# Patient Record
Sex: Male | Born: 1960 | Race: Black or African American | Hispanic: No | Marital: Single | Smoking: Current some day smoker
Health system: Southern US, Community
[De-identification: ages and names within clinical notes are randomized; demographics above are authoritative.]

## PROBLEM LIST (undated history)

## (undated) DIAGNOSIS — R040 Epistaxis: Secondary | ICD-10-CM

## (undated) DIAGNOSIS — F32A Depression, unspecified: Secondary | ICD-10-CM

## (undated) DIAGNOSIS — N182 Chronic kidney disease, stage 2 (mild): Secondary | ICD-10-CM

## (undated) DIAGNOSIS — I10 Essential (primary) hypertension: Secondary | ICD-10-CM

## (undated) DIAGNOSIS — I422 Other hypertrophic cardiomyopathy: Secondary | ICD-10-CM

## (undated) DIAGNOSIS — M1990 Unspecified osteoarthritis, unspecified site: Secondary | ICD-10-CM

## (undated) HISTORY — DX: Unspecified osteoarthritis, unspecified site: M19.90

## (undated) HISTORY — DX: Chronic kidney disease, stage 2 (mild): N18.2

## (undated) HISTORY — DX: Epistaxis: R04.0

## (undated) HISTORY — DX: Depression, unspecified: F32.A

## (undated) HISTORY — DX: Essential (primary) hypertension: I10

## (undated) HISTORY — DX: Other hypertrophic cardiomyopathy: I42.2

## (undated) HISTORY — PX: NO PAST SURGERIES: SHX2092

---

## 2018-02-04 ENCOUNTER — Telehealth: Payer: Self-pay

## 2018-02-04 ENCOUNTER — Ambulatory Visit: Payer: Self-pay | Admitting: Internal Medicine

## 2018-02-04 ENCOUNTER — Encounter: Payer: Self-pay | Admitting: Internal Medicine

## 2018-02-04 VITALS — BP 185/111 | HR 69 | Wt 188.4 lb

## 2018-02-04 DIAGNOSIS — I10 Essential (primary) hypertension: Secondary | ICD-10-CM

## 2018-02-04 MED ORDER — HYDROCHLOROTHIAZIDE 25 MG PO TABS: 25.0000 mg | ORAL_TABLET | Freq: Every day | ORAL | 3 refills | Status: DC

## 2018-02-04 NOTE — Telephone Encounter (Signed)
Received email from [employer] [facility] alerting us that patient has a BP of 210/140.  Asked it we could work patient in.  [facility] Anaracy aware and patient notified to come in for an appointment.

## 2018-02-04 NOTE — Progress Notes (Signed)
   Subjective:    Patient ID: Edwin BaileyDaniel Frisch, male    DOB: 11/03/1961, 57 y.o.   MRN: [id number]  HPI   Pt has a personal and family history of high BP (his mother has high BP and his father died from a brain aneurysm). He is here for a f/u on high BP reading last night.  Pt has had surgery on both wrists due to injuries from his employment at [employer]. He also had a lymph node in this throat removed (cause is unknown).   Allergies as of 02/04/2018   No Known Allergies     Medication List    as of 02/04/2018 10:06 AM   You have not been prescribed any medications.    There are no active problems to display for this patient.    Review of Systems     Objective:   Physical Exam  Constitutional: He is oriented to person, place, and time.  Cardiovascular: Normal rate, regular rhythm and normal heart sounds.  Pulmonary/Chest: Effort normal and breath sounds normal.  Neurological: He is alert and oriented to person, place, and time.    BP (!) 185/111   Pulse 69   Wt 188 lb 6.4 oz (85.5 kg)       Assessment & Plan:   Suggested to pt that he should take medication for high BP. He is willing to take medication and hydrochlorothiazide was prescribed.   Labs ordered today: CMET, CBC, UA, TSH, lipid panel)  F/u in 6 weeks for medication and labs.

## 2018-02-05 LAB — LIPID PANEL
CHOL/HDL RATIO: 2.6 ratio (ref 0.0–5.0)
Cholesterol, Total: 171 mg/dL (ref 100–199)
HDL: 66 mg/dL (ref >39)
LDL CALC: 96 mg/dL (ref 0–99)
Triglycerides: 43 mg/dL (ref 0–149)
VLDL Cholesterol Cal: 9 mg/dL (ref 5–40)

## 2018-02-05 LAB — COMPREHENSIVE METABOLIC PANEL
ALBUMIN: 4.6 g/dL (ref 3.5–5.5)
ALT: 13 IU/L (ref 0–44)
AST: 16 IU/L (ref 0–40)
Albumin/Globulin Ratio: 1.9 (ref 1.2–2.2)
Alkaline Phosphatase: 63 IU/L (ref 39–117)
BUN / CREAT RATIO: 12 (ref 9–20)
BUN: 16 mg/dL (ref 6–24)
Bilirubin Total: 0.4 mg/dL (ref 0.0–1.2)
CO2: 26 mmol/L (ref 20–29)
CREATININE: 1.29 mg/dL — AB (ref 0.76–1.27)
Calcium: 9.2 mg/dL (ref 8.7–10.2)
Chloride: 104 mmol/L (ref 96–106)
GFR calc non Af Amer: 62 mL/min/{1.73_m2} (ref >59)
GFR, EST AFRICAN AMERICAN: 72 mL/min/{1.73_m2} (ref >59)
GLOBULIN, TOTAL: 2.4 g/dL (ref 1.5–4.5)
GLUCOSE: 96 mg/dL (ref 65–99)
Potassium: 4.8 mmol/L (ref 3.5–5.2)
SODIUM: 141 mmol/L (ref 134–144)
TOTAL PROTEIN: 7 g/dL (ref 6.0–8.5)

## 2018-02-05 LAB — CBC
HEMATOCRIT: 40.1 % (ref 37.5–51.0)
Hemoglobin: 13.5 g/dL (ref 13.0–17.7)
MCH: 30.4 pg (ref 26.6–33.0)
MCHC: 33.7 g/dL (ref 31.5–35.7)
MCV: 90 fL (ref 79–97)
PLATELETS: 218 10*3/uL (ref 150–379)
RBC: 4.44 x10E6/uL (ref 4.14–5.80)
RDW: 14.7 % (ref 12.3–15.4)
WBC: 4.1 10*3/uL (ref 3.4–10.8)

## 2018-02-05 LAB — URINALYSIS
Bilirubin, UA: NEGATIVE
GLUCOSE, UA: NEGATIVE
Ketones, UA: NEGATIVE
LEUKOCYTES UA: NEGATIVE
Nitrite, UA: NEGATIVE
PROTEIN UA: NEGATIVE
RBC, UA: NEGATIVE
Specific Gravity, UA: 1.015 (ref 1.005–1.030)
Urobilinogen, Ur: 0.2 mg/dL (ref 0.2–1.0)
pH, UA: 6 (ref 5.0–7.5)

## 2018-02-05 LAB — TSH: TSH: 2.9 u[IU]/mL (ref 0.450–4.500)

## 2018-02-16 ENCOUNTER — Ambulatory Visit: Payer: Self-pay | Admitting: Pharmacy Technician

## 2018-02-16 DIAGNOSIS — Z79899 Other long term (current) drug therapy: Secondary | ICD-10-CM

## 2018-02-16 NOTE — Progress Notes (Signed)
Completed [facility] application and contract.  Patient agreed to all terms of the [facility] contract.    Patient approved to receive medication assistance at [facility] through 2019, as long as eligibility criteria continues to be met.    Provided patient with Civil engineer, contracting based on his particular needs.    Patient inquiring about [facility]'s ability to fill narcotics.  Explained that [facility] cannot fill or dispense narcotics or controlled substances.  Patient acknowledged that he understood.  Patient asked that I contact [place] about prescribing 800 mg Aspirin.  Sending note to [facility].   [facility]

## 2018-03-18 ENCOUNTER — Ambulatory Visit: Payer: Self-pay | Admitting: Internal Medicine

## 2018-03-19 ENCOUNTER — Ambulatory Visit: Payer: Self-pay | Admitting: Adult Health

## 2018-03-19 ENCOUNTER — Encounter: Payer: Self-pay | Admitting: Adult Health

## 2018-03-19 VITALS — BP 200/117 | HR 65 | Temp 98.2°F | Ht 73.0 in | Wt 196.4 lb

## 2018-03-19 DIAGNOSIS — I10 Essential (primary) hypertension: Secondary | ICD-10-CM

## 2018-03-19 DIAGNOSIS — N182 Chronic kidney disease, stage 2 (mild): Secondary | ICD-10-CM

## 2018-03-19 DIAGNOSIS — F17200 Nicotine dependence, unspecified, uncomplicated: Secondary | ICD-10-CM

## 2018-03-19 DIAGNOSIS — F17209 Nicotine dependence, unspecified, with unspecified nicotine-induced disorders: Secondary | ICD-10-CM

## 2018-03-19 DIAGNOSIS — I129 Hypertensive chronic kidney disease with stage 1 through stage 4 chronic kidney disease, or unspecified chronic kidney disease: Secondary | ICD-10-CM

## 2018-03-19 DIAGNOSIS — N181 Chronic kidney disease, stage 1: Secondary | ICD-10-CM

## 2018-03-19 MED ORDER — HYDROCHLOROTHIAZIDE 50 MG PO TABS: 50.0000 mg | ORAL_TABLET | Freq: Every day | ORAL | 3 refills | Status: DC

## 2018-03-19 MED ORDER — LISINOPRIL 20 MG PO TABS: 20.0000 mg | ORAL_TABLET | Freq: Every day | ORAL | 3 refills | Status: DC

## 2018-03-19 NOTE — Progress Notes (Signed)
  Patient: Edwin Green Male    DOB: 01/03/1961   57 y.o.   MRN: [id number] Visit Date: 03/19/2018  Today's Provider: Tally JoeMagddalene S Tukov, NP   Chief Complaint  Patient presents with  . Follow-up    hypertension  Uncontrolled hypertension Subjective:    HPI   This is a 57 y/o AA male who presents for evaluation of hypertension. His blood pressure is running high with readings >180/90. Today his systolic BP is in the 200s. He reports running out of his blood pressure medications 1 week ago but even when he was taking the medications, it was still high. He denies headache, dizziness, visual changes, nausea and vomiting. His last labs were unremarkable except for a creatinine level of 1.29. His GFR was normal. He reports consistent daily exercise and adherence to a heart healthy diet. He continue to smoke occasionally  No Known Allergies Previous Medications   HYDROCHLOROTHIAZIDE (HYDRODIURIL) 25 MG TABLET    Take 1 tablet (25 mg total) by mouth daily.   Social History   Tobacco Use  . Smoking status: Current Some Day Smoker    Types: Cigarettes  . Smokeless tobacco: Never Used  Substance Use Topics  . Alcohol use: Yes     Review of Systems  Constitutional: Negative.   HENT: Negative.   Eyes: Negative.   Respiratory: Negative.   Cardiovascular: Negative.   Skin: Negative.   Neurological: Negative.     Objective:   BP (!) 200/117   Pulse 65   Temp 98.2 F (36.8 C)   Ht 6\' 1"  (1.854 m)   Wt 196 lb 6.4 oz (89.1 kg)   BMI 25.91 kg/m   Physical Exam  Constitutional: He is oriented to person, place, and time. He appears well-developed and well-nourished.  HENT:  Mouth/Throat: Oropharynx is clear and moist.  Eyes: Pupils are equal, round, and reactive to light. Conjunctivae are normal.  Neck: Normal range of motion. Neck supple.  Cardiovascular: Normal rate, regular rhythm and normal heart sounds.  Pulmonary/Chest: Effort normal and breath sounds normal.  Abdominal:  Soft. Bowel sounds are normal.  Musculoskeletal: Normal range of motion.  Neurological: He is alert and oriented to person, place, and time.  Skin: Skin is warm and dry.      Assessment & Plan:     1. Uncontrolled hypertension: Will increase hydrochlorothiazide (HYDRODIURIL) to 50 MG daily to be taken in the morning and add lisinopril 20 mg QPM. Patient advised to stop lisinopril if cough, lips and tongue swelling occurs. Patient advised to avoid foods with >2g sodium and avoid added salt to meals. Continue daily exercise and adhere o prescribed medications and health healthy diet  2. Chronic kidney disease (CKD) stage G2/A1, mildly decreased glomerular filtration rate (GFR) between 60-89 mL/min/1.73 square meter and albuminuria creatinine ratio less than 30 mg/g  Patient is at increased risk for renal failure. Will optimize blood pressure control and treat creatinine and microalbuminuria.   3. Tobacco use disorder Smoking cessation advice given  RTC in 1 week for blood pressure re-check.  Labs in 6 months   Tally JoeMagddalene S Tukov, NP   [facility] [place]

## 2018-03-19 NOTE — Patient Instructions (Addendum)
Chronic Kidney Disease, Adult Chronic kidney disease (CKD) happens when the kidneys are damaged during a time of 3 or more months. The kidneys are two organs that do many important jobs in the body. These jobs include:  Removing wastes and extra fluids from the blood.  Making hormones that maintain the amount of fluid in your tissues and blood vessels.  Making sure that the body has the right amount of fluids and chemicals.  Most of the time, this condition does not go away, but it can usually be controlled. Steps must be taken to slow down the kidney damage or stop it from getting worse. Otherwise, the kidneys may stop working. Follow these instructions at home:  Follow your diet as told by your doctor. You may need to avoid alcohol, salty foods (sodium), and foods that are high in potassium, calcium, and protein.  Take over-the-counter and prescription medicines only as told by your doctor. Do not take any new medicines unless your doctor says you can do that. These include vitamins and minerals. ? Medicines and nutritional supplements can make kidney damage worse. ? Your doctor may need to change how much medicine you take.  Do not use any tobacco products. These include cigarettes, chewing tobacco, and e-cigarettes. If you need help quitting, ask your doctor.  Keep all follow-up visits as told by your doctor. This is important.  Check your blood pressure. Tell your doctor if there are changes to your blood pressure.  Get to a healthy weight. Stay at that weight. If you need help with this, ask your doctor.  Start or continue an exercise plan. Try to exercise at least 30 minutes a day, 5 days a week.  Stay up-to-date with your shots (immunizations) as told by your doctor. Contact a doctor if:  Your symptoms get worse.  You have new symptoms. Get help right away if:  You have symptoms of end-stage kidney disease. These include: ? Headaches. ? Skin that is darker or lighter  than normal. ? Numbness in your hands or feet. ? Easy bruising. ? Having hiccups often. ? Chest pain. ? Shortness of breath. ? Stopping of menstrual periods in women.  You have a fever.  You are making very little pee (urine).  You have pain or bleeding when you pee (urinate). This information is not intended to replace advice given to you by your health care provider. Make sure you discuss any questions you have with your health care provider. Document Released: 02/26/2010 Document Revised: 05/09/2016 Document Reviewed: 07/31/2012 Elsevier Interactive Patient Education  2017 [employer]. Hypertension Hypertension is another name for high blood pressure. High blood pressure forces your heart to work harder to pump blood. This can cause problems over time. There are two numbers in a blood pressure reading. There is a top number (systolic) over a bottom number (diastolic). It is best to have a blood pressure below 120/80. Healthy choices can help lower your blood pressure. You may need medicine to help lower your blood pressure if:  Your blood pressure cannot be lowered with healthy choices.  Your blood pressure is higher than 130/80.  Follow these instructions at home: Eating and drinking  If directed, follow the DASH eating plan. This diet includes: ? Filling half of your plate at each meal with fruits and vegetables. ? Filling one quarter of your plate at each meal with whole grains. Whole grains include whole wheat pasta, brown rice, and whole grain bread. ? Eating or drinking low-fat dairy products,  such as skim milk or low-fat yogurt. ? Filling one quarter of your plate at each meal with low-fat (lean) proteins. Low-fat proteins include fish, skinless chicken, eggs, beans, and tofu. ? Avoiding fatty meat, cured and processed meat, or chicken with skin. ? Avoiding premade or processed food.  Eat less than 1,500 mg of salt (sodium) a day.  Limit alcohol use to no more than 1  drink a day for nonpregnant women and 2 drinks a day for men. One drink equals 12 oz of beer, 5 oz of wine, or 1 oz of hard liquor. Lifestyle  Work with your doctor to stay at a healthy weight or to lose weight. Ask your doctor what the best weight is for you.  Get at least 30 minutes of exercise that causes your heart to beat faster (aerobic exercise) most days of the week. This may include walking, swimming, or biking.  Get at least 30 minutes of exercise that strengthens your muscles (resistance exercise) at least 3 days a week. This may include lifting weights or pilates.  Do not use any products that contain nicotine or tobacco. This includes cigarettes and e-cigarettes. If you need help quitting, ask your doctor.  Check your blood pressure at home as told by your doctor.  Keep all follow-up visits as told by your doctor. This is important. Medicines  Take over-the-counter and prescription medicines only as told by your doctor. Follow directions carefully.  Do not skip doses of blood pressure medicine. The medicine does not work as well if you skip doses. Skipping doses also puts you at risk for problems.  Ask your doctor about side effects or reactions to medicines that you should watch for. Contact a doctor if:  You think you are having a reaction to the medicine you are taking.  You have headaches that keep coming back (recurring).  You feel dizzy.  You have swelling in your ankles.  You have trouble with your vision. Get help right away if:  You get a very bad headache.  You start to feel confused.  You feel weak or numb.  You feel faint.  You get very bad pain in your: ? Chest. ? Belly (abdomen).  You throw up (vomit) more than once.  You have trouble breathing. Summary  Hypertension is another name for high blood pressure.  Making healthy choices can help lower blood pressure. If your blood pressure cannot be controlled with healthy choices, you may  need to take medicine. This information is not intended to replace advice given to you by your health care provider. Make sure you discuss any questions you have with your health care provider. Document Released: 05/20/2008 Document Revised: 10/30/2016 Document Reviewed: 10/30/2016 Elsevier Interactive Patient Education  [employer].

## 2018-03-26 ENCOUNTER — Ambulatory Visit: Payer: Self-pay | Admitting: Adult Health

## 2018-04-07 ENCOUNTER — Other Ambulatory Visit: Payer: Self-pay

## 2018-04-07 ENCOUNTER — Emergency Department
Admission: EM | Admit: 2018-04-07 | Discharge: 2018-04-07 | Disposition: A | Discharge status: Home / Self Care | Payer: Self-pay | Attending: Emergency Medicine | Admitting: Emergency Medicine

## 2018-04-07 ENCOUNTER — Emergency Department: Payer: Self-pay

## 2018-04-07 DIAGNOSIS — I10 Essential (primary) hypertension: Secondary | ICD-10-CM | POA: Insufficient documentation

## 2018-04-07 DIAGNOSIS — N289 Disorder of kidney and ureter, unspecified: Secondary | ICD-10-CM | POA: Insufficient documentation

## 2018-04-07 DIAGNOSIS — R55 Syncope and collapse: Secondary | ICD-10-CM | POA: Insufficient documentation

## 2018-04-07 DIAGNOSIS — F17210 Nicotine dependence, cigarettes, uncomplicated: Secondary | ICD-10-CM | POA: Insufficient documentation

## 2018-04-07 DIAGNOSIS — I959 Hypotension, unspecified: Secondary | ICD-10-CM | POA: Insufficient documentation

## 2018-04-07 LAB — BASIC METABOLIC PANEL
ANION GAP: 3 — AB (ref 5–15)
BUN: 23 mg/dL — ABNORMAL HIGH (ref 6–20)
CHLORIDE: 117 mmol/L — AB (ref 101–111)
CO2: 23 mmol/L (ref 22–32)
Calcium: 6.5 mg/dL — ABNORMAL LOW (ref 8.9–10.3)
Creatinine, Ser: 1.44 mg/dL — ABNORMAL HIGH (ref 0.61–1.24)
GFR, EST NON AFRICAN AMERICAN: 54 mL/min — AB (ref >60)
Glucose, Bld: 109 mg/dL — ABNORMAL HIGH (ref 65–99)
POTASSIUM: 3.5 mmol/L (ref 3.5–5.1)
Sodium: 143 mmol/L (ref 135–145)

## 2018-04-07 LAB — CBC
HEMATOCRIT: 38.5 % — AB (ref 40.0–52.0)
HEMOGLOBIN: 12.9 g/dL — AB (ref 13.0–18.0)
MCH: 30.4 pg (ref 26.0–34.0)
MCHC: 33.4 g/dL (ref 32.0–36.0)
MCV: 90.9 fL (ref 80.0–100.0)
Platelets: 145 10*3/uL — ABNORMAL LOW (ref 150–440)
RBC: 4.24 MIL/uL — ABNORMAL LOW (ref 4.40–5.90)
RDW: 14.4 % (ref 11.5–14.5)
WBC: 6.9 10*3/uL (ref 3.8–10.6)

## 2018-04-07 LAB — GLUCOSE, CAPILLARY: GLUCOSE-CAPILLARY: 106 mg/dL — AB (ref 65–99)

## 2018-04-07 MED ORDER — SODIUM CHLORIDE 0.9 % IV BOLUS
1000.0000 mL | Freq: Once | INTRAVENOUS | Status: AC
  Administered 2018-04-07: 1000 mL via INTRAVENOUS

## 2018-04-07 NOTE — Discharge Instructions (Addendum)
Please drink plenty of fluids to stay well-hydrated.  Please take all precautions to prevent fainting including slow movements from laying down to sitting and standing positions.  Please [facility]  Please have them recheck your kidney function.  Return to the emergency department if you develop severe pain, chest pain, shortness of breath, lightheadedness or fainting, fever, or any other symptoms concerning to you.

## 2018-04-07 NOTE — ED Triage Notes (Signed)
Pt arrived via EMS from home d/t syncopal episode after giving plasma today around 1530. Pt walked home after giving blood and began to feel dizzy and had a syncopal episode once he got home. Pt reports hitting the back of his head but denies ever losing complete consciousness. Pt is A&O x4 at this time.

## 2018-04-07 NOTE — ED Provider Notes (Signed)
[facility] Emergency Department Provider Note  ____________________________________________  Time seen: Approximately 6:52 PM  I have reviewed the triage vital signs and the nursing notes.   HISTORY  Chief Complaint Loss of Consciousness    HPI Edwin Green is a 57 y.o. male a history of hypertension presenting for syncope.  The patient reports that today is his third time donating plasma for the third week in a row.  Upon arrival home, the patient became lightheaded and had a brief loss of consciousness without any urinary or fecal incontinence are reported tonic-clonic movements.  He did fall backwards and struck his neck.  He denies any associated neck or back pain but states "I know something happened to my neck when I fell backwards."  He also reports chronic bilateral wrist pain which is unchanged and denies any injury from his fall.  He did not have any associated chest pain, palpitations, shortness of breath, and has not had any lower extremity swelling or calf pain.   Past Medical History:  Diagnosis Date  . Hypertension     There are no active problems to display for this patient.   History reviewed. No pertinent surgical history.  Current Outpatient Rx  . Order #: [id number] Class: Normal  . Order #: [id number] Class: Normal    Allergies Patient has no known allergies.  Family History  Problem Relation Age of Onset  . Hypertension Mother   . Stroke Father   . Hypertension Father   . Asthma Son     Social History Social History   Tobacco Use  . Smoking status: Current Some Day Smoker    Types: Cigarettes  . Smokeless tobacco: Never Used  Substance Use Topics  . Alcohol use: Yes  . Drug use: No    Review of Systems Constitutional: No fever/chills.  Positive lightheadedness and syncope. Eyes: No visual changes. ENT: No sore throat. No congestion or rhinorrhea. Cardiovascular: Denies chest pain. Denies  palpitations. Respiratory: Denies shortness of breath.  No cough. Gastrointestinal: No abdominal pain.  No nausea, no vomiting.  No diarrhea.  No constipation. Genitourinary: Negative for dysuria. Musculoskeletal: Negative for back pain.  Positive for possible neck injury without pain at this time.  Positive chronic unchanged bilateral wrist pain. Skin: Negative for rash. Neurological: Negative for headaches. No focal numbness, tingling or weakness.     ____________________________________________   PHYSICAL EXAM:  VITAL SIGNS: ED Triage Vitals  Enc Vitals Group     BP 04/07/18 1807 102/72     Pulse Rate 04/07/18 1807 73     Resp 04/07/18 1807 12     Temp 04/07/18 1807 97.7 F (36.5 C)     Temp Source 04/07/18 1807 Oral     SpO2 04/07/18 1807 98 %     Weight 04/07/18 1804 195 lb (88.5 kg)     Height 04/07/18 1804 6\' 1"  (1.854 m)     Head Circumference --      Peak Flow --      Pain Score 04/07/18 1804 8     Pain Loc --      Pain Edu? --      Excl. in GC? --     Constitutional: Alert and oriented. Well appearing and in no acute distress. Answers questions appropriately.  GCS is 15. Eyes: Conjunctivae are normal.  EOMI. No scleral icterus.  No raccoon eyes  head: Atraumatic.  No battle sign. Nose: No congestion/rhinnorhea.  No swelling over the nose.  No septal hematoma.  Mouth/Throat: Mucous membranes are moist.  No dental injury or malocclusion. Neck: No stridor.  Supple.  No midline C-spine tenderness to palpation, step-offs or deformities. Cardiovascular: Normal rate, regular rhythm. No murmurs, rubs or gallops.  Blood pressure in the high 90s over 60s on my examination. Respiratory: Normal respiratory effort.  No accessory muscle use or retractions. Lungs CTAB.  No wheezes, rales or ronchi. Gastrointestinal: Soft, nontender and nondistended.  No guarding or rebound.  No peritoneal signs. Musculoskeletal: No LE edema. No ttp in the calves or palpable cords.  Negative  Homan's sign. Neurologic:  A&Ox3.  Speech is clear.  Face and smile are symmetric.  EOMI.  Moves all extremities well. Skin:  Skin is warm, dry and intact. No rash noted. Psychiatric: Mood is normal and affect is bizarre.  Speech and behavior are normal.  }  ____________________________________________   LABS (all labs ordered are listed, but only abnormal results are displayed)  Labs Reviewed  CBC - Abnormal; Notable for the following components:      Result Value   RBC 4.24 (*)    Hemoglobin 12.9 (*)    HCT 38.5 (*)    Platelets 145 (*)    All other components within normal limits  BASIC METABOLIC PANEL - Abnormal; Notable for the following components:   Chloride 117 (*)    Glucose, Bld 109 (*)    BUN 23 (*)    Creatinine, Ser 1.44 (*)    Calcium 6.5 (*)    GFR calc non Af Amer 54 (*)    Anion gap 3 (*)    All other components within normal limits  GLUCOSE, CAPILLARY - Abnormal; Notable for the following components:   Glucose-Capillary 106 (*)    All other components within normal limits  CBG MONITORING, ED   ____________________________________________  EKG  ED ECG REPORT I, Rockne Menghini, the attending physician, personally viewed and interpreted this ECG.   Date: 04/07/2018  EKG Time: 1810  Rate: 73  Rhythm: normal sinus rhythm  Axis: normal  Intervals:none  ST&T Change: No STEMI  ____________________________________________  RADIOLOGY  Dg Chest 2 View  Result Date: 04/07/2018 CLINICAL DATA:  Syncopal episode. EXAM: CHEST - 2 VIEW COMPARISON:  None. FINDINGS: The heart size and mediastinal contours are within normal limits. Both lungs are clear. The visualized skeletal structures are unremarkable. IMPRESSION: No active cardiopulmonary disease. Electronically Signed   By: Gerome Sam III M.D   On: 04/07/2018 19:13   Dg Cervical Spine Complete  Result Date: 04/07/2018 CLINICAL DATA:  Pain after fall.  Syncope. EXAM: CERVICAL SPINE - COMPLETE 4+  VIEW COMPARISON:  None. FINDINGS: Evaluation is limited on lateral views as the cervical spine is only well seen to the level of the mid C7 level. The pre odontoid space and prevertebral soft tissues are normal. No fractures are seen. Multilevel degenerative changes with small anterior osteophytes. The neural foramina are patent. Limited views of the odontoid process are normal. No other acute abnormalities. IMPRESSION: The cervical spine is only seen to the mid C7 level. Within this limitation, there are mild degenerative changes but no fractures or traumatic malalignment identified. Electronically Signed   By: Gerome Sam III M.D   On: 04/07/2018 19:15    ____________________________________________   PROCEDURES  Procedure(s) performed: None  Procedures  Critical Care performed: No ____________________________________________   INITIAL IMPRESSION / ASSESSMENT AND PLAN / ED COURSE  Pertinent labs & imaging results that were available during my care of the patient were  reviewed by me and considered in my medical decision making (see chart for details).  57 y.o. male with a history of hypertension presenting with syncope after donating plasma today.  Overall, the patient is mildly hypotensive but otherwise hemodynamically stable.  He has no focal evidence of injury on my examination today but will get a x-ray of his neck given his concern about neck injury and chest x-ray for evaluation of his cardiopulmonary status.  An EKG has been performed and does not show any arrhythmia or ischemia.  Basic laboratory studies including blood work looking for anemia has been ordered.   ACS or MI, arrhythmia, PE, aortic pathology, or CVA are very unlikely.  The most likely etiology of the patient's symptoms is either vasovagal or hypovolemia.  We will get orthostatics and treat the patient with intravenous fluids.  Plan reevaluation for final disposition.  ----------------------------------------- 9:10  PM on 04/07/2018 -----------------------------------------  The patient continues to rest comfortably and is hemodynamically stable.  His workup in the emergency department has been reassuring.  He does have a mild renal insufficiency although at baseline he has an abnormal kidney function I have encouraged him to continue to drink plenty of fluid and have this rechecked with his primary care physician.  His trauma evaluation is negative for any cervical spine injury.  His chest x-ray is also reassuring.  His EKG does not show ischemic changes.  At this time, the patient is safe for discharge home.  Discussed return precautions as well as follow-up instructions with the patient  ____________________________________________  FINAL CLINICAL IMPRESSION(S) / ED DIAGNOSES  Final diagnoses:  Acute renal insufficiency  Syncope, unspecified syncope type         NEW MEDICATIONS STARTED DURING THIS VISIT:  New Prescriptions   No medications on file      Rockne Menghini, MD 04/07/18 2113

## 2019-02-24 IMAGING — CR DG CERVICAL SPINE COMPLETE 4+V
6 series · 6 of 6 positions shown · non-contrast
Comparison: None.

CLINICAL DATA: Pain after fall.  Syncope.

EXAM:
CERVICAL SPINE - COMPLETE 4+ VIEW

[c-spine lat]
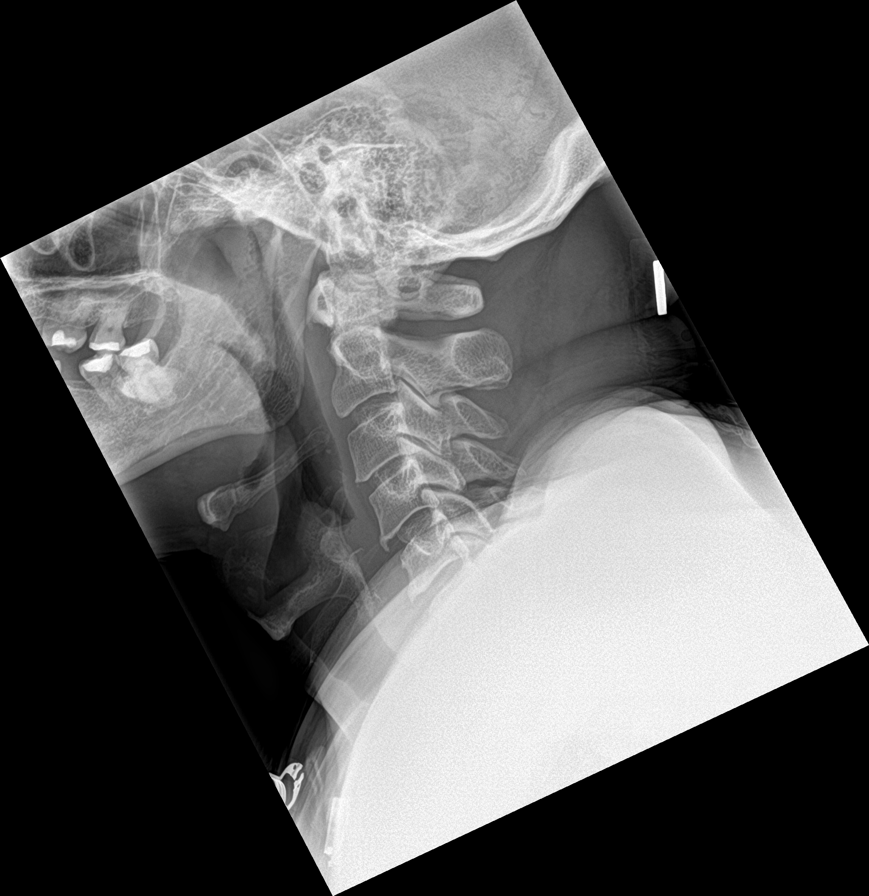

[c-spine obl (1 of 2)]
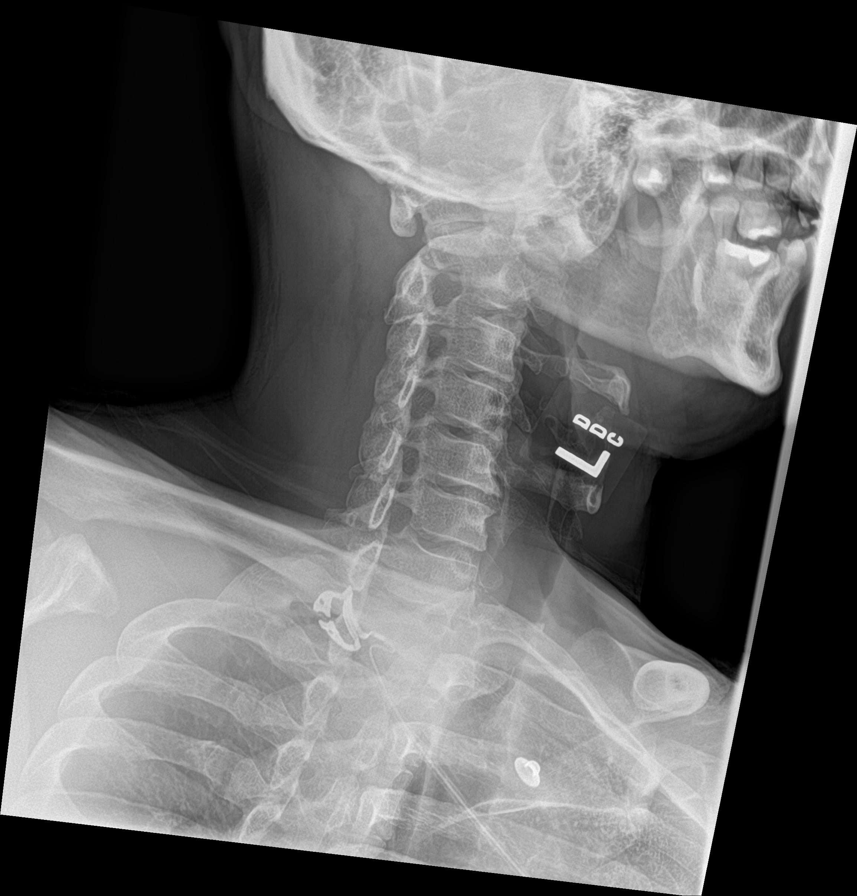

[c-spine obl (2 of 2)]
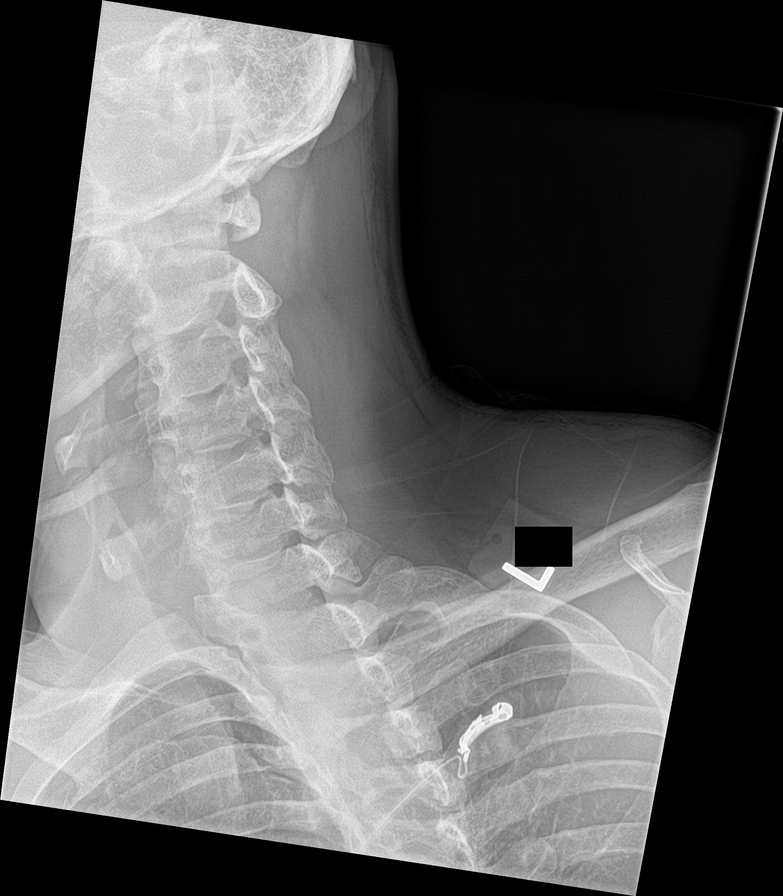

[c-spine ap]
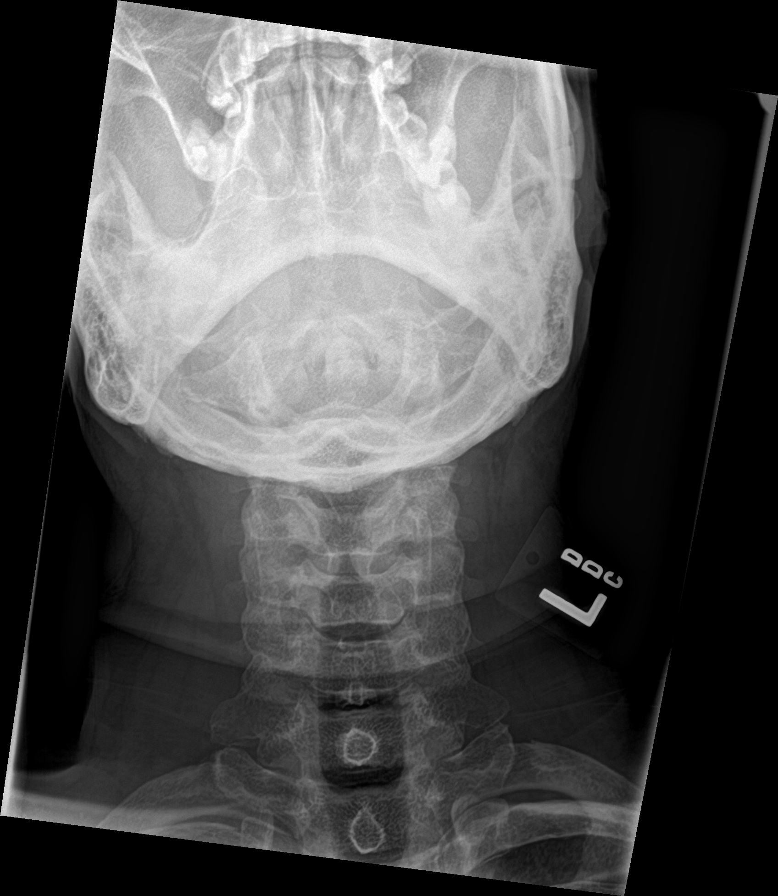

[c-spine open mouth]
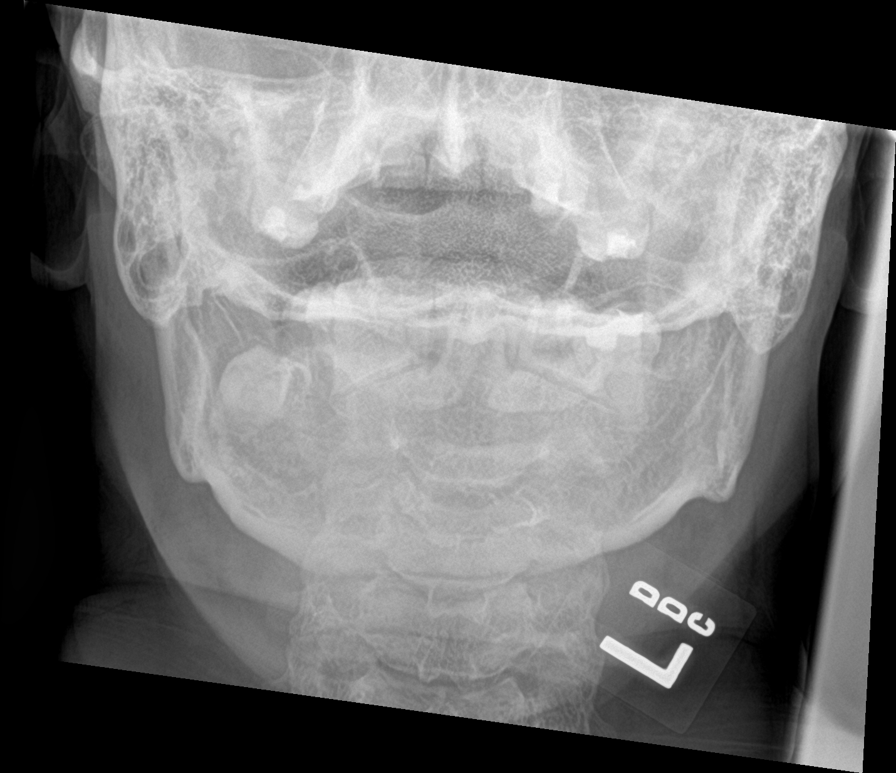

[c-spine swimmers]
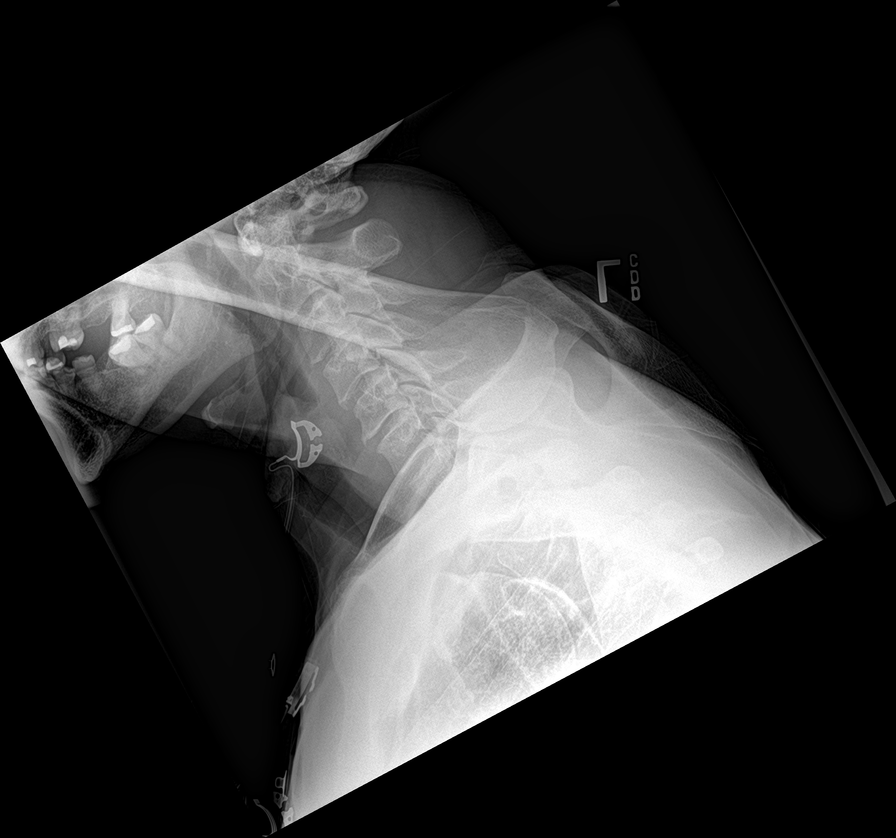

[6 of 6 positions shown; findings below may reference images not displayed]

FINDINGS: Evaluation is limited on lateral views as the cervical spine is only
well seen to the level of the mid C7 level. The pre odontoid space
and prevertebral soft tissues are normal. No fractures are seen.
Multilevel degenerative changes with small anterior osteophytes. The
neural foramina are patent. Limited views of the odontoid process
are normal. No other acute abnormalities.
IMPRESSION: The cervical spine is only seen to the mid C7 level. Within this
limitation, there are mild degenerative changes but no fractures or
traumatic malalignment identified.

## 2019-06-23 ENCOUNTER — Inpatient Hospital Stay
Admission: EM | Admit: 2019-06-23 | Discharge: 2019-06-26 | DRG: 305 | Disposition: A | Discharge status: Home / Self Care | Payer: No Typology Code available for payment source | Attending: Internal Medicine | Admitting: Internal Medicine

## 2019-06-23 ENCOUNTER — Other Ambulatory Visit: Payer: Self-pay

## 2019-06-23 ENCOUNTER — Emergency Department: Payer: No Typology Code available for payment source

## 2019-06-23 ENCOUNTER — Encounter: Payer: Self-pay | Admitting: Emergency Medicine

## 2019-06-23 DIAGNOSIS — Z8673 Personal history of transient ischemic attack (TIA), and cerebral infarction without residual deficits: Secondary | ICD-10-CM

## 2019-06-23 DIAGNOSIS — Z9114 Patient's other noncompliance with medication regimen: Secondary | ICD-10-CM

## 2019-06-23 DIAGNOSIS — I248 Other forms of acute ischemic heart disease: Secondary | ICD-10-CM | POA: Diagnosis present

## 2019-06-23 DIAGNOSIS — Z823 Family history of stroke: Secondary | ICD-10-CM

## 2019-06-23 DIAGNOSIS — E860 Dehydration: Secondary | ICD-10-CM | POA: Diagnosis present

## 2019-06-23 DIAGNOSIS — Z825 Family history of asthma and other chronic lower respiratory diseases: Secondary | ICD-10-CM

## 2019-06-23 DIAGNOSIS — E869 Volume depletion, unspecified: Secondary | ICD-10-CM | POA: Diagnosis present

## 2019-06-23 DIAGNOSIS — I160 Hypertensive urgency: Secondary | ICD-10-CM | POA: Diagnosis not present

## 2019-06-23 DIAGNOSIS — Z8249 Family history of ischemic heart disease and other diseases of the circulatory system: Secondary | ICD-10-CM

## 2019-06-23 DIAGNOSIS — R040 Epistaxis: Secondary | ICD-10-CM | POA: Diagnosis not present

## 2019-06-23 DIAGNOSIS — I421 Obstructive hypertrophic cardiomyopathy: Secondary | ICD-10-CM | POA: Diagnosis present

## 2019-06-23 DIAGNOSIS — F419 Anxiety disorder, unspecified: Secondary | ICD-10-CM | POA: Diagnosis present

## 2019-06-23 DIAGNOSIS — R55 Syncope and collapse: Secondary | ICD-10-CM | POA: Diagnosis present

## 2019-06-23 DIAGNOSIS — I10 Essential (primary) hypertension: Secondary | ICD-10-CM | POA: Diagnosis present

## 2019-06-23 DIAGNOSIS — N179 Acute kidney failure, unspecified: Secondary | ICD-10-CM | POA: Diagnosis present

## 2019-06-23 DIAGNOSIS — F17210 Nicotine dependence, cigarettes, uncomplicated: Secondary | ICD-10-CM | POA: Diagnosis present

## 2019-06-23 DIAGNOSIS — Z20828 Contact with and (suspected) exposure to other viral communicable diseases: Secondary | ICD-10-CM | POA: Diagnosis present

## 2019-06-23 LAB — BASIC METABOLIC PANEL
Anion gap: 8 (ref 5–15)
BUN: 24 mg/dL — ABNORMAL HIGH (ref 6–20)
CO2: 24 mmol/L (ref 22–32)
Calcium: 8.6 mg/dL — ABNORMAL LOW (ref 8.9–10.3)
Chloride: 108 mmol/L (ref 98–111)
Creatinine, Ser: 1.45 mg/dL — ABNORMAL HIGH (ref 0.61–1.24)
GFR calc Af Amer: >60 mL/min (ref >60)
GFR calc non Af Amer: 54 mL/min — ABNORMAL LOW (ref >60)
Glucose, Bld: 138 mg/dL — ABNORMAL HIGH (ref 70–99)
Potassium: 3.7 mmol/L (ref 3.5–5.1)
Sodium: 140 mmol/L (ref 135–145)

## 2019-06-23 LAB — CBC WITH DIFFERENTIAL/PLATELET
Abs Immature Granulocytes: 0.01 10*3/uL (ref 0.00–0.07)
Basophils Absolute: 0.1 10*3/uL (ref 0.0–0.1)
Basophils Relative: 1 %
Eosinophils Absolute: 0.2 10*3/uL (ref 0.0–0.5)
Eosinophils Relative: 4 %
HCT: 37.1 % — ABNORMAL LOW (ref 39.0–52.0)
Hemoglobin: 12.4 g/dL — ABNORMAL LOW (ref 13.0–17.0)
Immature Granulocytes: 0 %
Lymphocytes Relative: 36 %
Lymphs Abs: 1.7 10*3/uL (ref 0.7–4.0)
MCH: 30.4 pg (ref 26.0–34.0)
MCHC: 33.4 g/dL (ref 30.0–36.0)
MCV: 90.9 fL (ref 80.0–100.0)
Monocytes Absolute: 0.5 10*3/uL (ref 0.1–1.0)
Monocytes Relative: 10 %
Neutro Abs: 2.3 10*3/uL (ref 1.7–7.7)
Neutrophils Relative %: 49 %
Platelets: 196 10*3/uL (ref 150–400)
RBC: 4.08 MIL/uL — ABNORMAL LOW (ref 4.22–5.81)
RDW: 13.7 % (ref 11.5–15.5)
WBC: 4.7 10*3/uL (ref 4.0–10.5)
nRBC: 0 % (ref 0.0–0.2)

## 2019-06-23 MED ORDER — HYDRALAZINE HCL 20 MG/ML IJ SOLN
10.0000 mg | Freq: Once | INTRAMUSCULAR | Status: AC
  Administered 2019-06-23: 10 mg via INTRAVENOUS
  Filled 2019-06-23: qty 1

## 2019-06-23 NOTE — ED Provider Notes (Signed)
[facility] Emergency Department Provider Note   ____________________________________________   First MD Initiated Contact with Patient 06/23/19 2328     (approximate)  I have reviewed the triage vital signs and the nursing notes.   HISTORY  Chief Complaint Hypertension and Epistaxis    HPI Edwin Green is a 58 y.o. male brought to the ED from work at [employer] via EMS with a chief complaint of hypertension and epistaxis.  Patient has a history of hypertension but has not taken his medicines in over a year because he felt like they were not working.  He is not prone to nosebleeds.  Reports 3 episodes of left-sided epistaxis today lasting approximately 20 minutes each and controlled by direct pressure.  Denies headache, fever, cough, chest pain, shortness of breath, abdominal pain, nausea or vomiting.  Denies anticoagulant use.  Denies recent travel, trauma or exposure to persons diagnosed with coronavirus.  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       Past Medical History:  Diagnosis Date  . Hypertension     Patient Active Problem List   Diagnosis Date Noted  . Accelerated hypertension 06/24/2019  . Nosebleed 06/24/2019  . Hypertensive urgency 06/24/2019    Past Surgical History:  Procedure Laterality Date  . NO PAST SURGERIES      Prior to Admission medications   Medication Sig Start Date End Date Taking? Authorizing Provider  hydrochlorothiazide (HYDRODIURIL) 50 MG tablet Take 1 tablet (50 mg total) by mouth daily. Patient not taking: Reported on 06/24/2019 03/19/18   Tukov-Yual, Alroy BailiffMagdalene S, NP  lisinopril (PRINIVIL,ZESTRIL) 20 MG tablet Take 1 tablet (20 mg total) by mouth daily. 03/19/18 04/18/18  Andreas Ohmukov-Yual, Magdalene S, NP    Allergies Patient has no known allergies.  Family History  Problem Relation Age of Onset  . Hypertension Mother   . Stroke Father   .  Hypertension Father   . Asthma Son     Social History Social History   Tobacco Use  . Smoking status: Current Some Day Smoker    Types: Cigarettes  . Smokeless tobacco: Never Used  Substance Use Topics  . Alcohol use: Yes  . Drug use: No    Review of Systems  Constitutional: No fever/chills Eyes: No visual changes. ENT: Positive for nosebleed.  No sore throat. Cardiovascular: Denies chest pain. Respiratory: Denies shortness of breath. Gastrointestinal: No abdominal pain.  No nausea, no vomiting.  No diarrhea.  No constipation. Genitourinary: Negative for dysuria. Musculoskeletal: Negative for back pain. Skin: Negative for rash. Neurological: Negative for headaches, focal weakness or numbness.   ____________________________________________   PHYSICAL EXAM:  VITAL SIGNS: ED Triage Vitals  Enc Vitals Group     BP 06/23/19 2326 (!) 203/133     Pulse Rate 06/23/19 2326 80     Resp 06/23/19 2326 18     Temp 06/23/19 2326 98.3 F (36.8 C)     Temp Source 06/23/19 2326 Oral     SpO2 06/23/19 2326 100 %     Weight 06/23/19 2327 210 lb (95.3 kg)     Height 06/23/19 2327 6\' 1"  (1.854 m)     Head Circumference --      Peak Flow --      Pain Score 06/23/19 2327 0     Pain Loc --      Pain Edu? --      Excl. in GC? --  Constitutional: Alert and oriented. Well appearing and in no acute distress. Eyes: Conjunctivae are normal. PERRL. EOMI. Head: Atraumatic. Nose: No active bleeding.  Blood clot in left naris. Mouth/Throat: Mucous membranes are moist.  Oropharynx non-erythematous.  No blood in posterior oropharynx. Neck: No stridor.  No carotid bruits. Cardiovascular: Normal rate, regular rhythm. Grossly normal heart sounds.  Good peripheral circulation. Respiratory: Normal respiratory effort.  No retractions. Lungs CTAB. Gastrointestinal: Soft and nontender. No distention. No abdominal bruits. No CVA tenderness. Musculoskeletal: No lower extremity tenderness nor  edema.  No joint effusions. Neurologic:  Normal speech and language. No gross focal neurologic deficits are appreciated. No gait instability. Skin:  Skin is warm, dry and intact. No rash noted. Psychiatric: Mood and affect are normal. Speech and behavior are normal.  ____________________________________________   LABS (all labs ordered are listed, but only abnormal results are displayed)  Labs Reviewed  CBC WITH DIFFERENTIAL/PLATELET - Abnormal; Notable for the following components:      Result Value   RBC 4.08 (*)    Hemoglobin 12.4 (*)    HCT 37.1 (*)    All other components within normal limits  BASIC METABOLIC PANEL - Abnormal; Notable for the following components:   Glucose, Bld 138 (*)    BUN 24 (*)    Creatinine, Ser 1.45 (*)    Calcium 8.6 (*)    GFR calc non Af Amer 54 (*)    All other components within normal limits  SARS CORONAVIRUS 2 (HOSPITAL ORDER, [facility])  TROPONIN I (HIGH SENSITIVITY)  TROPONIN I (HIGH SENSITIVITY)   ____________________________________________  EKG  ED ECG REPORT I, Veronnica Hennings J, the attending physician, personally viewed and interpreted this ECG.   Date: 06/23/2019  EKG Time: 2335  Rate: 75  Rhythm: normal EKG, normal sinus rhythm  Axis: Normal  Intervals:none  ST&T Change: Nonspecific  ____________________________________________  RADIOLOGY  ED MD interpretation: No acute cardiopulmonary process  Official radiology report(s): Dg Chest Port 1 View  Result Date: 06/23/2019 CLINICAL DATA:  Hypertension. EXAM: PORTABLE CHEST 1 VIEW COMPARISON:  April 07, 2018 FINDINGS: The heart size and mediastinal contours are within normal limits. Both lungs are clear. The visualized skeletal structures are unremarkable. IMPRESSION: No active disease. Electronically Signed   By: Constance Holster M.D.   On: 06/23/2019 23:53    ____________________________________________   PROCEDURES  Procedure(s) performed  (including Critical Care):  .Epistaxis Management  Date/Time: 06/24/2019 12:00 AM Performed by: Paulette Blanch, MD Authorized by: Paulette Blanch, MD   Consent:    Consent obtained:  Verbal   Consent given by:  Patient   Risks discussed:  Pain and infection   Alternatives discussed:  No treatment Anesthesia (see MAR for exact dosages):    Anesthesia method:  Topical application   Topical anesthetic:  Lidocaine gel Procedure details:    Treatment site:  L anterior   Treatment method:  Merocel sponge   Treatment complexity:  Limited   Treatment episode: initial   Post-procedure details:    Assessment:  Bleeding stopped   Patient tolerance of procedure:  Tolerated well, no immediate complications   CRITICAL CARE Performed by: Paulette Blanch   Total critical care time: 45 minutes  Critical care time was exclusive of separately billable procedures and treating other patients.  Critical care was necessary to treat or prevent imminent or life-threatening deterioration.  Critical care was time spent personally by me on the following activities: development of treatment plan with patient  and/or surrogate as well as nursing, discussions with consultants, evaluation of patient's response to treatment, examination of patient, obtaining history from patient or surrogate, ordering and performing treatments and interventions, ordering and review of laboratory studies, ordering and review of radiographic studies, pulse oximetry and re-evaluation of patient's condition.  ____________________________________________   INITIAL IMPRESSION / ASSESSMENT AND PLAN / ED COURSE  As part of my medical decision making, I reviewed the following data within the electronic MEDICAL RECORD NUMBER Nursing notes reviewed and incorporated, Labs reviewed, EKG interpreted, Old chart reviewed, Radiograph reviewed, Discussed with admitting physician and Notes from prior ED visits     Garrus Strausser was evaluated in  [facility] on 06/24/2019 for the symptoms described in the history of present illness. He was evaluated in the context of the global COVID-19 pandemic, which necessitated consideration that the patient might be at risk for infection with the SARS-CoV-2 virus that causes COVID-19. Institutional protocols and algorithms that pertain to the evaluation of patients at risk for COVID-19 are in a state of rapid change based on information released by regulatory bodies including the CDC and federal and state organizations. These policies and algorithms were followed during the patient's care in the ED.   58 year old male with hypertension not on medicines who presents with recurrent left-sided nosebleed.  Differential diagnosis includes but is not limited to hypertensive urgency, endorgan damage, metabolic, infectious etiologies, etc.  Nose is currently not bleeding.  Will obtain basic lab work, EKG, chest x-ray.  Administer IV hydralazine to start.  Anticipate hospitalization for hypertensive urgency so will obtain COVID swab.   Clinical Course as of Jun 24 347  Thu Jun 24, 2019  0000 Slow trickle from left naris.  Patient was packed with Merocel lathered and viscous lidocaine and expanded with small amount of Neo-Synephrine.  Currently not bleeding and laying back with ice pack over his nasal bridge.  No blood in posterior oropharynx.   [JS]  0019 DBP has come down to 120.  Will administer second dose IV hydralazine.  Nose is not bleeding and patient is resting.   [JS]  0107 Discussed case with hospitalist Dr. Anne HahnWillis.  If patient remains extremely hypertensive after second dose of hydralazine, will try Labetalol.   [JS]    Clinical Course User Index [JS] Irean HongSung, Avana Kreiser J, MD     ____________________________________________   FINAL CLINICAL IMPRESSION(S) / ED DIAGNOSES  Final diagnoses:  Epistaxis  Hypertensive urgency     ED Discharge Orders    None       Note:  This document was  prepared using Dragon voice recognition software and may include unintentional dictation errors.   Irean HongSung, Doil Kamara J, MD 06/24/19 [phone]

## 2019-06-23 NOTE — ED Triage Notes (Signed)
Patient coming EMS from work. Patient states this last nose bleed last 20 min with around 3 episodes today. Patient has hx of high blood pressure and has been on medicine in the past but not currently on anything and has been trying to control it naturally. Patient BP was 218/116 with EMS

## 2019-06-24 ENCOUNTER — Inpatient Hospital Stay: Payer: No Typology Code available for payment source

## 2019-06-24 ENCOUNTER — Other Ambulatory Visit: Payer: Self-pay

## 2019-06-24 ENCOUNTER — Encounter: Payer: Self-pay | Admitting: Internal Medicine

## 2019-06-24 DIAGNOSIS — F17210 Nicotine dependence, cigarettes, uncomplicated: Secondary | ICD-10-CM | POA: Diagnosis present

## 2019-06-24 DIAGNOSIS — Z823 Family history of stroke: Secondary | ICD-10-CM | POA: Diagnosis not present

## 2019-06-24 DIAGNOSIS — R079 Chest pain, unspecified: Secondary | ICD-10-CM | POA: Diagnosis not present

## 2019-06-24 DIAGNOSIS — Z8249 Family history of ischemic heart disease and other diseases of the circulatory system: Secondary | ICD-10-CM | POA: Diagnosis not present

## 2019-06-24 DIAGNOSIS — N179 Acute kidney failure, unspecified: Secondary | ICD-10-CM | POA: Diagnosis present

## 2019-06-24 DIAGNOSIS — E860 Dehydration: Secondary | ICD-10-CM | POA: Diagnosis present

## 2019-06-24 DIAGNOSIS — I10 Essential (primary) hypertension: Secondary | ICD-10-CM | POA: Diagnosis present

## 2019-06-24 DIAGNOSIS — R040 Epistaxis: Secondary | ICD-10-CM | POA: Diagnosis present

## 2019-06-24 DIAGNOSIS — Z20828 Contact with and (suspected) exposure to other viral communicable diseases: Secondary | ICD-10-CM | POA: Diagnosis present

## 2019-06-24 DIAGNOSIS — R55 Syncope and collapse: Secondary | ICD-10-CM | POA: Diagnosis present

## 2019-06-24 DIAGNOSIS — Z825 Family history of asthma and other chronic lower respiratory diseases: Secondary | ICD-10-CM | POA: Diagnosis not present

## 2019-06-24 DIAGNOSIS — I160 Hypertensive urgency: Secondary | ICD-10-CM | POA: Diagnosis present

## 2019-06-24 DIAGNOSIS — E869 Volume depletion, unspecified: Secondary | ICD-10-CM | POA: Diagnosis present

## 2019-06-24 DIAGNOSIS — Z9114 Patient's other noncompliance with medication regimen: Secondary | ICD-10-CM | POA: Diagnosis not present

## 2019-06-24 DIAGNOSIS — I421 Obstructive hypertrophic cardiomyopathy: Secondary | ICD-10-CM | POA: Diagnosis present

## 2019-06-24 DIAGNOSIS — Z8673 Personal history of transient ischemic attack (TIA), and cerebral infarction without residual deficits: Secondary | ICD-10-CM | POA: Diagnosis not present

## 2019-06-24 DIAGNOSIS — F419 Anxiety disorder, unspecified: Secondary | ICD-10-CM | POA: Diagnosis present

## 2019-06-24 DIAGNOSIS — I248 Other forms of acute ischemic heart disease: Secondary | ICD-10-CM | POA: Diagnosis present

## 2019-06-24 LAB — BASIC METABOLIC PANEL
Anion gap: 8 (ref 5–15)
BUN: 18 mg/dL (ref 6–20)
CO2: 25 mmol/L (ref 22–32)
Calcium: 8.9 mg/dL (ref 8.9–10.3)
Chloride: 107 mmol/L (ref 98–111)
Creatinine, Ser: 1.13 mg/dL (ref 0.61–1.24)
GFR calc Af Amer: >60 mL/min (ref >60)
GFR calc non Af Amer: >60 mL/min (ref >60)
Glucose, Bld: 119 mg/dL — ABNORMAL HIGH (ref 70–99)
Potassium: 3.4 mmol/L — ABNORMAL LOW (ref 3.5–5.1)
Sodium: 140 mmol/L (ref 135–145)

## 2019-06-24 LAB — CBC
HCT: 41.8 % (ref 39.0–52.0)
Hemoglobin: 13.7 g/dL (ref 13.0–17.0)
MCH: 30 pg (ref 26.0–34.0)
MCHC: 32.8 g/dL (ref 30.0–36.0)
MCV: 91.5 fL (ref 80.0–100.0)
Platelets: 195 10*3/uL (ref 150–400)
RBC: 4.57 MIL/uL (ref 4.22–5.81)
RDW: 13.6 % (ref 11.5–15.5)
WBC: 4.4 10*3/uL (ref 4.0–10.5)
nRBC: 0 % (ref 0.0–0.2)

## 2019-06-24 LAB — SARS CORONAVIRUS 2 BY RT PCR (HOSPITAL ORDER, PERFORMED IN ~~LOC~~ HOSPITAL LAB): SARS Coronavirus 2: NEGATIVE

## 2019-06-24 LAB — URINE DRUG SCREEN, QUALITATIVE (ARMC ONLY)
Amphetamines, Ur Screen: NOT DETECTED
Barbiturates, Ur Screen: NOT DETECTED
Benzodiazepine, Ur Scrn: NOT DETECTED
Cannabinoid 50 Ng, Ur ~~LOC~~: POSITIVE — AB
Cocaine Metabolite,Ur ~~LOC~~: NOT DETECTED
MDMA (Ecstasy)Ur Screen: NOT DETECTED
Methadone Scn, Ur: NOT DETECTED
Opiate, Ur Screen: NOT DETECTED
Phencyclidine (PCP) Ur S: NOT DETECTED
Tricyclic, Ur Screen: NOT DETECTED

## 2019-06-24 LAB — URINALYSIS, COMPLETE (UACMP) WITH MICROSCOPIC
Bacteria, UA: NONE SEEN
Bilirubin Urine: NEGATIVE
Glucose, UA: NEGATIVE mg/dL
Hgb urine dipstick: NEGATIVE
Ketones, ur: NEGATIVE mg/dL
Leukocytes,Ua: NEGATIVE
Nitrite: NEGATIVE
Protein, ur: NEGATIVE mg/dL
Specific Gravity, Urine: 1.005 (ref 1.005–1.030)
pH: 8 (ref 5.0–8.0)

## 2019-06-24 LAB — GLUCOSE, CAPILLARY: Glucose-Capillary: 90 mg/dL (ref 70–99)

## 2019-06-24 LAB — MRSA PCR SCREENING: MRSA by PCR: NEGATIVE

## 2019-06-24 LAB — TROPONIN I (HIGH SENSITIVITY)
Troponin I (High Sensitivity): 11 ng/L (ref <18)
Troponin I (High Sensitivity): 14 ng/L (ref <18)

## 2019-06-24 MED ORDER — HYDRALAZINE HCL 20 MG/ML IJ SOLN
10.0000 mg | Freq: Once | INTRAMUSCULAR | Status: AC
  Administered 2019-06-24: 10 mg via INTRAVENOUS
  Filled 2019-06-24: qty 1

## 2019-06-24 MED ORDER — CHLORHEXIDINE GLUCONATE CLOTH 2 % EX PADS
6.0000 | MEDICATED_PAD | Freq: Every day | CUTANEOUS | Status: DC
  Administered 2019-06-24: 22:00:00 6 via TOPICAL

## 2019-06-24 MED ORDER — HYDRALAZINE HCL 20 MG/ML IJ SOLN
10.0000 mg | INTRAMUSCULAR | Status: DC | PRN
  Administered 2019-06-24 (×2): 10 mg via INTRAVENOUS
  Administered 2019-06-25: 20 mg via INTRAVENOUS
  Filled 2019-06-24 (×2): qty 1

## 2019-06-24 MED ORDER — CEPHALEXIN 500 MG PO CAPS
500.0000 mg | ORAL_CAPSULE | Freq: Three times a day (TID) | ORAL | Status: DC
  Administered 2019-06-24: 500 mg via ORAL
  Filled 2019-06-24 (×2): qty 1

## 2019-06-24 MED ORDER — NICARDIPINE HCL IN NACL 20-0.86 MG/200ML-% IV SOLN
3.0000 mg/h | INTRAVENOUS | Status: DC
  Administered 2019-06-24: 2.5 mg/h via INTRAVENOUS
  Filled 2019-06-24 (×2): qty 200

## 2019-06-24 MED ORDER — ONDANSETRON HCL 4 MG PO TABS: 4.0000 mg | ORAL_TABLET | Freq: Four times a day (QID) | ORAL | Status: DC | PRN

## 2019-06-24 MED ORDER — ACETAMINOPHEN 650 MG RE SUPP: 650.0000 mg | Freq: Four times a day (QID) | RECTAL | Status: DC | PRN

## 2019-06-24 MED ORDER — ACETAMINOPHEN 325 MG PO TABS
650.0000 mg | ORAL_TABLET | Freq: Four times a day (QID) | ORAL | Status: DC | PRN
  Administered 2019-06-24 – 2019-06-25 (×6): 650 mg via ORAL
  Filled 2019-06-24 (×6): qty 2

## 2019-06-24 MED ORDER — HYDRALAZINE HCL 20 MG/ML IJ SOLN: 10.0000 mg | INTRAMUSCULAR | Status: DC | PRN

## 2019-06-24 MED ORDER — LISINOPRIL 20 MG PO TABS
20.0000 mg | ORAL_TABLET | Freq: Every day | ORAL | Status: DC
  Administered 2019-06-24 – 2019-06-26 (×3): 20 mg via ORAL
  Filled 2019-06-24 (×3): qty 1

## 2019-06-24 MED ORDER — ONDANSETRON HCL 4 MG/2ML IJ SOLN: 4.0000 mg | Freq: Four times a day (QID) | INTRAMUSCULAR | Status: DC | PRN

## 2019-06-24 MED ORDER — HYDROCHLOROTHIAZIDE 25 MG PO TABS
25.0000 mg | ORAL_TABLET | Freq: Every day | ORAL | Status: DC
  Administered 2019-06-24 – 2019-06-26 (×3): 25 mg via ORAL
  Filled 2019-06-24 (×3): qty 1

## 2019-06-24 MED ORDER — POTASSIUM CHLORIDE CRYS ER 20 MEQ PO TBCR
40.0000 meq | EXTENDED_RELEASE_TABLET | Freq: Once | ORAL | Status: AC
  Administered 2019-06-24: 40 meq via ORAL
  Filled 2019-06-24: qty 2

## 2019-06-24 MED ORDER — LABETALOL HCL 5 MG/ML IV SOLN
10.0000 mg | INTRAVENOUS | Status: DC | PRN
  Administered 2019-06-24 – 2019-06-25 (×2): 10 mg via INTRAVENOUS
  Filled 2019-06-24 (×2): qty 4

## 2019-06-24 MED ORDER — HYDRALAZINE HCL 20 MG/ML IJ SOLN
10.0000 mg | INTRAMUSCULAR | Status: DC | PRN
  Filled 2019-06-24: qty 1

## 2019-06-24 NOTE — Consult Note (Addendum)
Name: Edwin Green MRN: [id number] DOB: [birth year]    ADMISSION DATE:  06/23/2019 CONSULTATION DATE:  06/24/2019  REFERRING MD :  Dr. Jannifer Franklin  CHIEF COMPLAINT:  Hypertension & Epistaxis  BRIEF PATIENT DESCRIPTION:  58 year old male admitted with hypertensive urgency (likely secondary to med noncompliance) requiring Nicardipine drip.  SIGNIFICANT EVENTS  7/9>> admission to stepdown  STUDIES:  Renal Ultrasound 7/9>>  CULTURES: SARS-CoV-2 PCR 7/9>> negative  ANTIBIOTICS: Keflex 7/9>>  HISTORY OF PRESENT ILLNESS:   Edwin Green is a 58 year old male with a past medical history notable for hypertension, stroke who presents to [facility] ED on 06/23/2019 with complaints of epistaxis.  He reports 3 episodes of left-sided epistaxis earlier today lasting approximately 20 minutes each, which was controlled by direct pressure.  He denied headache, chest pain, shortness of breath, abdominal pain, nausea or vomiting, fever, or sick contacts.  He has a history of hypertension but reports he has not taken his blood pressure medicine in over a year because he felt like they were not working and were dehydrating him.  Upon presentation to ED his systolic blood pressure was in the 200s with DBP in the 130s.  He required packing of his nosebleed in the ED, and he was given multiple antihypertensives without significant effect on his blood pressure.  Initial work-up in the ED reveals creatinine 1.45, glucose 138, hemoglobin 12.4, otherwise all other lab work unremarkable.  His troponin is negative.  Chest x-ray is negative.  His SARS-CoV-2 PCR is negative.  He is being admitted to [facility] stepdown unit for further work-up and treatment of hypertensive urgency requiring nicardipine drip.  PCCM is consulted for further management.  PAST MEDICAL HISTORY :   has a past medical history of Hypertension.  has a past surgical history that includes No past surgeries. Prior to Admission medications   Medication Sig Start  Date End Date Taking? Authorizing Provider  hydrochlorothiazide (HYDRODIURIL) 50 MG tablet Take 1 tablet (50 mg total) by mouth daily. Patient not taking: Reported on 06/24/2019 03/19/18   Tukov-Yual, Arlyss Gandy, NP  lisinopril (PRINIVIL,ZESTRIL) 20 MG tablet Take 1 tablet (20 mg total) by mouth daily. 03/19/18 04/18/18  Erlene Quan, NP   No Known Allergies  FAMILY HISTORY:  family history includes Asthma in his son; Hypertension in his father and mother; Stroke in his father. SOCIAL HISTORY:  reports that he has been smoking cigarettes. He has never used smokeless tobacco. He reports current alcohol use. He reports that he does not use drugs.   COVID-19 DISASTER DECLARATION:  FULL CONTACT PHYSICAL EXAMINATION WAS NOT POSSIBLE DUE TO TREATMENT OF COVID-19 AND  CONSERVATION OF PERSONAL PROTECTIVE EQUIPMENT, LIMITED EXAM FINDINGS INCLUDE-  Patient assessed or the symptoms described in the history of present illness.  In the context of the Global COVID-19 pandemic, which necessitated consideration that the patient might be at risk for infection with the SARS-CoV-2 virus that causes COVID-19, Institutional protocols and algorithms that pertain to the evaluation of patients at risk for COVID-19 are in a state of rapid change based on information released by regulatory bodies including the CDC and federal and state organizations. These policies and algorithms were followed during the patient's care while in hospital.  REVIEW OF SYSTEMS: Positives in bold Constitutional: Negative for fever, chills, weight loss, malaise/fatigue and diaphoresis.  HENT: Negative for hearing loss, ear pain, +nosebleeds, congestion, sore throat, neck pain, tinnitus and ear discharge.   Eyes: Negative for blurred vision, double vision, photophobia, pain, discharge and redness.  Respiratory: Negative for cough, hemoptysis, sputum production, shortness of breath, wheezing and stridor.   Cardiovascular: Negative for  chest pain, palpitations, orthopnea, claudication, leg swelling and PND.  Gastrointestinal: Negative for heartburn, nausea, vomiting, abdominal pain, diarrhea, constipation, blood in stool and melena.  Genitourinary: Negative for dysuria, urgency, frequency, hematuria and flank pain.  Musculoskeletal: Negative for myalgias, back pain, joint pain and falls.  Skin: Negative for itching and rash.  Neurological: Negative for dizziness, tingling, tremors, sensory change, speech change, focal weakness, seizures, loss of consciousness, weakness and headaches.  Endo/Heme/Allergies: Negative for environmental allergies and polydipsia. Does not bruise/bleed easily.  SUBJECTIVE:  -Patient's report nosebleed has resolved with packing -He denies headache, blurred vision, chest pain, shortness of breath, abdominal pain, nausea vomiting, fever, chills -Reports he knows he needs to get his blood pressure under control  VITAL SIGNS: Temp:  [98.3 F (36.8 C)] 98.3 F (36.8 C) (07/08 2326) Pulse Rate:  [77-84] 79 (07/09 0239) Resp:  [14-20] 17 (07/09 0239) BP: (157-203)/(111-138) 193/133 (07/09 0239) SpO2:  [97 %-100 %] 100 % (07/09 0239) Weight:  [95.3 kg] 95.3 kg (07/08 2327)  PHYSICAL EXAMINATION: General: Acutely ill-appearing male, sitting in bed, on room air, no acute distress Neuro: Awake, alert and oriented x4, follows commands, no focal deficits, speech clear, pupils PERRLA HEENT: Atraumatic, normocephalic, neck supple, no JVD Cardiovascular: Regular rate and rhythm, S1-S2, no murmurs rubs or gallops, 2+ pulses throughout Lungs: Clear to auscultation bilaterally, even, nonlabored, normal effort Abdomen: Soft, nondistended, nontender, no guarding or rebound tenderness, bowel sounds positive x4 Musculoskeletal: No deformities, normal bulk and tone, muscle strength 5 out of 5 all extremities Skin: Warm and dry, no obvious rashes lesions or ulcerations  Recent Labs  Lab 06/23/19 2337  NA 140   K 3.7  CL 108  CO2 24  BUN 24*  CREATININE 1.45*  GLUCOSE 138*   Recent Labs  Lab 06/23/19 2337  HGB 12.4*  HCT 37.1*  WBC 4.7  PLT 196   Dg Chest Port 1 View  Result Date: 06/23/2019 CLINICAL DATA:  Hypertension. EXAM: PORTABLE CHEST 1 VIEW COMPARISON:  April 07, 2018 FINDINGS: The heart size and mediastinal contours are within normal limits. Both lungs are clear. The visualized skeletal structures are unremarkable. IMPRESSION: No active disease. Electronically Signed   By: Katherine Mantlehristopher  Green M.D.   On: 06/23/2019 23:53    ASSESSMENT / PLAN:  Hypertensive urgency, likely secondary to med noncompliance -Cardiac monitoring -PRN IV hydralazine and labetalol to maintain SBP <180 & DBP <100 -Nicardipine drip if unable to control with PRN's, titrate drip for SBP 160-180 -Restart previous home PO Lisinopril -Renal Ultrasound -Urine drug screen & Urinalysis pending  Epistaxis -Left nare packed, continue packing tonight    Disposition: Stepdown Goals of care: Full code VTE prophylaxis: SCD's Updates: Updated patient at bedside 06/24/2019  Harlon DittyJeremiah Keene, [facility] Hospital[facility]NP-BC [facility] Pager: [phone] Cell: [phone]  06/24/2019, 3:17 AM   PCCM ATTENDING ATTESTATION: I have evaluated patient with the APP Elvina SidleKeene, reviewed database in its entirety and discussed care plan in detail. In addition, this patient was discussed on multidisciplinary rounds. I have personally reviewed all chest radiographs discussed herein including CXRs and CT chest unless otherwise indicated.  I agree with the above findings, assessment and plan.  We will try to transition off of nicardipine infusion today. HCTZ and ACEI initiated 07/09. Cont PRN labetalol and hydralazine  Billy Fischeravid Simonds, MD PCCM service Mobile [phone] Pager [phone] 06/24/2019 1:08 PM

## 2019-06-24 NOTE — ED Notes (Signed)
ED TO INPATIENT HANDOFF REPORT  ED Nurse Name and Phone #:  Wells Guiles [phone]  S Name/Age/Gender Edwin Green 58 y.o. male Room/Bed: ED09A/ED09A  Code Status   Code Status: Not on file  Home/SNF/Other Home Patient oriented to: self, place, time and situation Is this baseline? Yes   Triage Complete: Triage complete  Chief Complaint [facility] - Epistaxis  Triage Note Patient coming EMS from work. Patient states this last nose bleed last 20 min with around 3 episodes today. Patient has hx of high blood pressure and has been on medicine in the past but not currently on anything and has been trying to control it naturally. Patient BP was 218/116 with EMS    Allergies No Known Allergies  Level of Care/Admitting Diagnosis ED Disposition    ED Disposition Condition Comment   Admit  The patient appears reasonably stabilized for admission considering the current resources, flow, and capabilities available in the ED at this time, and I doubt any other [facility] requiring further screening and/or treatment in the ED prior to admission is  present.       B Medical/Surgery History Past Medical History:  Diagnosis Date  . Hypertension    History reviewed. No pertinent surgical history.   A IV Location/Drains/Wounds Patient Lines/Drains/Airways Status   Active Line/Drains/Airways    Name:   Placement date:   Placement time:   Site:   Days:   Peripheral IV 06/23/19 Left Forearm   06/23/19    2340    Forearm   1          Intake/Output Last 24 hours No intake or output data in the 24 hours ending 06/24/19 0127  Labs/Imaging Results for orders placed or performed during the hospital encounter of 06/23/19 (from the past 48 hour(s))  CBC with Differential     Status: Abnormal   Collection Time: 06/23/19 11:37 PM  Result Value Ref Range   WBC 4.7 4.0 - 10.5 K/uL   RBC 4.08 (L) 4.22 - 5.81 MIL/uL   Hemoglobin 12.4 (L) 13.0 - 17.0 g/dL   HCT 37.1 (L) 39.0 - 52.0 %   MCV 90.9 80.0 -  100.0 fL   MCH 30.4 26.0 - 34.0 pg   MCHC 33.4 30.0 - 36.0 g/dL   RDW 13.7 11.5 - 15.5 %   Platelets 196 150 - 400 K/uL   nRBC 0.0 0.0 - 0.2 %   Neutrophils Relative % 49 %   Neutro Abs 2.3 1.7 - 7.7 K/uL   Lymphocytes Relative 36 %   Lymphs Abs 1.7 0.7 - 4.0 K/uL   Monocytes Relative 10 %   Monocytes Absolute 0.5 0.1 - 1.0 K/uL   Eosinophils Relative 4 %   Eosinophils Absolute 0.2 0.0 - 0.5 K/uL   Basophils Relative 1 %   Basophils Absolute 0.1 0.0 - 0.1 K/uL   Immature Granulocytes 0 %   Abs Immature Granulocytes 0.01 0.00 - 0.07 K/uL    Comment: Performed at [facility], [address] [place], [place] [address]  Basic metabolic panel     Status: Abnormal   Collection Time: 06/23/19 11:37 PM  Result Value Ref Range   Sodium 140 135 - 145 mmol/L   Potassium 3.7 3.5 - 5.1 mmol/L   Chloride 108 98 - 111 mmol/L   CO2 24 22 - 32 mmol/L   Glucose, Bld 138 (H) 70 - 99 mg/dL   BUN 24 (H) 6 - 20 mg/dL   Creatinine, Ser 1.45 (H)  0.61 - 1.24 mg/dL   Calcium 8.6 (L) 8.9 - 10.3 mg/dL   GFR calc non Af Amer 54 (L) >60 mL/min   GFR calc Af Amer >60 >60 mL/min   Anion gap 8 5 - 15    Comment: Performed at [facility], [address] [place], [place] [address]   Dg Chest Port 1 View  Result Date: 06/23/2019 CLINICAL DATA:  Hypertension. EXAM: PORTABLE CHEST 1 VIEW COMPARISON:  April 07, 2018 FINDINGS: The heart size and mediastinal contours are within normal limits. Both lungs are clear. The visualized skeletal structures are unremarkable. IMPRESSION: No active disease. Electronically Signed   By: Katherine Mantlehristopher  Green M.D.   On: 06/23/2019 23:53    Pending Labs Unresulted Labs (From admission, onward)    Start     Ordered   06/23/19 2336  Troponin I (High Sensitivity)  STAT Now then every 2 hours,   STAT     06/23/19 2335   06/23/19 2336  SARS Coronavirus 2 (CEPHEID - Performed [facility] Hosp Order  (Asymptomatic Patients Labs)  Once,   STAT     Question:  Rule Out  Answer:  Yes   06/23/19 2336          Vitals/Pain Today's Vitals   06/23/19 2349 06/24/19 0000 06/24/19 0030 06/24/19 0101  BP: (!) 197/138 (!) 179/120 (!) 157/133 (!) 180/115  Pulse:  82 82 77  Resp:  20 16 14   Temp:      TempSrc:      SpO2:  98% 99% 98%  Weight:      Height:      PainSc:        Isolation Precautions No active isolations  Medications Medications  hydrALAZINE (APRESOLINE) injection 10 mg (10 mg Intravenous Given 06/23/19 2349)  hydrALAZINE (APRESOLINE) injection 10 mg (10 mg Intravenous Given 06/24/19 0115)    Mobility walks Low fall risk   Focused Assessments Cardiac Assessment Handoff:    No results found for: CKTOTAL, CKMB, CKMBINDEX, TROPONINI No results found for: DDIMER Does the Patient currently have chest pain? No      R Recommendations: See Admitting Provider Note  Report given to:   Additional Notes:  Called lab to run troponin

## 2019-06-24 NOTE — ED Notes (Signed)
MD at bedside to pack left nostril

## 2019-06-24 NOTE — Progress Notes (Signed)
[facility] [place] NAME: Edwin Green    MR#:  [id number]  DATE OF BIRTH:  Jul 31, 1961  SUBJECTIVE:  CHIEF COMPLAINT:   Chief Complaint  Patient presents with  . Hypertension  . Epistaxis   No new complaint this morning.  No further nosebleed.  Nasal packing to the left nostril intact.  REVIEW OF SYSTEMS:  Review of Systems  Constitutional: Negative for chills and fever.  HENT: Positive for nosebleeds. Negative for hearing loss and tinnitus.   Eyes: Negative for blurred vision and double vision.  Respiratory: Negative for cough and shortness of breath.   Cardiovascular: Negative for chest pain and palpitations.  Gastrointestinal: Negative for heartburn and nausea.  Genitourinary: Negative for dysuria and urgency.  Musculoskeletal: Negative for myalgias and neck pain.  Skin: Negative for itching and rash.  Neurological: Negative for dizziness and headaches.  Psychiatric/Behavioral: Negative for depression and hallucinations.    DRUG ALLERGIES:  No Known Allergies VITALS:  Blood pressure (!) 142/98, pulse 82, temperature 98.5 F (36.9 C), temperature source Oral, resp. rate 15, height 6\' 1"  (1.854 m), weight 87.2 kg, SpO2 99 %. PHYSICAL EXAMINATION:   Physical Exam  Constitutional: He is oriented to person, place, and time. He appears well-developed and well-nourished.  HENT:  Head: Normocephalic.  Nasal packing to left nostril in place.  Eyes: Pupils are equal, round, and reactive to light. Conjunctivae are normal. Right eye exhibits no discharge.  Neck: Normal range of motion. Neck supple. No thyromegaly present.  Cardiovascular: Normal rate, regular rhythm and normal heart sounds.  Respiratory: Effort normal. No respiratory distress.  GI: Soft. Bowel sounds are normal. He exhibits no distension.  Musculoskeletal: Normal range of motion.        General: No edema.  Neurological: He is alert and oriented to person,  place, and time. No cranial nerve deficit.  Skin: Skin is warm. He is not diaphoretic. No erythema.  Psychiatric: He has a normal mood and affect. His behavior is normal.   LABORATORY PANEL:  Male CBC Recent Labs  Lab 06/24/19 0600  WBC 4.4  HGB 13.7  HCT 41.8  PLT 195   ------------------------------------------------------------------------------------------------------------------ Chemistries  Recent Labs  Lab 06/24/19 0600  NA 140  K 3.4*  CL 107  CO2 25  GLUCOSE 119*  BUN 18  CREATININE 1.13  CALCIUM 8.9   RADIOLOGY:  US Renal  Result Date: 06/24/2019 CLINICAL DATA:  Hypertensive urgency. EXAM: RENAL / URINARY TRACT ULTRASOUND COMPLETE COMPARISON:  None. FINDINGS: Right Kidney: Renal measurements: 10.5 x 4.9 x 5.2 cm = volume: 140 mL . Echogenicity within normal limits. No mass or hydronephrosis visualized. Left Kidney: Renal measurements: 10.3 x 5.5 x 5.4 cm = volume: 150 mL. Echogenicity within normal limits. No mass or hydronephrosis visualized. Bladder: Appears normal for degree of bladder distention. IMPRESSION: Negative examination. Electronically Signed   By: Inge Rise M.D.   On: 06/24/2019 11:59   Dg Chest Port 1 View  Result Date: 06/23/2019 CLINICAL DATA:  Hypertension. EXAM: PORTABLE CHEST 1 VIEW COMPARISON:  April 07, 2018 FINDINGS: The heart size and mediastinal contours are within normal limits. Both lungs are clear. The visualized skeletal structures are unremarkable. IMPRESSION: No active disease. Electronically Signed   By: Constance Holster M.D.   On: 06/23/2019 23:53   ASSESSMENT AND PLAN:   1.  Hypertensive urgency  Patient admitted to the ICU and started on nicardipine drip.   Patient not on any blood  pressure medications prior to admission.   To transition to p.o. blood pressure meds once blood pressure better controlled with nicardipine drip.  2.  Epistaxis No further nosebleed this morning.  Nasal packing in place to left nostrils.  Blood pressure control.  DVT prophylaxis; SCDs No heparin products due to nosebleed.   All the records are reviewed and case discussed with Care Management/Social Worker. Management plans discussed with the patient, family and they are in agreement.  CODE STATUS: Full Code  TOTAL TIME TAKING CARE OF THIS PATIENT: 33 minutes.   More than 50% of the time was spent in counseling/coordination of care: YES  POSSIBLE D/C IN 2 DAYS, DEPENDING ON CLINICAL CONDITION.   Esma Kilts M.D on 06/24/2019 at 3:37 PM  Between 7am to 6pm - Pager - [phone]  After 6pm go to [email or web address] - Social research officer, governmentpassword [email or web address]  [facility]  Office  [phone]  CC: Primary care physician; Patient, No Pcp Per  Note: This dictation was prepared with Dragon dictation along with smaller phrase technology. Any transcriptional errors that result from this process are unintentional.

## 2019-06-24 NOTE — H&P (Signed)
[facility] [place] [facility]   PATIENT NAME: Edwin BaileyDaniel Green    MR#:  [id number]  DATE OF BIRTH:  05/06/1961  DATE OF ADMISSION:  06/23/2019  PRIMARY CARE PHYSICIAN: Patient, No Pcp Per   REQUESTING/REFERRING PHYSICIAN: Dolores FrameSung, MD  CHIEF COMPLAINT:   Chief Complaint  Patient presents with  . Hypertension  . Epistaxis    HISTORY OF PRESENT ILLNESS:  Edwin Green  is a 58 y.o. male who presents with chief complaint as above.  Patient presents the ED with a complaint of nosebleed and elevated blood pressure.  He states that he has had high blood pressure for some time.  He used to be on medication for this, but has not taken any of that medication for a while, at least several months.  On arrival to the ED today his blood pressure was systolic 200s over diastolic 130s.  He had a difficult to control nosebleed which required packing.  He states his nosebleed started early in the morning, and recurred a couple times throughout the day, and in the evening he was unable to get it to stop.  He received multiple doses of antihypertensives in the ED without significant effect on his blood pressure.  Hospitalist were called for admission and further treatment  PAST MEDICAL HISTORY:   Past Medical History:  Diagnosis Date  . Hypertension      PAST SURGICAL HISTORY:   Past Surgical History:  Procedure Laterality Date  . NO PAST SURGERIES       SOCIAL HISTORY:   Social History   Tobacco Use  . Smoking status: Current Some Day Smoker    Types: Cigarettes  . Smokeless tobacco: Never Used  Substance Use Topics  . Alcohol use: Yes     FAMILY HISTORY:   Family History  Problem Relation Age of Onset  . Hypertension Mother   . Stroke Father   . Hypertension Father   . Asthma Son      DRUG ALLERGIES:  No Known Allergies  MEDICATIONS AT HOME:   Prior to Admission medications   Medication Sig Start Date End Date Taking? Authorizing Provider   hydrochlorothiazide (HYDRODIURIL) 50 MG tablet Take 1 tablet (50 mg total) by mouth daily. Patient not taking: Reported on 06/24/2019 03/19/18   Green, Edwin BailiffMagdalene S, NP  lisinopril (PRINIVIL,ZESTRIL) 20 MG tablet Take 1 tablet (20 mg total) by mouth daily. 03/19/18 04/18/18  Edwin Green, Edwin S, NP    REVIEW OF SYSTEMS:  Review of Systems  Constitutional: Negative for chills, fever, malaise/fatigue and weight loss.  HENT: Positive for nosebleeds. Negative for ear pain, hearing loss and tinnitus.   Eyes: Negative for blurred vision, double vision, pain and redness.  Respiratory: Negative for cough, hemoptysis and shortness of breath.   Cardiovascular: Negative for chest pain, palpitations, orthopnea and leg swelling.  Gastrointestinal: Negative for abdominal pain, constipation, diarrhea, nausea and vomiting.  Genitourinary: Negative for dysuria, frequency and hematuria.  Musculoskeletal: Negative for back pain, joint pain and neck pain.  Skin:       No acne, rash, or lesions  Neurological: Negative for dizziness, tremors, focal weakness and weakness.  Endo/Heme/Allergies: Negative for polydipsia. Does not bruise/bleed easily.  Psychiatric/Behavioral: Negative for depression. The patient is not nervous/anxious and does not have insomnia.      VITAL SIGNS:   Vitals:   06/23/19 2349 06/24/19 0000 06/24/19 0030 06/24/19 0101  BP: (!) 197/138 (!) 179/120 (!) 157/133 (!) 180/115  Pulse:  82 82 77  Resp:  20 16 14   Temp:      TempSrc:      SpO2:  98% 99% 98%  Weight:      Height:       Wt Readings from Last 3 Encounters:  06/23/19 95.3 kg  04/07/18 88.5 kg  03/19/18 89.1 kg    PHYSICAL EXAMINATION:  Physical Exam  Vitals reviewed. Constitutional: He is oriented to person, place, and time. He appears well-developed and well-nourished. No distress.  HENT:  Head: Normocephalic and atraumatic.  Mouth/Throat: Oropharynx is clear and moist.  Left nare with packing in place  Eyes:  Pupils are equal, round, and reactive to light. Conjunctivae and EOM are normal. No scleral icterus.  Neck: Normal range of motion. Neck supple. No JVD present. No thyromegaly present.  Cardiovascular: Normal rate, regular rhythm and intact distal pulses. Exam reveals no gallop and no friction rub.  No murmur heard. Respiratory: Effort normal and breath sounds normal. No respiratory distress. He has no wheezes. He has no rales.  GI: Soft. Bowel sounds are normal. He exhibits no distension. There is no abdominal tenderness.  Musculoskeletal: Normal range of motion.        General: No edema.     Comments: No arthritis, no gout  Lymphadenopathy:    He has no cervical adenopathy.  Neurological: He is alert and oriented to person, place, and time. No cranial nerve deficit.  No dysarthria, no aphasia  Skin: Skin is warm and dry. No rash noted. No erythema.  Psychiatric: He has a normal mood and affect. His behavior is normal. Judgment and thought content normal.    LABORATORY PANEL:   CBC Recent Labs  Lab 06/23/19 2337  WBC 4.7  HGB 12.4*  HCT 37.1*  PLT 196   ------------------------------------------------------------------------------------------------------------------  Chemistries  Recent Labs  Lab 06/23/19 2337  NA 140  K 3.7  CL 108  CO2 24  GLUCOSE 138*  BUN 24*  CREATININE 1.45*  CALCIUM 8.6*   ------------------------------------------------------------------------------------------------------------------  Cardiac Enzymes No results for input(s): TROPONINI in the last 168 hours. ------------------------------------------------------------------------------------------------------------------  RADIOLOGY:  Dg Chest Port 1 View  Result Date: 06/23/2019 CLINICAL DATA:  Hypertension. EXAM: PORTABLE CHEST 1 VIEW COMPARISON:  April 07, 2018 FINDINGS: The heart size and mediastinal contours are within normal limits. Both lungs are clear. The visualized skeletal  structures are unremarkable. IMPRESSION: No active disease. Electronically Signed   By: Katherine Mantlehristopher  Green M.D.   On: 06/23/2019 23:53    EKG:   Orders placed or performed during the hospital encounter of 06/23/19  . ED EKG  . ED EKG  . EKG 12-Lead  . EKG 12-Lead    IMPRESSION AND PLAN:  Principal Problem:   Accelerated hypertension -will use IV antihypertensives upfront to lower his blood pressure to less than 180/100 tonight, with his goal for the morning to be less than 160/100.  He will then need to be restarted on some form of oral antihypertensive Active Problems:   Nosebleed -left nare packed, bleeding has stopped currently.  Continue packing tonight.  Chart review performed and case discussed with ED provider. Labs, imaging and/or ECG reviewed by provider and discussed with patient/family. Management plans discussed with the patient and/or family.  COVID-19 status: Test pending  DVT PROPHYLAXIS: Mechanical only  GI PROPHYLAXIS:  None  ADMISSION STATUS: Observation  CODE STATUS: Full  TOTAL TIME TAKING CARE OF THIS PATIENT: 40 minutes.   This patient was evaluated in the context of the global COVID-19 pandemic,  which necessitated consideration that the patient might be at risk for infection with the SARS-CoV-2 virus that causes COVID-19. Institutional protocols and algorithms that pertain to the evaluation of patients at risk for COVID-19 are in a state of rapid change based on information released by regulatory bodies including the CDC and federal and state organizations. These policies and algorithms were followed to the best of this provider's knowledge to date during the patient's care at this facility.  Ethlyn Daniels 06/24/2019, 1:58 AM  [facility]  Office  [phone]  CC: Primary care physician; Patient, No Pcp Per  Note:  This document was prepared using Dragon voice recognition software and may include unintentional dictation errors.

## 2019-06-25 LAB — CBC WITH DIFFERENTIAL/PLATELET
Abs Immature Granulocytes: 0.02 10*3/uL (ref 0.00–0.07)
Basophils Absolute: 0 10*3/uL (ref 0.0–0.1)
Basophils Relative: 1 %
Eosinophils Absolute: 0 10*3/uL (ref 0.0–0.5)
Eosinophils Relative: 0 %
HCT: 42.4 % (ref 39.0–52.0)
Hemoglobin: 14.1 g/dL (ref 13.0–17.0)
Immature Granulocytes: 0 %
Lymphocytes Relative: 16 %
Lymphs Abs: 1.1 10*3/uL (ref 0.7–4.0)
MCH: 30.2 pg (ref 26.0–34.0)
MCHC: 33.3 g/dL (ref 30.0–36.0)
MCV: 90.8 fL (ref 80.0–100.0)
Monocytes Absolute: 0.4 10*3/uL (ref 0.1–1.0)
Monocytes Relative: 6 %
Neutro Abs: 5.3 10*3/uL (ref 1.7–7.7)
Neutrophils Relative %: 77 %
Platelets: 224 10*3/uL (ref 150–400)
RBC: 4.67 MIL/uL (ref 4.22–5.81)
RDW: 13.9 % (ref 11.5–15.5)
WBC: 7 10*3/uL (ref 4.0–10.5)
nRBC: 0 % (ref 0.0–0.2)

## 2019-06-25 LAB — BASIC METABOLIC PANEL
Anion gap: 7 (ref 5–15)
BUN: 27 mg/dL — ABNORMAL HIGH (ref 6–20)
CO2: 23 mmol/L (ref 22–32)
Calcium: 8.6 mg/dL — ABNORMAL LOW (ref 8.9–10.3)
Chloride: 106 mmol/L (ref 98–111)
Creatinine, Ser: 1.37 mg/dL — ABNORMAL HIGH (ref 0.61–1.24)
GFR calc Af Amer: >60 mL/min (ref >60)
GFR calc non Af Amer: 58 mL/min — ABNORMAL LOW (ref >60)
Glucose, Bld: 130 mg/dL — ABNORMAL HIGH (ref 70–99)
Potassium: 3.6 mmol/L (ref 3.5–5.1)
Sodium: 136 mmol/L (ref 135–145)

## 2019-06-25 LAB — HIV ANTIBODY (ROUTINE TESTING W REFLEX): HIV Screen 4th Generation wRfx: NONREACTIVE

## 2019-06-25 LAB — MAGNESIUM: Magnesium: 2.2 mg/dL (ref 1.7–2.4)

## 2019-06-25 NOTE — Progress Notes (Signed)
Report called to [place] on 2A. Patient transferred via W/C to 2A at [phone]

## 2019-06-25 NOTE — Progress Notes (Signed)
[facility] at [place] NAME: Edwin Green    MR#:  [id number]  DATE OF BIRTH:  1961-11-18  SUBJECTIVE:  CHIEF COMPLAINT:   Chief Complaint  Patient presents with  . Hypertension  . Epistaxis   No new complaint this morning.  No further nosebleed.  Nasal packing taken out this morning.  Patient transferred out of ICU this morning.  REVIEW OF SYSTEMS:  Review of Systems  Constitutional: Negative for chills and fever.  HENT: Positive for nosebleeds. Negative for hearing loss and tinnitus.   Eyes: Negative for blurred vision and double vision.  Respiratory: Negative for cough and shortness of breath.   Cardiovascular: Negative for chest pain and palpitations.  Gastrointestinal: Negative for heartburn and nausea.  Genitourinary: Negative for dysuria and urgency.  Musculoskeletal: Negative for myalgias and neck pain.  Skin: Negative for itching and rash.  Neurological: Negative for dizziness and headaches.  Psychiatric/Behavioral: Negative for depression and hallucinations.    DRUG ALLERGIES:  No Known Allergies VITALS:  Blood pressure (!) 155/92, pulse 73, temperature 97.8 F (36.6 C), temperature source Oral, resp. rate 18, height 6\' 1"  (1.854 m), weight 88.9 kg, SpO2 100 %. PHYSICAL EXAMINATION:   Physical Exam  Constitutional: He is oriented to person, place, and time. He appears well-developed and well-nourished.  HENT:  Head: Normocephalic.  Eyes: Pupils are equal, round, and reactive to light. Conjunctivae are normal. Right eye exhibits no discharge.  Neck: Normal range of motion. Neck supple. No thyromegaly present.  Cardiovascular: Normal rate, regular rhythm and normal heart sounds.  Respiratory: Effort normal. No respiratory distress.  GI: Soft. Bowel sounds are normal. He exhibits no distension.  Musculoskeletal: Normal range of motion.        General: No edema.  Neurological: He is alert and oriented to person,  place, and time. No cranial nerve deficit.  Skin: Skin is warm. He is not diaphoretic. No erythema.  Psychiatric: He has a normal mood and affect. His behavior is normal.   LABORATORY PANEL:  Male CBC Recent Labs  Lab 06/25/19 0312  WBC 7.0  HGB 14.1  HCT 42.4  PLT 224   ------------------------------------------------------------------------------------------------------------------ Chemistries  Recent Labs  Lab 06/25/19 0312  NA 136  K 3.6  CL 106  CO2 23  GLUCOSE 130*  BUN 27*  CREATININE 1.37*  CALCIUM 8.6*  MG 2.2   RADIOLOGY:  No results found. ASSESSMENT AND PLAN:   1.  Hypertensive urgency  Patient admitted to the ICU and started on nicardipine drip.   Patient not on any blood pressure medications prior to admission.   Patient weaned off nicardipine drip and started on lisinopril and his CTZ this morning.  Transferred out of ICU this morning.  Monitor blood pressure to ensure no rebound and blood pressure remains controlled on current regimen with plans for possible discharge tomorrow.  2.  Epistaxis No further nosebleed this morning.  Nasal packing in place to left nostrils removed prior to transfer out of ICU this morning. Blood pressure control.  DVT prophylaxis; SCDs No heparin products due to nosebleed.   All the records are reviewed and case discussed with Care Management/Social Worker. Management plans discussed with the patient, family and they are in agreement.  CODE STATUS: Full Code  TOTAL TIME TAKING CARE OF THIS PATIENT: 34 minutes.   More than 50% of the time was spent in counseling/coordination of care: YES  POSSIBLE D/C IN 2 DAYS, DEPENDING ON CLINICAL CONDITION.  Annelle Behrendt M.D on 06/25/2019 at 2:37 PM  Between 7am to 6pm - Pager - [phone]  After 6pm go to [email or web address] - Social research officer, governmentpassword EPAS [facility]  [facility]  Office  [phone]  CC: Primary care physician; Patient, No Pcp Per  Note: This  dictation was prepared with Dragon dictation along with smaller phrase technology. Any transcriptional errors that result from this process are unintentional.

## 2019-06-25 NOTE — Progress Notes (Signed)
B/P elevated. Diastolic over 735. Rechecked in opposite arm and same arm again. Given prn hydralazine per orders.

## 2019-06-25 NOTE — Progress Notes (Signed)
Educated on side effects of uncontrolled high blood pressure including renal and affect on his sexual health.

## 2019-06-26 ENCOUNTER — Other Ambulatory Visit: Payer: Self-pay

## 2019-06-26 ENCOUNTER — Emergency Department: Payer: No Typology Code available for payment source

## 2019-06-26 ENCOUNTER — Inpatient Hospital Stay (HOSPITAL_COMMUNITY)
Admission: EM | Admit: 2019-06-26 | Discharge: 2019-06-29 | Disposition: A | Discharge status: Home / Self Care | Payer: No Typology Code available for payment source | Source: Home / Self Care | Attending: Internal Medicine | Admitting: Internal Medicine

## 2019-06-26 DIAGNOSIS — I248 Other forms of acute ischemic heart disease: Secondary | ICD-10-CM | POA: Diagnosis present

## 2019-06-26 DIAGNOSIS — Z79899 Other long term (current) drug therapy: Secondary | ICD-10-CM

## 2019-06-26 DIAGNOSIS — R55 Syncope and collapse: Secondary | ICD-10-CM | POA: Diagnosis present

## 2019-06-26 DIAGNOSIS — F17210 Nicotine dependence, cigarettes, uncomplicated: Secondary | ICD-10-CM | POA: Diagnosis present

## 2019-06-26 DIAGNOSIS — I422 Other hypertrophic cardiomyopathy: Secondary | ICD-10-CM

## 2019-06-26 DIAGNOSIS — N179 Acute kidney failure, unspecified: Secondary | ICD-10-CM | POA: Diagnosis present

## 2019-06-26 DIAGNOSIS — E860 Dehydration: Secondary | ICD-10-CM | POA: Diagnosis present

## 2019-06-26 DIAGNOSIS — I10 Essential (primary) hypertension: Secondary | ICD-10-CM | POA: Diagnosis present

## 2019-06-26 DIAGNOSIS — Z8249 Family history of ischemic heart disease and other diseases of the circulatory system: Secondary | ICD-10-CM

## 2019-06-26 DIAGNOSIS — R079 Chest pain, unspecified: Secondary | ICD-10-CM | POA: Diagnosis present

## 2019-06-26 DIAGNOSIS — I421 Obstructive hypertrophic cardiomyopathy: Secondary | ICD-10-CM | POA: Diagnosis present

## 2019-06-26 LAB — COMPREHENSIVE METABOLIC PANEL
ALT: 24 U/L (ref 0–44)
AST: 23 U/L (ref 15–41)
Albumin: 4.3 g/dL (ref 3.5–5.0)
Alkaline Phosphatase: 60 U/L (ref 38–126)
Anion gap: 12 (ref 5–15)
BUN: 37 mg/dL — ABNORMAL HIGH (ref 6–20)
CO2: 24 mmol/L (ref 22–32)
Calcium: 9.1 mg/dL (ref 8.9–10.3)
Chloride: 102 mmol/L (ref 98–111)
Creatinine, Ser: 2.67 mg/dL — ABNORMAL HIGH (ref 0.61–1.24)
GFR calc Af Amer: 30 mL/min — ABNORMAL LOW (ref >60)
GFR calc non Af Amer: 26 mL/min — ABNORMAL LOW (ref >60)
Glucose, Bld: 146 mg/dL — ABNORMAL HIGH (ref 70–99)
Potassium: 4 mmol/L (ref 3.5–5.1)
Sodium: 138 mmol/L (ref 135–145)
Total Bilirubin: 0.6 mg/dL (ref 0.3–1.2)
Total Protein: 7.6 g/dL (ref 6.5–8.1)

## 2019-06-26 LAB — CBC
HCT: 45 % (ref 39.0–52.0)
Hemoglobin: 14.7 g/dL (ref 13.0–17.0)
MCH: 29.5 pg (ref 26.0–34.0)
MCHC: 32.7 g/dL (ref 30.0–36.0)
MCV: 90.2 fL (ref 80.0–100.0)
Platelets: 246 10*3/uL (ref 150–400)
RBC: 4.99 MIL/uL (ref 4.22–5.81)
RDW: 14.1 % (ref 11.5–15.5)
WBC: 6.6 10*3/uL (ref 4.0–10.5)
nRBC: 0 % (ref 0.0–0.2)

## 2019-06-26 LAB — BASIC METABOLIC PANEL
Anion gap: 10 (ref 5–15)
BUN: 28 mg/dL — ABNORMAL HIGH (ref 6–20)
CO2: 23 mmol/L (ref 22–32)
Calcium: 8.9 mg/dL (ref 8.9–10.3)
Chloride: 103 mmol/L (ref 98–111)
Creatinine, Ser: 1.55 mg/dL — ABNORMAL HIGH (ref 0.61–1.24)
GFR calc Af Amer: 58 mL/min — ABNORMAL LOW (ref >60)
GFR calc non Af Amer: 50 mL/min — ABNORMAL LOW (ref >60)
Glucose, Bld: 110 mg/dL — ABNORMAL HIGH (ref 70–99)
Potassium: 3.9 mmol/L (ref 3.5–5.1)
Sodium: 136 mmol/L (ref 135–145)

## 2019-06-26 LAB — MAGNESIUM: Magnesium: 2.5 mg/dL — ABNORMAL HIGH (ref 1.7–2.4)

## 2019-06-26 LAB — TROPONIN I (HIGH SENSITIVITY)
Troponin I (High Sensitivity): 52 ng/L — ABNORMAL HIGH (ref <18)
Troponin I (High Sensitivity): 61 ng/L — ABNORMAL HIGH (ref <18)

## 2019-06-26 MED ORDER — AMLODIPINE BESYLATE 5 MG PO TABS
5.0000 mg | ORAL_TABLET | Freq: Every day | ORAL | Status: DC
  Administered 2019-06-26: 5 mg via ORAL
  Filled 2019-06-26: qty 1

## 2019-06-26 MED ORDER — OXYCODONE HCL 5 MG PO TABS: 5.0000 mg | ORAL_TABLET | ORAL | Status: DC | PRN

## 2019-06-26 MED ORDER — LISINOPRIL 20 MG PO TABS: 20.0000 mg | ORAL_TABLET | Freq: Every day | ORAL | 0 refills | Status: DC

## 2019-06-26 MED ORDER — ONDANSETRON HCL 4 MG PO TABS: 4.0000 mg | ORAL_TABLET | Freq: Four times a day (QID) | ORAL | Status: DC | PRN

## 2019-06-26 MED ORDER — SODIUM CHLORIDE 0.9 % IV SOLN
INTRAVENOUS | Status: AC
  Administered 2019-06-26: 23:00:00 via INTRAVENOUS

## 2019-06-26 MED ORDER — ONDANSETRON HCL 4 MG/2ML IJ SOLN: 4.0000 mg | Freq: Four times a day (QID) | INTRAMUSCULAR | Status: DC | PRN

## 2019-06-26 MED ORDER — HYDROCHLOROTHIAZIDE 50 MG PO TABS: 50.0000 mg | ORAL_TABLET | Freq: Every day | ORAL | 0 refills | Status: DC

## 2019-06-26 MED ORDER — ACETAMINOPHEN 650 MG RE SUPP: 650.0000 mg | Freq: Four times a day (QID) | RECTAL | Status: DC | PRN

## 2019-06-26 MED ORDER — ACETAMINOPHEN 325 MG PO TABS
650.0000 mg | ORAL_TABLET | Freq: Four times a day (QID) | ORAL | Status: DC | PRN
  Administered 2019-06-28: 16:00:00 650 mg via ORAL
  Filled 2019-06-26: qty 2

## 2019-06-26 MED ORDER — METOPROLOL TARTRATE 25 MG PO TABS
12.5000 mg | ORAL_TABLET | Freq: Once | ORAL | Status: AC
  Administered 2019-06-26: 12.5 mg via ORAL
  Filled 2019-06-26: qty 1

## 2019-06-26 MED ORDER — ENOXAPARIN SODIUM 40 MG/0.4ML ~~LOC~~ SOLN
40.0000 mg | SUBCUTANEOUS | Status: DC
  Administered 2019-06-26 – 2019-06-28 (×3): 40 mg via SUBCUTANEOUS
  Filled 2019-06-26 (×3): qty 0.4

## 2019-06-26 MED ORDER — AMLODIPINE BESYLATE 5 MG PO TABS: 5.0000 mg | ORAL_TABLET | Freq: Every day | ORAL | 0 refills | Status: DC

## 2019-06-26 NOTE — Plan of Care (Signed)

## 2019-06-26 NOTE — ED Notes (Signed)
Pt began c/o increasing SOB and some chest pressure. MD notified. See new orders. Pt reports that he is anxious and trying to calm himself down. This RN explained circumstances and pt willing to stay if needed.

## 2019-06-26 NOTE — Discharge Instructions (Signed)
Heart healthy diet

## 2019-06-26 NOTE — ED Notes (Signed)
ED TO INPATIENT HANDOFF REPORT  ED Nurse Name and Phone #: Janett Billow [phone]  S Name/Age/Gender Edwin Green 58 y.o. male Room/Bed: ED12A/ED12A  Code Status   Code Status: Prior  Home/SNF/Other Home Patient oriented to: self, place, time and situation Is this baseline? Yes   Triage Complete: Triage complete  Chief Complaint Syncope  Triage Note Pt comes via EMS after a near syncopal episode. Pt was released from hospital today for HTN and was about to fill medications and pt had a near syncopal episode witnessed. Pt thinks they were experimenting with BP meds too much while in hospital today and it all hit him when he got home. Pt was positive orthstatic with EMS. 109/68 laying, 96/66 sitting, 87/58 standing.Pt has no complaints at this time.    Allergies No Known Allergies  Level of Care/Admitting Diagnosis ED Disposition    ED Disposition Condition [place] [facility]: [place] [[id number]]  Level of Care: Telemetry [5]  Covid Evaluation: Confirmed COVID Negative  Diagnosis: Syncope [259563]  Admitting Physician: Lance Coon [[id number]]  Attending Physician: Lance Coon [phone]  Estimated length of stay: past midnight tomorrow  Certification:: I certify this patient will need inpatient services for at least 2 midnights  Bed request comments: 2a  PT Class (Do Not Modify): Inpatient [101]  PT Acc Code (Do Not Modify): Private [1]       B Medical/Surgery History Past Medical History:  Diagnosis Date  . Hypertension    Past Surgical History:  Procedure Laterality Date  . NO PAST SURGERIES       A IV Location/Drains/Wounds Patient Lines/Drains/Airways Status   Active Line/Drains/Airways    None          Intake/Output Last 24 hours No intake or output data in the 24 hours ending 06/26/19 2239  Labs/Imaging Results for orders placed or performed during the hospital encounter of 06/26/19 (from the past 48 hour(s))   CBC     Status: None   Collection Time: 06/26/19  7:18 PM  Result Value Ref Range   WBC 6.6 4.0 - 10.5 K/uL   RBC 4.99 4.22 - 5.81 MIL/uL   Hemoglobin 14.7 13.0 - 17.0 g/dL   HCT 45.0 39.0 - 52.0 %   MCV 90.2 80.0 - 100.0 fL   MCH 29.5 26.0 - 34.0 pg   MCHC 32.7 30.0 - 36.0 g/dL   RDW 14.1 11.5 - 15.5 %   Platelets 246 150 - 400 K/uL   nRBC 0.0 0.0 - 0.2 %    Comment: Performed at [facility], [address] [place],  [address]  Comprehensive metabolic panel     Status: Abnormal   Collection Time: 06/26/19  7:18 PM  Result Value Ref Range   Sodium 138 135 - 145 mmol/L   Potassium 4.0 3.5 - 5.1 mmol/L   Chloride 102 98 - 111 mmol/L   CO2 24 22 - 32 mmol/L   Glucose, Bld 146 (H) 70 - 99 mg/dL   BUN 37 (H) 6 - 20 mg/dL   Creatinine, Ser 2.67 (H) 0.61 - 1.24 mg/dL   Calcium 9.1 8.9 - 10.3 mg/dL   Total Protein 7.6 6.5 - 8.1 g/dL   Albumin 4.3 3.5 - 5.0 g/dL   AST 23 15 - 41 U/L   ALT 24 0 - 44 U/L   Alkaline Phosphatase 60 38 - 126 U/L   Total Bilirubin 0.6 0.3 - 1.2 mg/dL   GFR calc non  Af Amer 26 (L) >60 mL/min   GFR calc Af Amer 30 (L) >60 mL/min   Anion gap 12 5 - 15    Comment: Performed at [facility], [address] [place], [place] [address]  Troponin I (High Sensitivity)     Status: Abnormal   Collection Time: 06/26/19  7:18 PM  Result Value Ref Range   Troponin I (High Sensitivity) 52 (H) <18 ng/L    Comment: (NOTE) Elevated high sensitivity troponin I (hsTnI) values and significant  changes across serial measurements may suggest ACS but many other  chronic and acute conditions are known to elevate hsTnI results.  Refer to the "Links" section for chest pain algorithms and additional  guidance. Performed at [facility], [address] [place], [place] [address]   Troponin I (High Sensitivity)     Status: Abnormal   Collection Time: 06/26/19 10:03 PM  Result Value Ref Range   Troponin I (High Sensitivity) 61 (H) <18  ng/L    Comment: (NOTE) Elevated high sensitivity troponin I (hsTnI) values and significant  changes across serial measurements may suggest ACS but many other  chronic and acute conditions are known to elevate hsTnI results.  Refer to the "Links" section for chest pain algorithms and additional  guidance. Performed at [facility], [address] [place], [place] [address]    No results found.  Pending Labs [employer] Labs (From admission, onward)    Start     Ordered   Signed and Held  CBC  (enoxaparin (LOVENOX)    CrCl >/= 30 ml/min)  Once,   R    Comments: Baseline for enoxaparin therapy IF NOT ALREADY DRAWN.  Notify MD if PLT < 100 K.    Signed and Held   Signed and Held  Creatinine, serum  (enoxaparin (LOVENOX)    CrCl >/= 30 ml/min)  Once,   R    Comments: Baseline for enoxaparin therapy IF NOT ALREADY DRAWN.    Signed and Held   Signed and Held  Creatinine, serum  (enoxaparin (LOVENOX)    CrCl >/= 30 ml/min)  Weekly,   R    Comments: while on enoxaparin therapy    Signed and Held   Signed and Held  Basic metabolic panel  Tomorrow morning,   R     Signed and Held   Signed and Held  CBC  Tomorrow morning,   R     Signed and Held          Vitals/Pain Today's Vitals   06/26/19 2030 06/26/19 2100 06/26/19 2130 06/26/19 2200  BP: 114/89 126/88 120/89 135/89  Pulse: 96 (!) 101 91 83  Resp: (!) 6 17 15  (!) 28  Temp:      TempSrc:      SpO2: 97% 96% 96% 100%  Weight:      Height:      PainSc:        Isolation Precautions No active isolations  Medications Medications  metoprolol tartrate (LOPRESSOR) tablet 12.5 mg (has no administration in time range)    Mobility walks Moderate fall risk   Focused Assessments Cardiac Assessment Handoff:  Cardiac Rhythm: Normal sinus rhythm No results found for: CKTOTAL, CKMB, CKMBINDEX, TROPONINI No results found for: DDIMER Does the Patient currently have chest pain? No      R Recommendations: See  Admitting Provider Note  Report given to:   Additional Notes:

## 2019-06-26 NOTE — ED Provider Notes (Signed)
[facility] Emergency Department Provider Note  Time seen: 7:46 PM  I have reviewed the triage vital signs and the nursing notes.   HISTORY  Chief Complaint Near Syncope   HPI Edwin Green is a 58 y.o. male with a past medical history of hypertension presents to the emergency department for a syncopal episode.  According to the patient and record review patient was recently admitted to the hospital discharge earlier today after an admission for hypertensive emergency and epistaxis.  Patient states he had been off all of his blood pressure medications for at least 1.5 years.  Upon arrival patient states his blood pressure was 220 systolic 2 days ago.  Patient states he was at the pharmacy getting his prescriptions filled when he began feeling lightheaded, sat down and checked his blood pressure which at that time was around 120 systolic.  Patient states he then got up and felt very lightheaded got sweaty and sat down on the ground had a brief syncopal event.  Patient states he has passed out previously as well.  Patient denies any chest pain now or at any point.  Overall the patient appears well, no acute distress.  Denies any recent cough congestion fever.   Past Medical History:  Diagnosis Date  . Hypertension     Patient Active Problem List   Diagnosis Date Noted  . Accelerated hypertension 06/24/2019  . Nosebleed 06/24/2019  . Hypertensive urgency 06/24/2019    Past Surgical History:  Procedure Laterality Date  . NO PAST SURGERIES      Prior to Admission medications   Medication Sig Start Date End Date Taking? Authorizing Provider  amLODipine (NORVASC) 5 MG tablet Take 1 tablet (5 mg total) by mouth daily. 06/26/19   Milagros LollSudini, Srikar, MD  hydrochlorothiazide (HYDRODIURIL) 50 MG tablet Take 1 tablet (50 mg total) by mouth daily. 06/26/19   Milagros LollSudini, Srikar, MD  lisinopril (ZESTRIL) 20 MG tablet Take 1 tablet (20 mg total) by mouth daily. 06/26/19 07/26/19   Milagros LollSudini, Srikar, MD    No Known Allergies  Family History  Problem Relation Age of Onset  . Hypertension Mother   . Stroke Father   . Hypertension Father   . Asthma Son     Social History Social History   Tobacco Use  . Smoking status: Current Some Day Smoker    Types: Cigarettes  . Smokeless tobacco: Never Used  Substance Use Topics  . Alcohol use: Yes  . Drug use: No    Review of Systems Constitutional: Negative for fever. ENT: Negative for recent illness/congestion Cardiovascular: Negative for chest pain. Respiratory: Negative for shortness of breath. Gastrointestinal: Negative for abdominal pain Musculoskeletal: Negative for musculoskeletal complaints Skin: Negative for skin complaints  Neurological: Negative for headache All other ROS negative  ____________________________________________   PHYSICAL EXAM:  VITAL SIGNS: ED Triage Vitals  Enc Vitals Group     BP 06/26/19 1903 (!) 125/99     Pulse Rate 06/26/19 1903 86     Resp 06/26/19 1903 16     Temp 06/26/19 1903 98.4 F (36.9 C)     Temp Source 06/26/19 1903 Oral     SpO2 06/26/19 1903 100 %     Weight 06/26/19 1902 180 lb (81.6 kg)     Height 06/26/19 1902 5\' 8"  (1.727 m)     Head Circumference --      Peak Flow --      Pain Score 06/26/19 1902 0     Pain Loc --  Pain Edu? --      Excl. in [place]? --     Constitutional: Alert and oriented. Well appearing and in no distress. Eyes: Normal exam ENT      Head: Normocephalic and atraumatic.      Mouth/Throat: Mucous membranes are moist. Cardiovascular: Normal rate, regular rhythm. No murmur Respiratory: Normal respiratory effort without tachypnea nor retractions. Breath sounds are clear  Gastrointestinal: Soft and nontender. No distention.   Musculoskeletal: Nontender with normal range of motion in all extremities.  Neurologic:  Normal speech and language. No gross focal neurologic deficits  Skin:  Skin is warm, dry and intact.  Psychiatric:  Mood and affect are normal.   ____________________________________________    EKG  EKG viewed and interpreted by myself shows a normal sinus rhythm at 93 bpm with a narrow QRS, normal axis, normal intervals.  Patient does have T wave inversions in the lateral leads.  Which were present in his prior EKG 06/23/2019.   INITIAL IMPRESSION / ASSESSMENT AND PLAN / ED COURSE  Pertinent labs & imaging results that were available during my care of the patient were reviewed by me and considered in my medical decision making (see chart for details).   Patient presents to the emergency department after syncopal event today.  Patient was admitted for hypertensive emergency discharged from the hospital today.  Was at the pharmacy getting his medications when he began feeling lightheaded, became diaphoretic and had a syncopal event.  EMS states upon their arrival patient's blood pressure was in the 80s.  Currently 125/99.  Patient states he feels much better.  Differential would include metabolic abnormality, renal insufficiency, ACS, medication reaction.  Given the patient's blood pressure had been in the 200s for an unknown amount of time prior to his recent admission patient was restarted on hydrochlorothiazide, lisinopril and amlodipine.  Could be medication induced.  We will check labs.  EKG does show T wave inversions in the lateral leads however this is unchanged from 06/23/2019.  Cardiac enzymes have been added onto the patient's blood work.  As long as the patient continues to appear well I would anticipate likely discharge home.  I discussed with the patient maintaining his dose of hydrochlorothiazide but decreasing his lisinopril to 10 mg daily and amlodipine to 2.5 mg daily for the next 2 weeks.  Patient's labs have resulted showing a significant increase in his creatinine from discharge as well as an increase in troponin which could very likely be due to the increase in creatinine.  However given the  patient's syncopal event tonight with increased troponin and increased creatinine we will admit to the hospital service for further treatment and work-up.  Edwin Green was evaluated in Emergency Department on 06/26/2019 for the symptoms described in the history of present illness. He was evaluated in the context of the global COVID-19 pandemic, which necessitated consideration that the patient might be at risk for infection with the SARS-CoV-2 virus that causes COVID-19. Institutional protocols and algorithms that pertain to the evaluation of patients at risk for COVID-19 are in a state of rapid change based on information released by regulatory bodies including the CDC and federal and state organizations. These policies and algorithms were followed during the patient's care in the ED.  ____________________________________________   FINAL CLINICAL IMPRESSION(S) / ED DIAGNOSES  Syncope Renal insufficiency   Harvest Dark, MD 06/26/19 2154

## 2019-06-26 NOTE — Progress Notes (Addendum)
Patient declined call to family member for update. Stating he will talk to them after discharge.   1300: Discharge instructions gone over with patient. Prescriptions given to patient as well as taxi voucher. Taxi called. No questions or concerns by patient. IV taken out and tele monitor off.

## 2019-06-26 NOTE — H&P (Signed)
[facility] [place] [facility]   PATIENT NAME: Edwin BaileyDaniel Stracener    MR#:  [id number]  DATE OF BIRTH:  07/22/1961  DATE OF ADMISSION:  06/26/2019  PRIMARY CARE PHYSICIAN: Patient, No Pcp Per   REQUESTING/REFERRING PHYSICIAN: Paduchowski, MD  CHIEF COMPLAINT:   Chief Complaint  Patient presents with  . Near Syncope    HISTORY OF PRESENT ILLNESS:  Edwin Green  is a 58 y.o. male who presents with chief complaint as above.  Patient brought to the ED after syncopal event.  He was recently admitted here for uncontrolled hypertension and nosebleed.  He was discharged and was at the pharmacy getting his medications when he began to feel nauseated, diaphoretic.  He then had a syncopal event.  Here in the ED he has some chest discomfort.  He was noted to have some more pronounced T wave inversions on his EKG, and an elevated troponin.  During his last admission his troponin values were negative.  Hospitalist were called for admission  PAST MEDICAL HISTORY:   Past Medical History:  Diagnosis Date  . Hypertension      PAST SURGICAL HISTORY:   Past Surgical History:  Procedure Laterality Date  . NO PAST SURGERIES       SOCIAL HISTORY:   Social History   Tobacco Use  . Smoking status: Current Some Day Smoker    Types: Cigarettes  . Smokeless tobacco: Never Used  Substance Use Topics  . Alcohol use: Yes     FAMILY HISTORY:   Family History  Problem Relation Age of Onset  . Hypertension Mother   . Stroke Father   . Hypertension Father   . Asthma Son      DRUG ALLERGIES:  No Known Allergies  MEDICATIONS AT HOME:   Prior to Admission medications   Medication Sig Start Date End Date Taking? Authorizing Provider  amLODipine (NORVASC) 5 MG tablet Take 1 tablet (5 mg total) by mouth daily. 06/26/19  Yes Sudini, Wardell HeathSrikar, MD  hydrochlorothiazide (HYDRODIURIL) 50 MG tablet Take 1 tablet (50 mg total) by mouth daily. 06/26/19  Yes Sudini,  Wardell HeathSrikar, MD  lisinopril (ZESTRIL) 20 MG tablet Take 1 tablet (20 mg total) by mouth daily. 06/26/19 07/26/19 Yes Milagros LollSudini, Srikar, MD    REVIEW OF SYSTEMS:  Review of Systems  Constitutional: Negative for chills, fever, malaise/fatigue and weight loss.  HENT: Negative for ear pain, hearing loss and tinnitus.   Eyes: Negative for blurred vision, double vision, pain and redness.  Respiratory: Positive for shortness of breath. Negative for cough and hemoptysis.   Cardiovascular: Positive for chest pain. Negative for palpitations, orthopnea and leg swelling.  Gastrointestinal: Negative for abdominal pain, constipation, diarrhea, nausea and vomiting.  Genitourinary: Negative for dysuria, frequency and hematuria.  Musculoskeletal: Negative for back pain, joint pain and neck pain.  Skin:       No acne, rash, or lesions  Neurological: Positive for loss of consciousness. Negative for dizziness, tremors, focal weakness and weakness.  Endo/Heme/Allergies: Negative for polydipsia. Does not bruise/bleed easily.  Psychiatric/Behavioral: Negative for depression. The patient is not nervous/anxious and does not have insomnia.      VITAL SIGNS:   Vitals:   06/26/19 2030 06/26/19 2100 06/26/19 2130 06/26/19 2200  BP: 114/89 126/88 120/89 135/89  Pulse: 96 (!) 101 91 83  Resp: (!) 6 17 15  (!) 28  Temp:      TempSrc:      SpO2: 97% 96% 96% 100%  Weight:  Height:       Wt Readings from Last 3 Encounters:  06/26/19 81.6 kg  06/25/19 88.9 kg  04/07/18 88.5 kg    PHYSICAL EXAMINATION:  Physical Exam  Vitals reviewed. Constitutional: He is oriented to person, place, and time. He appears well-developed and well-nourished. No distress.  HENT:  Head: Normocephalic and atraumatic.  Mouth/Throat: Oropharynx is clear and moist.  Eyes: Pupils are equal, round, and reactive to light. Conjunctivae and EOM are normal. No scleral icterus.  Neck: Normal range of motion. Neck supple. No JVD present. No  thyromegaly present.  Cardiovascular: Regular rhythm and intact distal pulses. Exam reveals no gallop and no friction rub.  No murmur heard. Borderline tachycardic  Respiratory: Effort normal and breath sounds normal. No respiratory distress. He has no wheezes. He has no rales.  GI: Soft. Bowel sounds are normal. He exhibits no distension. There is no abdominal tenderness.  Musculoskeletal: Normal range of motion.        General: No edema.     Comments: No arthritis, no gout  Lymphadenopathy:    He has no cervical adenopathy.  Neurological: He is alert and oriented to person, place, and time. No cranial nerve deficit.  No dysarthria, no aphasia  Skin: Skin is warm and dry. No rash noted. No erythema.  Psychiatric: He has a normal mood and affect. His behavior is normal. Judgment and thought content normal.    LABORATORY PANEL:   CBC Recent Labs  Lab 06/26/19 1918  WBC 6.6  HGB 14.7  HCT 45.0  PLT 246   ------------------------------------------------------------------------------------------------------------------  Chemistries  Recent Labs  Lab 06/26/19 0607 06/26/19 1918  NA 136 138  K 3.9 4.0  CL 103 102  CO2 23 24  GLUCOSE 110* 146*  BUN 28* 37*  CREATININE 1.55* 2.67*  CALCIUM 8.9 9.1  MG 2.5*  --   AST  --  23  ALT  --  24  ALKPHOS  --  60  BILITOT  --  0.6   ------------------------------------------------------------------------------------------------------------------  Cardiac Enzymes No results for input(s): TROPONINI in the last 168 hours. ------------------------------------------------------------------------------------------------------------------  RADIOLOGY:  No results found.  EKG:   Orders placed or performed during the hospital encounter of 06/26/19  . EKG 12-Lead  . EKG 12-Lead  . EKG 12-Lead  . EKG 12-Lead  . EKG 12-Lead  . EKG 12-Lead    IMPRESSION AND PLAN:  Principal Problem:   Syncope -unclear etiology, however given  his story and work-up in the ED there is some concern for ACS event.  See below for work-up Active Problems:   Chest pain -in conjunction with syncope above.  Troponins elevated here.  We will cycle his cardiac enzymes tonight, get an echocardiogram and a cardiology consult.  Beta-blocker to reduce his heart rate some.   AKI (acute kidney injury) ([place]) -gentle IV fluids tonight, avoid nephrotoxins and monitor for improvement   HTN (hypertension) -blood pressure is normotensive here, we will hold antihypertensives for now unless his blood pressure rises  Chart review performed and case discussed with ED provider. Labs, imaging and/or ECG reviewed by provider and discussed with patient/family. Management plans discussed with the patient and/or family.  COVID-19 status: Tested negative     DVT PROPHYLAXIS: SubQ lovenox   GI PROPHYLAXIS:  None  ADMISSION STATUS: Inpatient     CODE STATUS: Full Code Status History    Date Active Date Inactive Code Status Order ID Comments User Context   06/24/2019 0414 06/26/2019 1851 Full Code  [id number]  Oralia ManisWillis, Athalia Setterlund, MD Inpatient   Advance Care Planning Activity      TOTAL TIME TAKING CARE OF THIS PATIENT: 45 minutes.   This patient was evaluated in the context of the global COVID-19 pandemic, which necessitated consideration that the patient might be at risk for infection with the SARS-CoV-2 virus that causes COVID-19. Institutional protocols and algorithms that pertain to the evaluation of patients at risk for COVID-19 are in a state of rapid change based on information released by regulatory bodies including the CDC and federal and state organizations. These policies and algorithms were followed to the best of this provider's knowledge to date during the patient's care at this facility.  Barney DrainDavid F Caellum Mancil 06/26/2019, 10:24 PM  [facility]  Office  [phone]  CC: Primary care physician; Patient, No Pcp Per  Note:  This document was  prepared using Dragon voice recognition software and may include unintentional dictation errors.

## 2019-06-26 NOTE — ED Triage Notes (Signed)
Pt comes via EMS after a near syncopal episode. Pt was released from hospital today for HTN and was about to fill medications and pt had a near syncopal episode witnessed. Pt thinks they were experimenting with BP meds too much while in hospital today and it all hit him when he got home. Pt was positive orthstatic with EMS. 109/68 laying, 96/66 sitting, 87/58 standing.Pt has no complaints at this time.

## 2019-06-26 NOTE — ED Notes (Signed)
Pt updated on waiting for a repeat lab. Pt given a warm blanket and orange juice. Pt resting in NAD.

## 2019-06-27 ENCOUNTER — Other Ambulatory Visit: Payer: Self-pay

## 2019-06-27 ENCOUNTER — Inpatient Hospital Stay (HOSPITAL_COMMUNITY)
Admit: 2019-06-27 | Discharge: 2019-06-27 | Disposition: A | Discharge status: Home / Self Care | Payer: No Typology Code available for payment source | Attending: Internal Medicine | Admitting: Internal Medicine

## 2019-06-27 DIAGNOSIS — R079 Chest pain, unspecified: Secondary | ICD-10-CM

## 2019-06-27 DIAGNOSIS — R55 Syncope and collapse: Secondary | ICD-10-CM

## 2019-06-27 LAB — CBC
HCT: 42.3 % (ref 39.0–52.0)
Hemoglobin: 14.1 g/dL (ref 13.0–17.0)
MCH: 29.7 pg (ref 26.0–34.0)
MCHC: 33.3 g/dL (ref 30.0–36.0)
MCV: 89.1 fL (ref 80.0–100.0)
Platelets: 232 10*3/uL (ref 150–400)
RBC: 4.75 MIL/uL (ref 4.22–5.81)
RDW: 14 % (ref 11.5–15.5)
WBC: 8.3 10*3/uL (ref 4.0–10.5)
nRBC: 0 % (ref 0.0–0.2)

## 2019-06-27 LAB — BASIC METABOLIC PANEL
Anion gap: 9 (ref 5–15)
BUN: 42 mg/dL — ABNORMAL HIGH (ref 6–20)
CO2: 24 mmol/L (ref 22–32)
Calcium: 8.6 mg/dL — ABNORMAL LOW (ref 8.9–10.3)
Chloride: 102 mmol/L (ref 98–111)
Creatinine, Ser: 2.3 mg/dL — ABNORMAL HIGH (ref 0.61–1.24)
GFR calc Af Amer: 36 mL/min — ABNORMAL LOW (ref >60)
GFR calc non Af Amer: 31 mL/min — ABNORMAL LOW (ref >60)
Glucose, Bld: 134 mg/dL — ABNORMAL HIGH (ref 70–99)
Potassium: 3.9 mmol/L (ref 3.5–5.1)
Sodium: 135 mmol/L (ref 135–145)

## 2019-06-27 LAB — ECHOCARDIOGRAM COMPLETE
Height: 73 in
Weight: 3094.4 oz

## 2019-06-27 LAB — TROPONIN I (HIGH SENSITIVITY)
Troponin I (High Sensitivity): 54 ng/L — ABNORMAL HIGH (ref <18)
Troponin I (High Sensitivity): 64 ng/L — ABNORMAL HIGH (ref <18)

## 2019-06-27 MED ORDER — HYDRALAZINE HCL 20 MG/ML IJ SOLN
10.0000 mg | INTRAMUSCULAR | Status: DC | PRN
  Administered 2019-06-27 – 2019-06-28 (×2): 10 mg via INTRAVENOUS
  Filled 2019-06-27 (×2): qty 1

## 2019-06-27 MED ORDER — SODIUM CHLORIDE 0.9% FLUSH
3.0000 mL | Freq: Two times a day (BID) | INTRAVENOUS | Status: DC
  Administered 2019-06-27 – 2019-06-29 (×3): 3 mL via INTRAVENOUS

## 2019-06-27 MED ORDER — VERAPAMIL HCL 80 MG PO TABS
80.0000 mg | ORAL_TABLET | Freq: Two times a day (BID) | ORAL | Status: DC
  Administered 2019-06-27: 80 mg via ORAL
  Filled 2019-06-27 (×3): qty 1

## 2019-06-27 NOTE — Progress Notes (Signed)
[facility] [place] NAME: Edwin Green    MR#:  [id number]  DATE OF BIRTH:  [birth year]  SUBJECTIVE:  CHIEF COMPLAINT: Patient denies any dizzy spells while resting.  Denies any palpitations  REVIEW OF SYSTEMS:  CONSTITUTIONAL: No fever, fatigue or weakness.  EYES: No blurred or double vision.  EARS, NOSE, AND THROAT: No tinnitus or ear pain.  RESPIRATORY: No cough, shortness of breath, wheezing or hemoptysis.  CARDIOVASCULAR: No chest pain, orthopnea, edema.  GASTROINTESTINAL: No nausea, vomiting, diarrhea or abdominal pain.  GENITOURINARY: No dysuria, hematuria.  ENDOCRINE: No polyuria, nocturia,  HEMATOLOGY: No anemia, easy bruising or bleeding SKIN: No rash or lesion. MUSCULOSKELETAL: No joint pain or arthritis.   NEUROLOGIC: No tingling, numbness, weakness.  PSYCHIATRY: No anxiety or depression.   DRUG ALLERGIES:  No Known Allergies  VITALS:  Blood pressure 121/88, pulse 68, temperature 98 F (36.7 C), temperature source Oral, resp. rate 14, height 6\' 1"  (1.854 m), weight 87.7 kg, SpO2 100 %.  PHYSICAL EXAMINATION:  GENERAL:  58 y.o.-year-old patient lying in the bed with no acute distress.  EYES: Pupils equal, round, reactive to light and accommodation. No scleral icterus. Extraocular muscles intact.  HEENT: Head atraumatic, normocephalic. Oropharynx and nasopharynx clear.  NECK:  Supple, no jugular venous distention. No thyroid enlargement, no tenderness.  LUNGS: Normal breath sounds bilaterally, no wheezing, rales,rhonchi or crepitation. No use of accessory muscles of respiration.  CARDIOVASCULAR: S1, S2 normal. No murmurs, rubs, or gallops.  ABDOMEN: Soft, nontender, nondistended. Bowel sounds present.  EXTREMITIES: No pedal edema, cyanosis, or clubbing.  NEUROLOGIC: Cranial nerves II through XII are intact. Muscle strength at baseline in all extremities. Sensation intact. Gait not checked.  PSYCHIATRIC: The  patient is alert and oriented x 3.  SKIN: No obvious rash, lesion, or ulcer.    LABORATORY PANEL:   CBC Recent Labs  Lab 06/27/19 0200  WBC 8.3  HGB 14.1  HCT 42.3  PLT 232   ------------------------------------------------------------------------------------------------------------------  Chemistries  Recent Labs  Lab 06/26/19 0607 06/26/19 1918 06/27/19 0200  NA 136 138 135  K 3.9 4.0 3.9  CL 103 102 102  CO2 23 24 24   GLUCOSE 110* 146* 134*  BUN 28* 37* 42*  CREATININE 1.55* 2.67* 2.30*  CALCIUM 8.9 9.1 8.6*  MG 2.5*  --   --   AST  --  23  --   ALT  --  24  --   ALKPHOS  --  60  --   BILITOT  --  0.6  --    ------------------------------------------------------------------------------------------------------------------  Cardiac Enzymes No results for input(s): TROPONINI in the last 168 hours. ------------------------------------------------------------------------------------------------------------------  RADIOLOGY:  Dg Chest Port 1 View  Result Date: 06/26/2019 CLINICAL DATA:  Near syncopal episode EXAM: PORTABLE CHEST 1 VIEW COMPARISON:  06/23/2019 FINDINGS: The heart size and mediastinal contours are within normal limits. Both lungs are clear. The visualized skeletal structures are unremarkable. IMPRESSION: No active disease. Electronically Signed   By: Inez Catalina M.D.   On: 06/26/2019 22:39    EKG:   Orders placed or performed during the hospital encounter of 06/26/19  . EKG 12-Lead  . EKG 12-Lead  . EKG 12-Lead  . EKG 12-Lead  . EKG 12-Lead  . EKG 12-Lead    ASSESSMENT AND PLAN:    Syncope -unclear etiology, possible from dehydration/cardiac/vasovagal/arrhythmia Monitor patient on telemetry Cycle cardiac biomarkers Myoview stress test in a.m. Orthostatics and PT consult   Chest pain -  in conjunction with syncope above.  Troponins elevated here.  We will cycle his cardiac enzymes ,get an echocardiogram and a cardiology is recommending  Myoview stress test   Beta-blocker to reduce his heart rate some.    AKI ( -gentle IV fluids tonight, avoid nephrotoxins and monitor for improvement Baseline creatinine at 1.1-2.30    HTN (hypertension) -blood pressure is normotensive here, we will hold antihypertensives for now unless his blood pressure rises    All the records are reviewed and case discussed with Care Management/Social Workerr. Management plans discussed with the patient, family and they are in agreement.  CODE STATUS: fc   TOTAL TIME TAKING CARE OF THIS PATIENT: 39  minutes.   POSSIBLE D/C IN 1-2  DAYS, DEPENDING ON CLINICAL CONDITION.  Note: This dictation was prepared with Dragon dictation along with smaller phrase technology. Any transcriptional errors that result from this process are unintentional.   Ramonita LabAruna Maree Ainley M.D on 06/27/2019 at 3:48 PM  Between 7am to 6pm - Pager - [phone] After 6pm go to [email or web address] - password [email or web address]  [facility]  Office  [phone]  CC: Primary care physician; Patient, No Pcp Per

## 2019-06-27 NOTE — Plan of Care (Signed)
  Problem: Education: Goal: Knowledge of General Education information will improve Description: Including pain rating scale, medication(s)/side effects and non-pharmacologic comfort measures Outcome: Progressing   Problem: Clinical Measurements: Goal: Cardiovascular complication will be avoided Outcome: Progressing   Problem: Activity: Goal: Risk for activity intolerance will decrease Outcome: Progressing   Problem: Safety: Goal: Ability to remain free from injury will improve Outcome: Progressing   

## 2019-06-27 NOTE — Consult Note (Signed)
Cardiology Consultation:   Patient ID: Edwin Green MRN: [id number]; DOB: 10/12/1961  Admit date: 06/26/2019 Date of Consult: 06/27/2019  Primary Care Provider: Patient, No Pcp Per Primary Cardiologist: No primary care provider on file.  Primary Electrophysiologist:  None    Patient Profile:   Edwin Green is a 58 y.o. male with a hx of hypertension who is being seen today for the evaluation of syncope at the request of Oralia ManisDavid Willis.  History of Present Illness:   Mr. [name] is a 58 year old male with a history of hypertension and nosebleeds.  He was recently in the hospital for nosebleeds.  It was thought that this was due to his uncontrolled hypertension and he had not taken his blood pressure medication in years.  His systolic blood pressure was 200s over 130s at the time.  He was discharged with a new blood pressure regimen including lisinopril, hydrochlorothiazide, and amlodipine.  [facility].  He sat down to [facility] blood [facility] and found a systolic blood pressure of 118.  He picked up his medications but felt unusual at the time.  He went to get a drink and while standing at the checkout, had an episode of syncope.  Is unclear as to how long he was unconscious.  The patient states that he remembers someone at the counter yelling at him to wake him up.  He had some mild chest pain with a string-like feeling down his left arm.  He feels well today.  Heart Pathway Score:     Past Medical History:  Diagnosis Date  . Hypertension     Past Surgical History:  Procedure Laterality Date  . NO PAST SURGERIES       Home Medications:  Prior to Admission medications   Medication Sig Start Date End Date Taking? Authorizing Provider  amLODipine (NORVASC) 5 MG tablet Take 1 tablet (5 mg total) by mouth daily. 06/26/19  Yes Sudini, Wardell HeathSrikar, MD  hydrochlorothiazide (HYDRODIURIL) 50 MG tablet Take 1 tablet (50 mg total) by  mouth daily. 06/26/19  Yes Sudini, Wardell HeathSrikar, MD  lisinopril (ZESTRIL) 20 MG tablet Take 1 tablet (20 mg total) by mouth daily. 06/26/19 07/26/19 Yes Milagros LollSudini, Srikar, MD    Inpatient Medications: Scheduled Meds: . enoxaparin (LOVENOX) injection  40 mg Subcutaneous Q24H   Continuous Infusions:  PRN Meds: acetaminophen **OR** acetaminophen, ondansetron **OR** ondansetron (ZOFRAN) IV, oxyCODONE  Allergies:   No Known Allergies  Social History:   Social History   Socioeconomic History  . Marital status: Single    Spouse name: Not on file  . Number of children: Not on file  . Years of education: Not on file  . Highest education level: Not on file  Occupational History  . Not on file  Social Needs  . Financial resource strain: Not on file  . Food insecurity    Worry: Not on file    Inability: Not on file  . Transportation needs    Medical: Not on file    Non-medical: Not on file  Tobacco Use  . Smoking status: Current Some Day Smoker    Types: Cigarettes  . Smokeless tobacco: Never Used  Substance and Sexual Activity  . Alcohol use: Yes  . Drug use: No  . Sexual activity: Not on file  Lifestyle  . Physical activity    Days per week: Not on file    Minutes per session: Not on file  . Stress: Not on file  Relationships  . Social Herbalist on phone: Not on file    Gets together: Not on file    Attends religious service: Not on file    Active member of club or organization: Not on file    Attends meetings of clubs or organizations: Not on file    Relationship status: Not on file  . Intimate partner violence    Fear of current or ex partner: Not on file    Emotionally abused: Not on file    Physically abused: Not on file    Forced sexual activity: Not on file  Other Topics Concern  . Not on file  Social History Narrative  . Not on file    Family History:    Family History  Problem Relation Age of Onset  . Hypertension Mother   . Stroke Father   .  Hypertension Father   . Asthma Son      ROS:  Please see the history of present illness.   All other ROS reviewed and negative.     Physical Exam/Data:   Vitals:   06/26/19 2230 06/26/19 2307 06/27/19 0448 06/27/19 0731  BP: 129/86 (!) 134/93 125/84 121/88  Pulse: 86 88 67 68  Resp: 14     Temp:  98.4 F (36.9 C) 98.1 F (36.7 C) 98 F (36.7 C)  TempSrc:  Oral Oral Oral  SpO2: 96% 100% 99% 100%  Weight:  87.7 kg    Height:  6\' 1"  (1.854 m)      Intake/Output Summary (Last 24 hours) at 06/27/2019 1221 Last data filed at 06/27/2019 0834 Gross per 24 hour  Intake 332.7 ml  Output 500 ml  Net -167.3 ml   Last 3 Weights 06/26/2019 06/26/2019 06/25/2019  Weight (lbs) 193 lb 6.4 oz 180 lb 195 lb 14.4 oz  Weight (kg) 87.726 kg 81.647 kg 88.86 kg     Body mass index is 25.52 kg/m.  General:  Well nourished, well developed, in no acute distress HEENT: normal Lymph: no adenopathy Neck: no JVD Endocrine:  No thryomegaly Vascular: No carotid bruits; FA pulses 2+ bilaterally without bruits  Cardiac:  normal S1, S2; RRR; no murmur  Lungs:  clear to auscultation bilaterally, no wheezing, rhonchi or rales  Abd: soft, nontender, no hepatomegaly  Ext: no edema Musculoskeletal:  No deformities, BUE and BLE strength normal and equal Skin: warm and dry  Neuro:  CNs 2-12 intact, no focal abnormalities noted Psych:  Normal affect   EKG:  The EKG was personally reviewed and demonstrates: Sinus rhythm, LVH Telemetry:  Telemetry was personally reviewed and demonstrates: Sinus rhythm  Relevant CV Studies: TTE pending  Laboratory Data:  High Sensitivity Troponin:   Recent Labs  Lab 06/24/19 0239 06/26/19 1918 06/26/19 2203 06/27/19 0200 06/27/19 0507  TROPONINIHS 14 52* 61* 54* 64*     Cardiac EnzymesNo results for input(s): TROPONINI in the last 168 hours. No results for input(s): TROPIPOC in the last 168 hours.  Chemistry Recent Labs  Lab 06/26/19 0607 06/26/19 1918  06/27/19 0200  NA 136 138 135  K 3.9 4.0 3.9  CL 103 102 102  CO2 23 24 24   GLUCOSE 110* 146* 134*  BUN 28* 37* 42*  CREATININE 1.55* 2.67* 2.30*  CALCIUM 8.9 9.1 8.6*  GFRNONAA 50* 26* 31*  GFRAA 58* 30* 36*  ANIONGAP 10 12 9     Recent Labs  Lab 06/26/19 1918  PROT 7.6  ALBUMIN 4.3  AST 23  ALT 24  ALKPHOS 60  BILITOT 0.6   Hematology Recent Labs  Lab 06/25/19 0312 06/26/19 1918 06/27/19 0200  WBC 7.0 6.6 8.3  RBC 4.67 4.99 4.75  HGB 14.1 14.7 14.1  HCT 42.4 45.0 42.3  MCV 90.8 90.2 89.1  MCH 30.2 29.5 29.7  MCHC 33.3 32.7 33.3  RDW 13.9 14.1 14.0  PLT 224 246 232   BNPNo results for input(s): BNP, PROBNP in the last 168 hours.  DDimer No results for input(s): DDIMER in the last 168 hours.   Radiology/Studies:  [place] Renal  Result Date: 06/24/2019 CLINICAL DATA:  Hypertensive urgency. EXAM: RENAL / URINARY TRACT ULTRASOUND COMPLETE COMPARISON:  None. FINDINGS: Right Kidney: Renal measurements: 10.5 x 4.9 x 5.2 cm = volume: 140 mL . Echogenicity within normal limits. No mass or hydronephrosis visualized. Left Kidney: Renal measurements: 10.3 x 5.5 x 5.4 cm = volume: 150 mL. Echogenicity within normal limits. No mass or hydronephrosis visualized. Bladder: Appears normal for degree of bladder distention. IMPRESSION: Negative examination. Electronically Signed   By: Drusilla Kannerhomas  Dalessio M.D.   On: 06/24/2019 11:59   Dg Chest Port 1 View  Result Date: 06/26/2019 CLINICAL DATA:  Near syncopal episode EXAM: PORTABLE CHEST 1 VIEW COMPARISON:  06/23/2019 FINDINGS: The heart size and mediastinal contours are within normal limits. Both lungs are clear. The visualized skeletal structures are unremarkable. IMPRESSION: No active disease. Electronically Signed   By: Alcide CleverMark  Lukens M.D.   On: 06/26/2019 22:39   Dg Chest Port 1 View  Result Date: 06/23/2019 CLINICAL DATA:  Hypertension. EXAM: PORTABLE CHEST 1 VIEW COMPARISON:  April 07, 2018 FINDINGS: The heart size and mediastinal  contours are within normal limits. Both lungs are clear. The visualized skeletal structures are unremarkable. IMPRESSION: No active disease. Electronically Signed   By: Katherine Mantlehristopher  Green M.D.   On: 06/23/2019 23:53    Assessment and Plan:   1. Syncope: At this point is unclear to me as to the cause of his syncope.  He certainly has signs and symptoms of either a vagal response or an arrhythmia.  That being said, does not endorse palpitations at any point.  Agree with a transthoracic echo.  His troponin is also mildly elevated and he had some strange feeling in his left arm and chest.  I think a Myoview would be a reasonable option for him as well.  We Quinnton Bury continue telemetry.  He may require further monitoring as an outpatient.  It is certainly possible that his blood pressure was low and thus, with his history of uncontrolled hypertension, had an episode of syncope. 2. Hypertension:.  We Halena Mohar continue current medications.  If he has further episodes of dizziness, he may require a slower titration of medications.      For questions or updates, please contact [facility] Please consult [email or web address] for contact info under     Signed, Jud Fanguy Jorja LoaMartin Aarian Cleaver, MD  06/27/2019 12:21 PM

## 2019-06-27 NOTE — Progress Notes (Signed)
Family Meeting Note  Advance Directive:yes  Today a meeting took place with the Patient.     The following clinical team members were present during this meeting:MD  The following were discussed:Patient's diagnosis: Syncope, uncontrolled hypertension, elevated troponin and plan of care discussed in detail with the patient.  Patient verbalized understanding of the plan   patient's progosis: Unable to determine and Goals for treatment: Full Code  Son Broadus John is the healthcare power of attorney  Additional follow-up to be provided: Hospitalist and cardiology  Time spent during discussion:17 min  Nicholes Mango, MD

## 2019-06-28 ENCOUNTER — Inpatient Hospital Stay (HOSPITAL_COMMUNITY): Payer: No Typology Code available for payment source

## 2019-06-28 ENCOUNTER — Encounter: Payer: Self-pay | Admitting: Radiology

## 2019-06-28 DIAGNOSIS — R55 Syncope and collapse: Secondary | ICD-10-CM

## 2019-06-28 DIAGNOSIS — I421 Obstructive hypertrophic cardiomyopathy: Secondary | ICD-10-CM

## 2019-06-28 LAB — BASIC METABOLIC PANEL
Anion gap: 7 (ref 5–15)
BUN: 38 mg/dL — ABNORMAL HIGH (ref 6–20)
CO2: 23 mmol/L (ref 22–32)
Calcium: 8.6 mg/dL — ABNORMAL LOW (ref 8.9–10.3)
Chloride: 106 mmol/L (ref 98–111)
Creatinine, Ser: 1.58 mg/dL — ABNORMAL HIGH (ref 0.61–1.24)
GFR calc Af Amer: 56 mL/min — ABNORMAL LOW (ref >60)
GFR calc non Af Amer: 49 mL/min — ABNORMAL LOW (ref >60)
Glucose, Bld: 100 mg/dL — ABNORMAL HIGH (ref 70–99)
Potassium: 4.4 mmol/L (ref 3.5–5.1)
Sodium: 136 mmol/L (ref 135–145)

## 2019-06-28 LAB — CBC
HCT: 41.9 % (ref 39.0–52.0)
Hemoglobin: 13.8 g/dL (ref 13.0–17.0)
MCH: 30 pg (ref 26.0–34.0)
MCHC: 32.9 g/dL (ref 30.0–36.0)
MCV: 91.1 fL (ref 80.0–100.0)
Platelets: 224 10*3/uL (ref 150–400)
RBC: 4.6 MIL/uL (ref 4.22–5.81)
RDW: 13.8 % (ref 11.5–15.5)
WBC: 5 10*3/uL (ref 4.0–10.5)
nRBC: 0 % (ref 0.0–0.2)

## 2019-06-28 LAB — NM MYOCAR MULTI W/SPECT W/WALL MOTION / EF
Estimated workload: 1 METS
Exercise duration (min): 0 min
Exercise duration (sec): 0 s
LV dias vol: 63 mL (ref 62–150)
LV sys vol: 39 mL
MPHR: 165 {beats}/min
Peak HR: 109 {beats}/min
Percent HR: 66 %
Rest HR: 68 {beats}/min
SDS: 1
SRS: 1
SSS: 2
TID: 0.7

## 2019-06-28 LAB — TROPONIN I (HIGH SENSITIVITY): Troponin I (High Sensitivity): 20 ng/L — ABNORMAL HIGH (ref <18)

## 2019-06-28 MED ORDER — TECHNETIUM TC 99M TETROFOSMIN IV KIT
28.4360 | PACK | Freq: Once | INTRAVENOUS | Status: AC | PRN
  Administered 2019-06-28: 28.436 via INTRAVENOUS

## 2019-06-28 MED ORDER — SODIUM CHLORIDE 0.9 % IV SOLN
INTRAVENOUS | Status: AC
  Administered 2019-06-28 – 2019-06-29 (×2): via INTRAVENOUS

## 2019-06-28 MED ORDER — VERAPAMIL HCL 120 MG PO TABS
120.0000 mg | ORAL_TABLET | Freq: Two times a day (BID) | ORAL | Status: DC
  Administered 2019-06-28 – 2019-06-29 (×2): 120 mg via ORAL
  Filled 2019-06-28 (×3): qty 1

## 2019-06-28 MED ORDER — REGADENOSON 0.4 MG/5ML IV SOLN
0.4000 mg | Freq: Once | INTRAVENOUS | Status: AC
  Administered 2019-06-28: 0.4 mg via INTRAVENOUS
  Filled 2019-06-28: qty 5

## 2019-06-28 MED ORDER — TECHNETIUM TC 99M TETROFOSMIN IV KIT
9.4970 | PACK | Freq: Once | INTRAVENOUS | Status: AC | PRN
  Administered 2019-06-28: 9.497 via INTRAVENOUS

## 2019-06-28 NOTE — Progress Notes (Signed)
[facility] [place] [facility]   PATIENT NAME: Edwin BaileyDaniel Green    MR#:  [id number]  DATE OF BIRTH:  04/26/1961  SUBJECTIVE:  CHIEF COMPLAINT: Patient denies any dizzy spells while resting.  Denies any palpitations. Stress test today  REVIEW OF SYSTEMS:  CONSTITUTIONAL: No fever, fatigue or weakness.  EYES: No blurred or double vision.  EARS, NOSE, AND THROAT: No tinnitus or ear pain.  RESPIRATORY: No cough, shortness of breath, wheezing or hemoptysis.  CARDIOVASCULAR: No chest pain, orthopnea, edema.  GASTROINTESTINAL: No nausea, vomiting, diarrhea or abdominal pain.  GENITOURINARY: No dysuria, hematuria.  ENDOCRINE: No polyuria, nocturia,  HEMATOLOGY: No anemia, easy bruising or bleeding SKIN: No rash or lesion. MUSCULOSKELETAL: No joint pain or arthritis.   NEUROLOGIC: No tingling, numbness, weakness.  PSYCHIATRY: No anxiety or depression.   DRUG ALLERGIES:  No Known Allergies  VITALS:  Blood pressure 133/83, pulse (!) 104, temperature 97.6 F (36.4 C), temperature source Oral, resp. rate 18, height 6\' 1"  (1.854 m), weight 87.7 kg, SpO2 100 %.  PHYSICAL EXAMINATION:  GENERAL:  58 y.o.-year-old patient lying in the bed with no acute distress.  EYES: Pupils equal, round, reactive to light and accommodation. No scleral icterus. Extraocular muscles intact.  HEENT: Head atraumatic, normocephalic. Oropharynx and nasopharynx clear.  NECK:  Supple, no jugular venous distention. No thyroid enlargement, no tenderness.  LUNGS: Normal breath sounds bilaterally, no wheezing, rales,rhonchi or crepitation. No use of accessory muscles of respiration.  CARDIOVASCULAR: S1, S2 normal. No murmurs, rubs, or gallops.  ABDOMEN: Soft, nontender, nondistended. Bowel sounds present.  EXTREMITIES: No pedal edema, cyanosis, or clubbing.  NEUROLOGIC: Cranial nerves II through XII are intact. Muscle strength at baseline in all extremities. Sensation intact. Gait not  checked.  PSYCHIATRIC: The patient is alert and oriented x 3.  SKIN: No obvious rash, lesion, or ulcer.    LABORATORY PANEL:   CBC Recent Labs  Lab 06/28/19 0556  WBC 5.0  HGB 13.8  HCT 41.9  PLT 224   ------------------------------------------------------------------------------------------------------------------  Chemistries  Recent Labs  Lab 06/26/19 0607 06/26/19 1918  06/28/19 0556  NA 136 138   < > 136  K 3.9 4.0   < > 4.4  CL 103 102   < > 106  CO2 23 24   < > 23  GLUCOSE 110* 146*   < > 100*  BUN 28* 37*   < > 38*  CREATININE 1.55* 2.67*   < > 1.58*  CALCIUM 8.9 9.1   < > 8.6*  MG 2.5*  --   --   --   AST  --  23  --   --   ALT  --  24  --   --   ALKPHOS  --  60  --   --   BILITOT  --  0.6  --   --    < > = values in this interval not displayed.   ------------------------------------------------------------------------------------------------------------------  Cardiac Enzymes No results for input(s): TROPONINI in the last 168 hours. ------------------------------------------------------------------------------------------------------------------  RADIOLOGY:  Nm Myocar Multi W/spect W/wall Motion / Ef  Result Date: 06/28/2019  T wave inversion was noted during stress in the II, III, aVF, V5, V6 and I leads.  The study is normal.  This is a low risk study.  The left ventricular ejection fraction is normal (55-65%).    Dg Chest Port 1 View  Result Date: 06/26/2019 CLINICAL DATA:  Near syncopal episode EXAM: PORTABLE CHEST 1 VIEW  COMPARISON:  06/23/2019 FINDINGS: The heart size and mediastinal contours are within normal limits. Both lungs are clear. The visualized skeletal structures are unremarkable. IMPRESSION: No active disease. Electronically Signed   By: Inez Catalina M.D.   On: 06/26/2019 22:39    EKG:   Orders placed or performed during the hospital encounter of 06/26/19  . EKG 12-Lead  . EKG 12-Lead  . EKG 12-Lead  . EKG 12-Lead  . EKG  12-Lead  . EKG 12-Lead    ASSESSMENT AND PLAN:    Syncope -unclear etiology, possible from dehydration/cardiac/vasovagal/arrhythmia Monitor patient on telemetry cardiac biomarkers-[id number] Myoview stress test -low risk study PT consult-no PT needs identified   Chest pain -in conjunction with syncope above.   cardiac biomarkers-54-64-20 Myoview stress test -low risk study Echo with 60 to 65% ejection fraction     AKI ( -gentle IV fluids tonight, avoid nephrotoxins and monitor for improvement Baseline creatinine at 1.1-2.30--1.58    HTN (hypertension) -blood pressure is normotensive here, we will hold antihypertensives for now unless his blood pressure rises    All the records are reviewed and case discussed with Care Management/Social Workerr. Management plans discussed with the patient, family and they are in agreement.  CODE STATUS: fc   TOTAL TIME TAKING CARE OF THIS PATIENT: 39  minutes.   POSSIBLE D/C IN 1-  DAYS, DEPENDING ON CLINICAL CONDITION.  Note: This dictation was prepared with Dragon dictation along with smaller phrase technology. Any transcriptional errors that result from this process are unintentional.   Nicholes Mango M.D on 06/28/2019 at 3:34 PM  Between 7am to 6pm - Pager - [phone] After 6pm go to [email or web address] - password [email or web address] [place] [facility]  Office  [phone]  CC: Primary care physician; Patient, No Pcp Per

## 2019-06-28 NOTE — Plan of Care (Signed)
  Problem: Education: Goal: Knowledge of General Education information will improve Description: Including pain rating scale, medication(s)/side effects and non-pharmacologic comfort measures Outcome: Progressing   Problem: Clinical Measurements: Goal: Will remain free from infection Outcome: Progressing Goal: Diagnostic test results will improve Outcome: Progressing   

## 2019-06-28 NOTE — Evaluation (Signed)
Physical Therapy Evaluation Patient Details Name: Edwin BaileyDaniel Green MRN: [id number] DOB: 07/07/1961 Today's Date: 06/28/2019   History of Present Illness  Patient brought to the ED after near syncopal event.  He was recently admitted here for uncontrolled hypertension and nosebleed.  He was discharged, had returned home, gone to eat, and then was at the pharmacy getting his medications when he began to feel nauseated, diaphoretic.  He then had a syncopal event.  Here in the ED he has some chest discomfort.  He was noted to have some more pronounced T wave inversions on his EKG, and an elevated troponin.  During his last admission his troponin values were negative. He is now complaining of chest discomfort/heaviness after completion of stairs and AMB, he says he is as close to feeling as he did before he had his near syncopal event after exercise.  Clinical Impression  Pt. Is able to communicate clearly, demonstrates strength and mobility WNL independently and able to demonstrate ability to climb 1 flight of stairs that are necessary to access his home as well as AMB 400 ft. His Sx. Increased with exertion but vitals remained WNL. He did not demonstrate any dizziness/nausea with change of position, only with exertion and understands to take increased time upon standing to make sure he is not going to have increased dizziness as well as to sit down immediately if he begins to feel increased symptoms to prevent fall.     Follow Up Recommendations No PT follow up    Equipment Recommendations  None recommended by PT    Recommendations for Other Services       Precautions / Restrictions        Mobility  Bed Mobility Overal bed mobility: Independent                Transfers Overall transfer level: Independent                  Ambulation/Gait Ambulation/Gait assistance: Independent Gait Distance (Feet): 400 Feet Assistive device: Standard walker(2 laps with RW, one with  none) Gait Pattern/deviations: WFL(Within Functional Limits) Gait velocity: WNL   General Gait Details: Pt. demonstrates slight sway when ambulating below his usual cadence but is steady with head turns and during conversation at normal gait speed  Stairs Stairs: Yes Stairs assistance: Independent Stair Management: One rail Right Number of Stairs: 12 General stair comments: Pt. able to complete but does feel taxed following completion, describes chest heaviness  Wheelchair Mobility    Modified Rankin (Stroke Patients Only) Modified Rankin (Stroke Patients Only) Pre-Morbid Rankin Score: No symptoms Modified Rankin: No significant disability     Balance Overall balance assessment: No apparent balance deficits (not formally assessed)                                           Pertinent Vitals/Pain Pain Assessment: No/denies pain    Home Living Family/patient expects to be discharged to:: Private residence Living Arrangements: Alone   Type of Home: House Home Access: Stairs to enter Entrance Stairs-Rails: Right Entrance Stairs-Number of Steps: 12 Home Layout: Two level Home Equipment: None      Prior Function Level of Independence: Independent         Comments: Lives in a house with "neighbors" that have their "own problems" but would be able to hear if he yelled for help.     Hand  Dominance        Extremity/Trunk Assessment   Upper Extremity Assessment Upper Extremity Assessment: Overall WFL for tasks assessed    Lower Extremity Assessment Lower Extremity Assessment: Overall WFL for tasks assessed    Cervical / Trunk Assessment Cervical / Trunk Assessment: Normal  Communication   Communication: No difficulties  Cognition Arousal/Alertness: Awake/alert Behavior During Therapy: WFL for tasks assessed/performed Overall Cognitive Status: Within Functional Limits for tasks assessed                                 General  Comments: Pt. is nervous about being D/C'd and not having the support he would need if he had a problem      General Comments      Exercises     Assessment/Plan    PT Assessment Patent does not need any further PT services  PT Problem List         PT Treatment Interventions      PT Goals (Current goals can be found in the Care Plan section)       Frequency     Barriers to discharge        Co-evaluation               AM-PAC PT "6 Clicks" Mobility  Outcome Measure Help needed turning from your back to your side while in a flat bed without using bedrails?: None Help needed moving from lying on your back to sitting on the side of a flat bed without using bedrails?: None Help needed moving to and from a bed to a chair (including a wheelchair)?: None Help needed standing up from a chair using your arms (e.g., wheelchair or bedside chair)?: None Help needed to walk in hospital room?: None Help needed climbing 3-5 steps with a railing? : None 6 Click Score: 24    End of Session Equipment Utilized During Treatment: Gait belt Activity Tolerance: Patient tolerated treatment well(noted increased feeling of discomfort/ chest heaviness following sessions though vitals showed no concerning jumps) Patient left: in bed;with bed alarm set;with call bell/phone within reach Nurse Communication: Mobility status(Pt. c/o chest heaviness following PT though moving well) PT Visit Diagnosis: Dizziness and giddiness (R42)    Time: 1420-1450 PT Time Calculation (min) (ACUTE ONLY): 30 min   Charges:   PT Evaluation $PT Eval Low Complexity: 1 Low PT Treatments $Gait Training: 8-22 mins        Willa Rough DPT, ATC  Willa Rough 06/28/2019, 3:16 PM

## 2019-06-28 NOTE — Progress Notes (Signed)
Progress Note  Patient Name: Edwin Green Date of Encounter: 06/28/2019  Primary Cardiologist: No primary care provider on file.   Subjective   He feels better overall.  He had Lexiscan Myoview today which was normal.  Inpatient Medications    Scheduled Meds:  enoxaparin (LOVENOX) injection  40 mg Subcutaneous Q24H   sodium chloride flush  3 mL Intravenous Q12H   verapamil  120 mg Oral Q12H   Continuous Infusions:  sodium chloride     PRN Meds: acetaminophen **OR** acetaminophen, ondansetron **OR** ondansetron (ZOFRAN) IV, oxyCODONE   Vital Signs    Vitals:   06/28/19 0738 06/28/19 1254 06/28/19 1344 06/28/19 1453  BP: (!) 137/97 (!) 158/104 136/88 133/83  Pulse: 84 85 96 (!) 104  Resp:      Temp: 97.8 F (36.6 C) 98 F (36.7 C)  97.6 F (36.4 C)  TempSrc: Oral Oral  Oral  SpO2: 100% 100%  100%  Weight:      Height:        Intake/Output Summary (Last 24 hours) at 06/28/2019 1600 Last data filed at 06/28/2019 1454 Gross per 24 hour  Intake 3 ml  Output 2600 ml  Net -2597 ml   Last 3 Weights 06/26/2019 06/26/2019 06/25/2019  Weight (lbs) 193 lb 6.4 oz 180 lb 195 lb 14.4 oz  Weight (kg) 87.726 kg 81.647 kg 88.86 kg      Telemetry    Sinus tachycardia- Personally Reviewed  ECG    Sinus rhythm with LVH and severe repolarization abnormalities- Personally Reviewed  Physical Exam   GEN: No acute distress.   Neck: No JVD Cardiac: RRR, mildly tachycardic.  Double apical impulse, 2 out of 6 systolic murmur at the left sternal border louder with Valsalva. Respiratory: Clear to auscultation bilaterally. GI: Soft, nontender, non-distended  MS: No edema; No deformity. Neuro:  Nonfocal  Psych: Normal affect   Labs    High Sensitivity Troponin:   Recent Labs  Lab 06/26/19 1918 06/26/19 2203 06/27/19 0200 06/27/19 0507 06/28/19 1310  TROPONINIHS 52* 61* 54* 64* 20*      Cardiac EnzymesNo results for input(s): TROPONINI in the last 168 hours. No  results for input(s): TROPIPOC in the last 168 hours.   Chemistry Recent Labs  Lab 06/26/19 1918 06/27/19 0200 06/28/19 0556  NA 138 135 136  K 4.0 3.9 4.4  CL 102 102 106  CO2 24 24 23   GLUCOSE 146* 134* 100*  BUN 37* 42* 38*  CREATININE 2.67* 2.30* 1.58*  CALCIUM 9.1 8.6* 8.6*  PROT 7.6  --   --   ALBUMIN 4.3  --   --   AST 23  --   --   ALT 24  --   --   ALKPHOS 60  --   --   BILITOT 0.6  --   --   GFRNONAA 26* 31* 49*  GFRAA 30* 36* 56*  ANIONGAP 12 9 7      Hematology Recent Labs  Lab 06/26/19 1918 06/27/19 0200 06/28/19 0556  WBC 6.6 8.3 5.0  RBC 4.99 4.75 4.60  HGB 14.7 14.1 13.8  HCT 45.0 42.3 41.9  MCV 90.2 89.1 91.1  MCH 29.5 29.7 30.0  MCHC 32.7 33.3 32.9  RDW 14.1 14.0 13.8  PLT 246 232 224    BNPNo results for input(s): BNP, PROBNP in the last 168 hours.   DDimer No results for input(s): DDIMER in the last 168 hours.   Radiology    Nm Myocar Multi  W/spect W/wall Motion / Ef  Result Date: 06/28/2019  T wave inversion was noted during stress in the II, III, aVF, V5, V6 and I leads.  The study is normal.  This is a low risk study.  The left ventricular ejection fraction is normal (55-65%).    Dg Chest Port 1 View  Result Date: 06/26/2019 CLINICAL DATA:  Near syncopal episode EXAM: PORTABLE CHEST 1 VIEW COMPARISON:  06/23/2019 FINDINGS: The heart size and mediastinal contours are within normal limits. Both lungs are clear. The visualized skeletal structures are unremarkable. IMPRESSION: No active disease. Electronically Signed   By: Alcide CleverMark  Lukens M.D.   On: 06/26/2019 22:39    Cardiac Studies   I personally reviewed echocardiogram done yesterday which showed hyperdynamic LV systolic function with severe left ventricular hypertrophy more in the septum than the posterior wall with near cavity obliteration in systole and mild LVOT gradient at rest.  Valsalva was not performed.  I personally reviewed Lexiscan Myoview from today which showed no  evidence of ischemia with normal ejection fraction.  Patient Profile     58 y.o. male with prolonged history of uncontrolled hypertension who presented with a syncopal episode after discharge the day before from the hospital for hypertensive urgency and nosebleed.  He was discharged home on new medications including lisinopril, hydrochlorothiazide and amlodipine.  Assessment & Plan    1. Syncope: I suspect that the patient has underlying hypertrophic cardiomyopathy based on physical exam findings, EKG and echo.  Vasodilators and diuretics are relatively contraindicated and he was recently discharged on these medications.  In addition, his labs on presentation showed acute renal failure likely due to volume depletion. I suspect that his syncope was due to volume depletion in the setting of previously undiagnosed hypertrophic cardiomyopathy.  Obviously, arrhythmia cannot be completely excluded and this will need to be monitored.  For now, recommend avoiding diuretics and vasodilators for hypertension.  Verapamil is a good choice and we have to uptitrate the dose to get his heart rate down.  I increased the dose 120 mg twice daily.  I discontinued IV hydralazine.   I recommend 2-week outpatient telemetry upon discharge.  2.  Essential hypertension: Recommend gradual up titration of verapamil as tolerated.  If additional blood pressure control is needed, an ARB can be considered at a small dose.  Should definitely avoid diuretics and hydralazine.  3.  Mildly elevated troponin, likely supply demand ischemia.  Lexiscan Myoview was normal.      For questions or updates, please contact [facility] Please consult [email or web address] for contact info under        Signed, Lorine BearsMuhammad Brook Geraci, MD  06/28/2019, 4:00 PM

## 2019-06-28 NOTE — Plan of Care (Signed)
  Problem: Education: Goal: Knowledge of General Education information will improve Description: Including pain rating scale, medication(s)/side effects and non-pharmacologic comfort measures Outcome: Progressing   Problem: Clinical Measurements: Goal: Diagnostic test results will improve Outcome: Progressing Goal: Respiratory complications will improve Outcome: Progressing Goal: Cardiovascular complication will be avoided Outcome: Progressing   Problem: Activity: Goal: Risk for activity intolerance will decrease Outcome: Progressing   

## 2019-06-29 ENCOUNTER — Inpatient Hospital Stay: Payer: No Typology Code available for payment source

## 2019-06-29 ENCOUNTER — Telehealth: Payer: Self-pay | Admitting: *Deleted

## 2019-06-29 ENCOUNTER — Encounter: Payer: Self-pay | Admitting: Physician Assistant

## 2019-06-29 DIAGNOSIS — R55 Syncope and collapse: Secondary | ICD-10-CM

## 2019-06-29 DIAGNOSIS — I422 Other hypertrophic cardiomyopathy: Secondary | ICD-10-CM

## 2019-06-29 LAB — BASIC METABOLIC PANEL
Anion gap: 8 (ref 5–15)
BUN: 32 mg/dL — ABNORMAL HIGH (ref 6–20)
CO2: 21 mmol/L — ABNORMAL LOW (ref 22–32)
Calcium: 8.3 mg/dL — ABNORMAL LOW (ref 8.9–10.3)
Chloride: 107 mmol/L (ref 98–111)
Creatinine, Ser: 1.39 mg/dL — ABNORMAL HIGH (ref 0.61–1.24)
GFR calc Af Amer: >60 mL/min (ref >60)
GFR calc non Af Amer: 57 mL/min — ABNORMAL LOW (ref >60)
Glucose, Bld: 99 mg/dL (ref 70–99)
Potassium: 4.4 mmol/L (ref 3.5–5.1)
Sodium: 136 mmol/L (ref 135–145)

## 2019-06-29 MED ORDER — VERAPAMIL HCL 120 MG PO TABS: 120.0000 mg | ORAL_TABLET | Freq: Two times a day (BID) | ORAL | 0 refills | Status: DC

## 2019-06-29 NOTE — Telephone Encounter (Signed)
-----   Message from Arvil Chaco, PA-C sent at 06/29/2019  3:17 PM EDT ----- Regarding: LIVE ZIO Hello,  This patient is currently admitted at [facility] for syncope and will need a 14-day Live ZIO monitor placed before discharge.  Is someone able to run up and get this placed for him before he leaves?  He will likely be discharged later today.   He is in room 255.  Thank you!

## 2019-06-29 NOTE — Discharge Summary (Signed)
[facility] -  at [facility]   PATIENT NAME: Edwin Green    MR#:  [id number]  DATE OF BIRTH:  01/27/1961  DATE OF ADMISSION:  06/26/2019 ADMITTING PHYSICIAN: Oralia Manisavid Willis, MD  DATE OF DISCHARGE: 06/29/19   PRIMARY CARE PHYSICIAN: Patient, No Pcp Per    ADMISSION DIAGNOSIS:  Chest pain [R07.9] Syncope, unspecified syncope type [R55]  DISCHARGE DIAGNOSIS:  Principal Problem:   Syncope Active Problems:   HTN (hypertension)   Chest pain   AKI (acute kidney injury) (HCC)   SECONDARY DIAGNOSIS:   Past Medical History:  Diagnosis Date  . Hypertension     HOSPITAL COURSE:   Edwin Green  is a 58 y.o. male who presents with chief complaint as above.  Patient brought to the ED after syncopal event.  He was recently admitted here for uncontrolled hypertension and nosebleed.  He was discharged and was at the pharmacy getting his medications when he began to feel nauseated, diaphoretic.  He then had a syncopal event.  Here in the ED he has some chest discomfort.  He was noted to have some more pronounced T wave inversions on his EKG, and an elevated troponin.  During his last admission his troponin values were negative.  Hospitalist were called for admission  Syncope -unclear etiology, possible secondary to dehydration//arrhythmia, suspicion for underlying hypertrophic cardiomyopathy based on the physical exam findings, EKG and echo Monitored patient on telemetry.  Cardiology is recommending outpatient telemetry monitoring for 2 weeks cardiac biomarkers-[id number] Myoview stress test -low risk study PT consult-no PT needs identified Cardiology is recommending to avoid diuretics and vasodilators.  Chest pain -in conjunction with syncope above.  cardiac biomarkers-54-64-20 Myoview stress test -low risk study Echo with 60 to 65% ejection fraction Seen by cardiology recommending outpatient telemetry monitoring for 2 weeks   AKI ( -gentle  IV fluids tonight, avoid nephrotoxins and monitor for improvement Baseline creatinine at 1.1-2.30--1.58-1.36 Discontinued hydrochlorothiazide, lisinopril  HTN (hypertension) -avoid diuretics and vasodilators including hydralazine. Patient is on meropenem will titrate dose as needed and can consider adding ARB in small dose if needed  Work excuse will be given for 1 week and patient need to be reassessed by primary care physician and cardiology following that  PT - no needs identified   DISCHARGE CONDITIONS:  Stable   CONSULTS OBTAINED:  Treatment Team:  Antonieta IbaGollan, Timothy J, MD   PROCEDURES stress test   DRUG ALLERGIES:  No Known Allergies  DISCHARGE MEDICATIONS:   Allergies as of 06/29/2019   No Known Allergies     Medication List    STOP taking these medications   amLODipine 5 MG tablet Commonly known as: NORVASC   hydrochlorothiazide 50 MG tablet Commonly known as: HYDRODIURIL   lisinopril 20 MG tablet Commonly known as: ZESTRIL     TAKE these medications   verapamil 120 MG tablet Commonly known as: CALAN Take 1 tablet (120 mg total) by mouth every 12 (twelve) hours.        DISCHARGE INSTRUCTIONS:  Follow-up with primary care physician in 3 days Follow-up with cardiology Dr. Kirke CorinArida in 5 to 6 days.  Please follow-up with the cardiology office for outpatient telemetry monitoring for the next 2 weeks   DIET:  Cardiac diet  DISCHARGE CONDITION:  Fair  ACTIVITY:  Activity as tolerated  OXYGEN:  Home Oxygen: No.   Oxygen Delivery: room air  DISCHARGE LOCATION:  home   If you experience worsening of your admission symptoms, develop shortness of breath,  life threatening emergency, suicidal or homicidal thoughts you must seek medical attention immediately by calling 911 or calling your MD immediately  if symptoms less severe.  You Must read complete instructions/literature along with all the possible adverse reactions/side effects for all the  Medicines you take and that have been prescribed to you. Take any new Medicines after you have completely understood and accpet all the possible adverse reactions/side effects.   Please note  You were cared for by a hospitalist during your hospital stay. If you have any questions about your discharge medications or the care you received while you were in the hospital after you are discharged, you can call the unit and asked to speak with the hospitalist on call if the hospitalist that took care of you is not available. Once you are discharged, your primary care physician will handle any further medical issues. Please note that NO REFILLS for any discharge medications will be authorized once you are discharged, as it is imperative that you return to your primary care physician (or establish a relationship with a primary care physician if you do not have one) for your aftercare needs so that they can reassess your need for medications and monitor your lab values.     Today  Chief Complaint  Patient presents with  . Near Syncope   Patient is feeling much better today.  Agreeable to go home.  Okay to discharge patient from cardiology standpoint.  Cardiology is recommending outpatient telemetry monitoring for the next 2 weeks.  ROS:  CONSTITUTIONAL: Denies fevers, chills. Denies any fatigue, weakness.  EYES: Denies blurry vision, double vision, eye pain. EARS, NOSE, THROAT: Denies tinnitus, ear pain, hearing loss. RESPIRATORY: Denies cough, wheeze, shortness of breath.  CARDIOVASCULAR: Denies chest pain, palpitations, edema.  GASTROINTESTINAL: Denies nausea, vomiting, diarrhea, abdominal pain. Denies bright red blood per rectum. GENITOURINARY: Denies dysuria, hematuria. ENDOCRINE: Denies nocturia or thyroid problems. HEMATOLOGIC AND LYMPHATIC: Denies easy bruising or bleeding. SKIN: Denies rash or lesion. MUSCULOSKELETAL: Denies pain in neck, back, shoulder, knees, hips or arthritic symptoms.   NEUROLOGIC: Denies paralysis, paresthesias.  PSYCHIATRIC: Denies anxiety or depressive symptoms.   VITAL SIGNS:  Blood pressure (!) 138/93, pulse 69, temperature 98 F (36.7 C), temperature source Oral, resp. rate 18, height 6\' 1"  (1.854 m), weight 87.7 kg, SpO2 100 %.  I/O:    Intake/Output Summary (Last 24 hours) at 06/29/2019 1438 Last data filed at 06/29/2019 1437 Gross per 24 hour  Intake 1349.57 ml  Output 1450 ml  Net -100.43 ml    PHYSICAL EXAMINATION:  GENERAL:  58 y.o.-year-old patient lying in the bed with no acute distress.  EYES: Pupils equal, round, reactive to light and accommodation. No scleral icterus. Extraocular muscles intact.  HEENT: Head atraumatic, normocephalic. Oropharynx and nasopharynx clear.  NECK:  Supple, no jugular venous distention. No thyroid enlargement, no tenderness.  LUNGS: Normal breath sounds bilaterally, no wheezing, rales,rhonchi or crepitation. No use of accessory muscles of respiration.  CARDIOVASCULAR: S1, S2 normal. No murmurs, rubs, or gallops.  ABDOMEN: Soft, non-tender, non-distended. Bowel sounds present.  EXTREMITIES: No pedal edema, cyanosis, or clubbing.  NEUROLOGIC: Cranial nerves II through XII are intact. Muscle strength 5/5 in all extremities. Sensation intact. Gait not checked.  PSYCHIATRIC: The patient is alert and oriented x 3.  SKIN: No obvious rash, lesion, or ulcer.   DATA REVIEW:   CBC Recent Labs  Lab 06/28/19 0556  WBC 5.0  HGB 13.8  HCT 41.9  PLT 224  Chemistries  Recent Labs  Lab 06/26/19 0607 06/26/19 1918  06/29/19 0542  NA 136 138   < > 136  K 3.9 4.0   < > 4.4  CL 103 102   < > 107  CO2 23 24   < > 21*  GLUCOSE 110* 146*   < > 99  BUN 28* 37*   < > 32*  CREATININE 1.55* 2.67*   < > 1.39*  CALCIUM 8.9 9.1   < > 8.3*  MG 2.5*  --   --   --   AST  --  23  --   --   ALT  --  24  --   --   ALKPHOS  --  60  --   --   BILITOT  --  0.6  --   --    < > = values in this interval not  displayed.    Cardiac Enzymes No results for input(s): TROPONINI in the last 168 hours.  Microbiology Results  Results for orders placed or performed during the hospital encounter of 06/23/19  SARS Coronavirus 2 (CEPHEID - Performed in [facility] hospital lab), Hosp Order     Status: None   Collection Time: 06/24/19  1:17 AM   Specimen: Nasopharyngeal Swab  Result Value Ref Range Status   SARS Coronavirus 2 NEGATIVE NEGATIVE Final    Comment: (NOTE) If result is NEGATIVE SARS-CoV-2 target nucleic acids are NOT DETECTED. The SARS-CoV-2 RNA is generally detectable in upper and lower  respiratory specimens during the acute phase of infection. The lowest  concentration of SARS-CoV-2 viral copies this assay can detect is 250  copies / mL. A negative result does not preclude SARS-CoV-2 infection  and should not be used as the sole basis for treatment or other  patient management decisions.  A negative result may occur with  improper specimen collection / handling, submission of specimen other  than nasopharyngeal swab, presence of viral mutation(s) within the  areas targeted by this assay, and inadequate number of viral copies  (<250 copies / mL). A negative result must be combined with clinical  observations, patient history, and epidemiological information. If result is POSITIVE SARS-CoV-2 target nucleic acids are DETECTED. The SARS-CoV-2 RNA is generally detectable in upper and lower  respiratory specimens dur ing the acute phase of infection.  Positive  results are indicative of active infection with SARS-CoV-2.  Clinical  correlation with patient history and other diagnostic information is  necessary to determine patient infection status.  Positive results do  not rule out bacterial infection or co-infection with other viruses. If result is PRESUMPTIVE POSTIVE SARS-CoV-2 nucleic acids MAY BE PRESENT.   A presumptive positive result was obtained on the submitted specimen  and  confirmed on repeat testing.  While 2019 novel coronavirus  (SARS-CoV-2) nucleic acids may be present in the submitted sample  additional confirmatory testing may be necessary for epidemiological  and / or clinical management purposes  to differentiate between  SARS-CoV-2 and other Sarbecovirus currently known to infect humans.  If clinically indicated additional testing with an alternate test  methodology [phone]([id number]) is advised. The SARS-CoV-2 RNA is generally  detectable in upper and lower respiratory sp ecimens during the acute  phase of infection. The expected result is Negative. Fact Sheet for Patients:  BoilerBrush.com.cyhttps://www.fda.gov/media/136312/download Fact Sheet for Healthcare Providers: https://pope.com/https://www.fda.gov/media/136313/download This test is not yet approved or cleared by the [place] FDA and has been authorized for detection and/or diagnosis of SARS-CoV-2  by FDA under an Emergency Use Authorization (EUA).  This EUA will remain in effect (meaning this test can be used) for the duration of the COVID-19 declaration under Section 564(b)(1) of the Act, 21 U.S.C. section 360bbb-3(b)(1), unless the authorization is terminated or revoked sooner. Performed at [facility], [address] [place], [place] [address]   MRSA PCR Screening     Status: None   Collection Time: 06/24/19  4:17 AM   Specimen: Nasopharyngeal  Result Value Ref Range Status   MRSA by PCR NEGATIVE NEGATIVE Final    Comment:        The GeneXpert MRSA Assay (FDA approved for NASAL specimens only), is one component of a comprehensive MRSA colonization surveillance program. It is not intended to diagnose MRSA infection nor to guide or monitor treatment for MRSA infections. Performed at [facility], [address] [place], [place] [address]     RADIOLOGY:  Nm Myocar Multi W/spect W/wall Motion / Ef  Result Date: 06/28/2019  T wave inversion was noted during stress in the II, III, aVF, V5,  V6 and I leads.  The study is normal.  This is a low risk study.  The left ventricular ejection fraction is normal (55-65%).    Dg Chest Port 1 View  Result Date: 06/26/2019 CLINICAL DATA:  Near syncopal episode EXAM: PORTABLE CHEST 1 VIEW COMPARISON:  06/23/2019 FINDINGS: The heart size and mediastinal contours are within normal limits. Both lungs are clear. The visualized skeletal structures are unremarkable. IMPRESSION: No active disease. Electronically Signed   By: Alcide CleverMark  Lukens M.D.   On: 06/26/2019 22:39    EKG:   Orders placed or performed during the hospital encounter of 06/26/19  . EKG 12-Lead  . EKG 12-Lead  . EKG 12-Lead  . EKG 12-Lead  . EKG 12-Lead  . EKG 12-Lead      Management plans discussed with the patient, family and they are in agreement.  CODE STATUS:     Code Status Orders  (From admission, onward)         Start     Ordered   06/26/19 2311  Full code  Continuous     06/26/19 2311        Code Status History    Date Active Date Inactive Code Status Order ID Comments User Context   06/24/2019 0414 06/26/2019 1851 Full Code [id number]  Oralia ManisWillis, David, MD Inpatient   Advance Care Planning Activity      TOTAL TIME TAKING CARE OF THIS PATIENT: 45  minutes.   Note: This dictation was prepared with Dragon dictation along with smaller phrase technology. Any transcriptional errors that result from this process are unintentional.   @MEC @  on 06/29/2019 at 2:38 PM  Between 7am to 6pm - Pager - [phone]  After 6pm go to [email or web address] - password [email or web address]  [facility]  Office  [phone]  CC: Primary care physician; Patient, No Pcp Per

## 2019-06-29 NOTE — TOC Transition Note (Signed)
Transition of Care [facility]) - CM/SW Discharge Note   Patient Details  Name: Edwin Green MRN: [id number] Date of Birth: 01/19/1961  [facility] CM/SW Contact:  Ross Ludwig, LCSW Phone Number: 06/29/2019, 5:17 PM   Clinical Narrative:     Patient is a 58 year old male who is alert and oriented x4.  Patient states he lives alone, he also works two jobs.  Patient stated he is trying to get disability and has started the process.  Patient inquired if there was any way this CSW can help speed up the process for getting disability, CSW informed him that unfortunately, hospital CSW can not assist.  CSW provided contact information for social security office, and [place] to see if they can assist.  CSW asked if patient is able to pay for his medication, patient stated no, he just spent his last 17 dollars on the medication that he discharged on last time he was hospitalized.  CSW tried calling the medication management clinic to see if they have his med in stock and they said no.  CSW was able to provide a match letter so patient's medication will only be 3 dollars.  Patient also informed this CSW that he needed a ride to get back home, CSW provided patient with a cab voucher.  Patient was appreciative of assistance given.  Patient does not have any other needs or concerns.  Final next level of care: Home/Self Care Barriers to Discharge: No Barriers Identified   Patient Goals and CMS Choice Patient states their goals for this hospitalization and ongoing recovery are:: To return back home      Discharge Placement  Patient discharging back home.                     Discharge Plan and Services                DME Arranged: N/A         HH Arranged: NA          Social Determinants of Health (SDOH) Interventions     Readmission Risk Interventions No flowsheet data found.

## 2019-06-29 NOTE — Discharge Summary (Signed)
[employer] [facility] [place] [facility]   PATIENT NAME: Edwin BaileyDaniel Green    MR#:  [id number]  DATE OF BIRTH:  12/18/1960  DATE OF ADMISSION:  06/23/2019 ADMITTING PHYSICIAN: Arnaldo NatalMichael S Diamond, MD  DATE OF DISCHARGE: 06/26/2019  2:35 PM  PRIMARY CARE PHYSICIAN: Patient, No Pcp Per   ADMISSION DIAGNOSIS:  Epistaxis [R04.0] Hypertensive urgency [I16.0]  DISCHARGE DIAGNOSIS:  Principal Problem:   HTN (hypertension) Active Problems:   Nosebleed   Hypertensive urgency   SECONDARY DIAGNOSIS:   Past Medical History:  Diagnosis Date  . Hypertension      ADMITTING HISTORY  HISTORY OF PRESENT ILLNESS:  Edwin BaileyDaniel Green  is a 58 y.o. male who presents with chief complaint as above.  Patient presents the ED with a complaint of nosebleed and elevated blood pressure.  He states that he has had high blood pressure for some time.  He used to be on medication for this, but has not taken any of that medication for a while, at least several months.  On arrival to the ED today his blood pressure was systolic 200s over diastolic 130s.  He had a difficult to control nosebleed which required packing.  He states his nosebleed started early in the morning, and recurred a couple times throughout the day, and in the evening he was unable to get it to stop.  He received multiple doses of antihypertensives in the ED without significant effect on his blood pressure.  Hospitalist were called for admission and further treatment   HOSPITAL COURSE:   *Hypertensive urgency.  Patient was admitted to ICU and started on nicardipine drip.  He is noncompliant with medications.  He was restarted on his hydrochlorothiazide and lisinopril.  Blood pressure continued to remain elevated and I added Norvasc.  Blood pressure improved.  He is being discharged home after he is requested to be discharged home.  *Epistaxis.  Nasal packing was done into the left nostril and epistaxis resolved.  Packing removed and  patient is stable.  No further bleeding.  High risk for readmission due to noncompliance with medications.  CONSULTS OBTAINED:    DRUG ALLERGIES:  No Known Allergies  DISCHARGE MEDICATIONS:   Allergies as of 06/26/2019   No Known Allergies     Medication List    TAKE these medications   amLODipine 5 MG tablet Commonly known as: NORVASC Take 1 tablet (5 mg total) by mouth daily.   hydrochlorothiazide 50 MG tablet Commonly known as: HYDRODIURIL Take 1 tablet (50 mg total) by mouth daily.   lisinopril 20 MG tablet Commonly known as: ZESTRIL Take 1 tablet (20 mg total) by mouth daily.       Today   VITAL SIGNS:  Blood pressure (!) 157/96, pulse 86, temperature 98 F (36.7 C), temperature source Oral, resp. rate 19, height 6\' 1"  (1.854 m), weight 88.9 kg, SpO2 100 %.  I/O:  No intake or output data in the 24 hours ending 06/29/19 1647  PHYSICAL EXAMINATION:  Physical Exam  GENERAL:  58 y.o.-year-old patient lying in the bed with no acute distress.  LUNGS: Normal breath sounds bilaterally, no wheezing, rales,rhonchi or crepitation. No use of accessory muscles of respiration.  CARDIOVASCULAR: S1, S2 normal. No murmurs, rubs, or gallops.  ABDOMEN: Soft, non-tender, non-distended. Bowel sounds present. No organomegaly or mass.  NEUROLOGIC: Moves all 4 extremities. PSYCHIATRIC: The patient is alert and oriented x 3.  SKIN: No obvious rash, lesion, or ulcer.   DATA REVIEW:   CBC Recent  Labs  Lab 06/28/19 0556  WBC 5.0  HGB 13.8  HCT 41.9  PLT 224    Chemistries  Recent Labs  Lab 06/26/19 0607 06/26/19 1918  06/29/19 0542  NA 136 138   < > 136  K 3.9 4.0   < > 4.4  CL 103 102   < > 107  CO2 23 24   < > 21*  GLUCOSE 110* 146*   < > 99  BUN 28* 37*   < > 32*  CREATININE 1.55* 2.67*   < > 1.39*  CALCIUM 8.9 9.1   < > 8.3*  MG 2.5*  --   --   --   AST  --  23  --   --   ALT  --  24  --   --   ALKPHOS  --  60  --   --   BILITOT  --  0.6  --   --    <  > = values in this interval not displayed.    Cardiac Enzymes No results for input(s): TROPONINI in the last 168 hours.  Microbiology Results  Results for orders placed or performed during the hospital encounter of 06/23/19  SARS Coronavirus 2 (CEPHEID - Performed in [facility] hospital lab), Hosp Order     Status: None   Collection Time: 06/24/19  1:17 AM   Specimen: Nasopharyngeal Swab  Result Value Ref Range Status   SARS Coronavirus 2 NEGATIVE NEGATIVE Final    Comment: (NOTE) If result is NEGATIVE SARS-CoV-2 target nucleic acids are NOT DETECTED. The SARS-CoV-2 RNA is generally detectable in upper and lower  respiratory specimens during the acute phase of infection. The lowest  concentration of SARS-CoV-2 viral copies this assay can detect is 250  copies / mL. A negative result does not preclude SARS-CoV-2 infection  and should not be used as the sole basis for treatment or other  patient management decisions.  A negative result may occur with  improper specimen collection / handling, submission of specimen other  than nasopharyngeal swab, presence of viral mutation(s) within the  areas targeted by this assay, and inadequate number of viral copies  (<250 copies / mL). A negative result must be combined with clinical  observations, patient history, and epidemiological information. If result is POSITIVE SARS-CoV-2 target nucleic acids are DETECTED. The SARS-CoV-2 RNA is generally detectable in upper and lower  respiratory specimens dur ing the acute phase of infection.  Positive  results are indicative of active infection with SARS-CoV-2.  Clinical  correlation with patient history and other diagnostic information is  necessary to determine patient infection status.  Positive results do  not rule out bacterial infection or co-infection with other viruses. If result is PRESUMPTIVE POSTIVE SARS-CoV-2 nucleic acids MAY BE PRESENT.   A presumptive positive result was obtained on  the submitted specimen  and confirmed on repeat testing.  While 2019 novel coronavirus  (SARS-CoV-2) nucleic acids may be present in the submitted sample  additional confirmatory testing may be necessary for epidemiological  and / or clinical management purposes  to differentiate between  SARS-CoV-2 and other Sarbecovirus currently known to infect humans.  If clinically indicated additional testing with an alternate test  methodology [phone]([id number]) is advised. The SARS-CoV-2 RNA is generally  detectable in upper and lower respiratory sp ecimens during the acute  phase of infection. The expected result is Negative. Fact Sheet for Patients:  BoilerBrush.com.cyhttps://www.fda.gov/media/136312/download Fact Sheet for Healthcare Providers: https://pope.com/https://www.fda.gov/media/136313/download This test is  not yet approved or cleared by the [place] and has been authorized for detection and/or diagnosis of SARS-CoV-2 by FDA under an Emergency Use Authorization (EUA).  This EUA will remain in effect (meaning this test can be used) for the duration of the COVID-19 declaration under Section 564(b)(1) of the Act, 21 U.S.C. section 360bbb-3(b)(1), unless the authorization is terminated or revoked sooner. Performed at [facility], [address] [place], [place] [address]   MRSA PCR Screening     Status: None   Collection Time: 06/24/19  4:17 AM   Specimen: Nasopharyngeal  Result Value Ref Range Status   MRSA by PCR NEGATIVE NEGATIVE Final    Comment:        The GeneXpert MRSA Assay (FDA approved for NASAL specimens only), is one component of a comprehensive MRSA colonization surveillance program. It is not intended to diagnose MRSA infection nor to guide or monitor treatment for MRSA infections. Performed at [facility], [address] [place], [place] [address]     RADIOLOGY:  Nm Myocar Multi W/spect W/wall Motion / Ef  Result Date: 06/28/2019  T wave inversion was noted during  stress in the II, III, aVF, V5, V6 and I leads.  The study is normal.  This is a low risk study.  The left ventricular ejection fraction is normal (55-65%).     Follow up with PCP in 1 week.  Management plans discussed with the patient, family and they are in agreement.  CODE STATUS:  Code Status History    Date Active Date Inactive Code Status Order ID Comments User Context   06/24/2019 0414 06/26/2019 1851 Full Code [id number]  Lance Coon, MD Inpatient   Advance Care Planning Activity      TOTAL TIME TAKING CARE OF THIS PATIENT ON DAY OF DISCHARGE: more than 30 minutes.   Neita Carp M.D on 06/29/2019 at 4:47 PM  Between 7am to 6pm - Pager - [phone]  After 6pm go to [email or web address] - password [email or web address] [facility]  Office  [phone]  CC: Primary care physician; Patient, No Pcp Per  Note: This dictation was prepared with Dragon dictation along with smaller phrase technology. Any transcriptional errors that result from this process are unintentional.

## 2019-06-29 NOTE — Progress Notes (Signed)
Meds stored in pharmacy returned to patient.

## 2019-06-29 NOTE — Discharge Instructions (Signed)
Follow-up with primary care physician in 3 days Follow-up with cardiology Dr. Fletcher Anon in 5 to 6 days.  Please follow-up with the cardiology office for outpatient telemetry monitoring for the next 2 weeks

## 2019-06-29 NOTE — Progress Notes (Signed)
Progress Note  Patient Name: Edwin Green Date of Encounter: 06/29/2019  Primary Cardiologist: [facility], Dr. Fletcher Anon  Subjective   No chest pain, palpitations, or feeling of racing HR.   Did report SOB and "somewhat dizzy" yesterday while doing the stairs, which was reportedly with PT.   During today's exam, expressed severe fear and anxiety regarding returning to work.  Inpatient Medications    Scheduled Meds: . enoxaparin (LOVENOX) injection  40 mg Subcutaneous Q24H  . sodium chloride flush  3 mL Intravenous Q12H  . verapamil  120 mg Oral Q12H   Continuous Infusions: . sodium chloride 75 mL/hr at 06/29/19 0736   PRN Meds: acetaminophen **OR** acetaminophen, ondansetron **OR** ondansetron (ZOFRAN) IV, oxyCODONE   Vital Signs    Vitals:   06/28/19 1930 06/28/19 2003 06/29/19 0315 06/29/19 0732  BP: (!) 151/101 (!) 135/99 (!) 142/85 (!) 138/93  Pulse: (!) 104 (!) 102 73 69  Resp:      Temp: 98.3 F (36.8 C)  98.1 F (36.7 C) 98 F (36.7 C)  TempSrc: Oral  Oral Oral  SpO2: 100%  100% 100%  Weight:      Height:        Intake/Output Summary (Last 24 hours) at 06/29/2019 0926 Last data filed at 06/29/2019 0735 Gross per 24 hour  Intake 872.57 ml  Output 1575 ml  Net -702.43 ml   Last 3 Weights 06/26/2019 06/26/2019 06/25/2019  Weight (lbs) 193 lb 6.4 oz 180 lb 195 lb 14.4 oz  Weight (kg) 87.726 kg 81.647 kg 88.86 kg      Telemetry    SR, 70s-100s - Personally Reviewed  ECG    No new tracings - Personally Reviewed  Physical Exam   GEN: No acute distress.  Lying in bed Neck: No JVD Cardiac: RRR, double apical impulse /S4, 2.6 systolic LSB murmur, no rubs or gallops.  Respiratory: Clear to auscultation bilaterally. GI: Soft, nontender, non-distended  MS: No edema; No deformity. Neuro:  Nonfocal  Psych: Normal affect   Labs    High Sensitivity Troponin:   Recent Labs  Lab 06/26/19 1918 06/26/19 2203 06/27/19 0200 06/27/19 0507 06/28/19 1310   TROPONINIHS 52* 61* 54* 64* 20*      Cardiac EnzymesNo results for input(s): TROPONINI in the last 168 hours. No results for input(s): TROPIPOC in the last 168 hours.   Chemistry Recent Labs  Lab 06/26/19 1918 06/27/19 0200 06/28/19 0556 06/29/19 0542  NA 138 135 136 136  K 4.0 3.9 4.4 4.4  CL 102 102 106 107  CO2 24 24 23  21*  GLUCOSE 146* 134* 100* 99  BUN 37* 42* 38* 32*  CREATININE 2.67* 2.30* 1.58* 1.39*  CALCIUM 9.1 8.6* 8.6* 8.3*  PROT 7.6  --   --   --   ALBUMIN 4.3  --   --   --   AST 23  --   --   --   ALT 24  --   --   --   ALKPHOS 60  --   --   --   BILITOT 0.6  --   --   --   GFRNONAA 26* 31* 49* 57*  GFRAA 30* 36* 56* >60  ANIONGAP 12 9 7 8      Hematology Recent Labs  Lab 06/26/19 1918 06/27/19 0200 06/28/19 0556  WBC 6.6 8.3 5.0  RBC 4.99 4.75 4.60  HGB 14.7 14.1 13.8  HCT 45.0 42.3 41.9  MCV 90.2 89.1 91.1  MCH  29.5 29.7 30.0  MCHC 32.7 33.3 32.9  RDW 14.1 14.0 13.8  PLT 246 232 224    BNPNo results for input(s): BNP, PROBNP in the last 168 hours.   DDimer No results for input(s): DDIMER in the last 168 hours.   Radiology    Nm Myocar Multi W/spect W/wall Motion / Ef  Result Date: 06/28/2019  T wave inversion was noted during stress in the II, III, aVF, V5, V6 and I leads.  The study is normal.  This is a low risk study.  The left ventricular ejection fraction is normal (55-65%).     Cardiac Studies   TTE 06/27/2019  1. The left ventricle has normal systolic function with an ejection fraction of 60-65%. The cavity size was normal. There is severe concentric left ventricular hypertrophy with near cavity obliteration in systole. Left ventricular diastolic Doppler  parameters are consistent with impaired relaxation.  2. The right ventricle has normal systolic function. The cavity was normal. There is no increase in right ventricular wall thickness.Unable to estimate RVSP  3. There is dilatation of the aortic root 4.0 cm . ascending  aorta not well visualized  Patient Profile     58 y.o. male with prolonged history of uncontrolled hypertension, recent syncope, and who is being evaluated for recent syncopal episode in the setting of underlying hypertrophic cardiomyopathy after discharge on new medications including lisinopril, hydrochlorothiazide, and amlodipine.  Assessment & Plan    Syncope --No pre-syncope or syncope since admission. --Suspect syncopal episode occurred in the setting of dehydration with underlying hypertrophic cardiomyopathy based on cardiac workup including physical exam, EKG, and echo. Labs at admission showed AKI, consistent with volume depletion. --As below, vasodilators and diuretics contraindicated in the setting of HOCM and discontinued this admission. --No arrhythmia seen on telemetry this admission. Regardless, recommend discharge with live Zio monitor for 14 days of live monitoring as cannot completely rule out syncope 2/2 arrhythmia.  --Will arrange for live Zio placement before discharge and follow-up in the office.   --He will need to be monitored after discharge with follow-up with EP scheduled as needed.  --Continue oral verapamil as below with uptitration as needed for optimal HR/BP control. Recommend strict HR control in the setting of HOCM.  --Given tachycardic rates overnight and room in BP, consider further up-titration of verapamil.  --Recommend ambulate patient before discharge and monitor vitals / HR during ambulation.   Hypertrophic cardiomyopathy --Earlier felt SOB with stairs. --As above. Suspect etiology of syncope due to underlying HOCM  --Echo above consistent with hypertrophic cardiomyopathy. --Strict HR and BP control. Avoid dehydration. Consider up-titration of verapamil. --Continue to monitor with echo, follow-up in the office.  HTN --Suboptimal BP control. Currently 138/93. History of uncontrolled BP, severe LVH on EKG. --Given HOCM, discontinued diuretic and  vasodilators as above. --Continue oral verapamil for strict BP/ HR control.  --Follow-up in the office   Mildly elevated troponin without chest pain --No reported chest pain --Likely supply demand ischemia in the setting of the above --Lexiscan ruled low risk study. Will schedule for follow-up as an outpatient in the office.  For questions or updates, please contact [facility] Please consult [email or web address] for contact info under        Signed, Lennon AlstromJacquelyn D Tacy Chavis, PA-C  06/29/2019, 9:26 AM

## 2019-06-29 NOTE — Progress Notes (Signed)
Discharged to home via [facility].  His prescription is printed.  He has follow up appoints with [facility] clinic and Dr. Fletcher Anon.

## 2019-06-29 NOTE — Telephone Encounter (Signed)
Went to the floor and placed a ZIO AT monitor on the patient.  ID #: [id number]

## 2019-07-09 ENCOUNTER — Ambulatory Visit: Payer: No Typology Code available for payment source | Admitting: Physician Assistant

## 2019-07-13 ENCOUNTER — Telehealth: Payer: Self-pay | Admitting: Nurse Practitioner

## 2019-07-13 NOTE — Telephone Encounter (Signed)

## 2019-07-14 ENCOUNTER — Ambulatory Visit: Payer: No Typology Code available for payment source | Admitting: Nurse Practitioner

## 2019-07-19 ENCOUNTER — Encounter: Payer: Self-pay | Admitting: Physician Assistant

## 2019-07-19 NOTE — Progress Notes (Deleted)
Cardiology Office Note    Date:  07/19/2019   ID:  Edwin Green, DOB 09-08-1961, MRN [id number]  PCP:  Patient, No Pcp Per  Cardiologist:  Kathlyn Sacramento, MD  Electrophysiologist:  None   Chief Complaint: Hospital follow-up  History of Present Illness:   Edwin Green is a 58 y.o. male with history of recently diagnosed likely hypertrophic cardiomyopathy, syncope, CKD stage II, uncontrolled hypertension, and epistaxis who presents for hospital follow-up after recent admission to [facility] from 7/11 through 7/14 for syncope.  Patient was recently admitted to the hospital in early 06/2019 with hypertensive urgency and epistaxis.  Patient indicated at times had not taken his medication in years with blood pressure in the 200s over 130s.  He was discharged on amlodipine, lisinopril, and HCTZ.  He was discharged on 7/11.  While at the pharmacy to pick up his medications he checked his blood pressure and found it to be 676 systolic.  He felt unusual.  He stood up to get a drink of water and while standing at the checkout he had a syncopal episode and was brought back to [facility].  Echo on 06/27/2019 showed an EF of 60 to 65%, normal LV cavity size, severe concentric LVH with near cavity obliteration in systole, diastolic dysfunction, normal RV systolic function, normal RV cavity size, dilated aortic root measuring 4 cm, no significant valvular abnormality.  Lexiscan Myoview was low risk study.  It was felt that the patient's syncopal episode may have been secondary to volume depletion in the setting of previously undiagnosed hypertrophic cardiomyopathy with renal function indicating AKI.  Outpatient cardiac monitoring was recommended and is pending at this time.  It was recommended to avoid diuretics and vasodilators for hypertension.  In this setting, patient was transitioned to verapamil with recommendation to avoid further diuretics and hydralazine.  ***  Labs: 06/2019 - potassium 4.4, serum creatinine  1.39, Hgb 13.9, PLT 224, albumin 4.3, AST/ALT normal, magnesium 2.5 01/2018 - total cholesterol 171, tragus rate 43, HDL 66, LDL 96, TSH normal  Past Medical History:  Diagnosis Date  . CKD (chronic kidney disease), stage II   . Epistaxis   . Hypertension   . Hypertrophic cardiomyopathy ([place])    a. TTE 7/20 - 60 to 65%, normal LV cavity size, severe concentric LVH with near cavity obliteration in systole, diastolic dysfunction, normal RV systolic function, normal RV cavity size, dilated aortic root measuring 4 cm, no significant valvular abnormality    Past Surgical History:  Procedure Laterality Date  . NO PAST SURGERIES      Current Medications: No outpatient medications have been marked as taking for the 07/22/19 encounter (Appointment) with [name], PA-C.    Allergies:   Patient has no known allergies.   Social History   Socioeconomic History  . Marital status: Single    Spouse name: Not on file  . Number of children: Not on file  . Years of education: Not on file  . Highest education level: Not on file  Occupational History  . Not on file  Social Needs  . Financial resource strain: Not on file  . Food insecurity    Worry: Not on file    Inability: Not on file  . Transportation needs    Medical: Not on file    Non-medical: Not on file  Tobacco Use  . Smoking status: Current Some Day Smoker    Types: Cigarettes  . Smokeless tobacco: Never Used  Substance and Sexual Activity  .  Alcohol use: Yes  . Drug use: No  . Sexual activity: Not on file  Lifestyle  . Physical activity    Days per week: Not on file    Minutes per session: Not on file  . Stress: Not on file  Relationships  . Social Musicianconnections    Talks on phone: Not on file    Gets together: Not on file    Attends religious service: Not on file    Active member of club or organization: Not on file    Attends meetings of clubs or organizations: Not on file    Relationship status: Not on file  Other  Topics Concern  . Not on file  Social History Narrative  . Not on file     Family History:  The patient's family history includes Asthma in his son; Hypertension in his father and mother; Stroke in his father.  ROS:   ROS   EKGs/Labs/Other Studies Reviewed:    Studies reviewed were summarized above. The additional studies were reviewed today:  2D Echo 06/2019: 1. The left ventricle has normal systolic function with an ejection fraction of 60-65%. The cavity size was normal. There is severe concentric left ventricular hypertrophy with near cavity obliteration in systole. Left ventricular diastolic Doppler parameters are consistent with impaired relaxation.  2. The right ventricle has normal systolic function. The cavity was normal. There is no increase in right ventricular wall thickness.Unable to estimate RVSP  3. There is dilatation of the aortic root 4.0 cm . ascending aorta not well visualized. __________  Luci BankZio pending  EKG:  EKG is ordered today.  The EKG ordered today demonstrates ***  Recent Labs: 06/26/2019: ALT 24; Magnesium 2.5 06/28/2019: Hemoglobin 13.8; Platelets 224 06/29/2019: BUN 32; Creatinine, Ser 1.39; Potassium 4.4; Sodium 136  Recent Lipid Panel    Component Value Date/Time   CHOL 171 02/04/2018 1034   TRIG 43 02/04/2018 1034   HDL 66 02/04/2018 1034   CHOLHDL 2.6 02/04/2018 1034   LDLCALC 96 02/04/2018 1034    PHYSICAL EXAM:    VS:  There were no vitals taken for this visit.  BMI: There is no height or weight on file to calculate BMI.  Physical Exam  Wt Readings from Last 3 Encounters:  06/26/19 193 lb 6.4 oz (87.7 kg)  06/25/19 195 lb 14.4 oz (88.9 kg)  04/07/18 195 lb (88.5 kg)     ASSESSMENT & PLAN:   1. ***  Disposition: F/u with Dr. Kirke CorinArida in *** and EP in ***.    Medication Adjustments/Labs and Tests Ordered: Current medicines are reviewed at length with the patient today.  Concerns regarding medicines are outlined above. Medication  changes, Labs and Tests ordered today are summarized above and listed in the Patient Instructions accessible in Encounters.   Signed, Eula Listenyan Kaylaann Mountz, PA-C 07/19/2019 2:44 PM     [facility] [address] [place], [place] [address] [phone]

## 2019-07-22 ENCOUNTER — Telehealth: Payer: Self-pay

## 2019-07-22 ENCOUNTER — Ambulatory Visit: Payer: Self-pay | Admitting: Physician Assistant

## 2019-07-22 NOTE — Telephone Encounter (Signed)
Recieved request from :ssa ° °Forwarded to [employer] for processing ° °

## 2019-07-28 ENCOUNTER — Telehealth: Payer: Self-pay | Admitting: Pharmacy Technician

## 2019-07-28 NOTE — Telephone Encounter (Signed)
Patient failed to provide2020 financial documentation. No additional medication assistance will be provided by [facility] without the required proof of income documentation. Patient notified by letter.  Goebel Hellums, CPhT [facility] 

## 2019-08-02 NOTE — Progress Notes (Addendum)
Cardiology Office Note    Date:  08/05/2019   ID:  Edwin Green, DOB 08/18/1961, MRN [id number]  PCP:  Patient, No Pcp Per  Cardiologist:  Lorine BearsMuhammad Arida, MD  Electrophysiologist:  None   Chief Complaint: Hospital follow up  History of Present Illness:   Edwin Green is a 58 y.o. male with history of ecently diagnosed likely hypertrophic cardiomyopathy, syncope, CKD stage II, uncontrolled hypertension, and epistaxis who presents for hospital follow-up after recent admission to [facility] from 7/11 through 7/14 for syncope.  Patient was recently admitted to the hospital in early 06/2019 with hypertensive urgency and epistaxis.  Patient indicated at times had not taken his medication in years with blood pressure in the 200s over 130s.  He was discharged on amlodipine, lisinopril, and HCTZ.  He was discharged on 7/11.  While at the pharmacy to pick up his medications he checked his blood pressure and found it to be 118 systolic.  He felt unusual.  He stood up to get a drink of water and while standing at the checkout he had a syncopal episode and was brought back to [facility].  Echo on 06/27/2019 showed an EF of 60 to 65%, normal LV cavity size, severe concentric LVH with near cavity obliteration in systole, diastolic dysfunction, normal RV systolic function, normal RV cavity size, dilated aortic root measuring 4 cm, no significant valvular abnormality.  Lexiscan Myoview was low risk study.  It was felt that the patient's syncopal episode may have been secondary to volume depletion in the setting of previously undiagnosed hypertrophic cardiomyopathy with renal function indicating AKI.  Outpatient cardiac monitoring was recommended and is pending at this time.  It was recommended to avoid diuretics and vasodilators for hypertension.  In this setting, patient was transitioned to verapamil with recommendation to avoid further diuretics and hydralazine.  Patient comes in feeling well today.  He denies any  further syncopal episodes.  No dizziness or presyncope either.  No chest pain, shortness of breath, epistaxis, palpitations, lower extremity swelling, abdominal distention, orthopnea, PND, early satiety.  He reports compliance with verapamil.  He does not check his blood pressure at home.  He has concerns regarding continuing his current 2 jobs in the setting of his recent syncopal episode, elevated blood pressure, and potential hypertrophic cardiomyopathy diagnosis.  Indicates he has been working with the [employer] and has applied for disability.  He was active as a child and denies ever having any history of exertional chest pain, dizziness, presyncope, or syncope as a child.  There is no family history of sudden cardiac death.  Currently feels well.  Labs: 06/2019 - potassium 4.4, serum creatinine 1.39, Hgb 13.9, PLT 224, albumin 4.3, AST/ALT normal, magnesium 2.5 01/2018 - total cholesterol 171, triglycerides 43, HDL 66, LDL 96, TSH normal   Orthostatic vital signs: Lying: 193/116, 60 bpm Sitting: 180/101, 62 bpm Standing: 129/138, 67 bpm Standing x3 minutes: 182/143, 69 bpm  Past Medical History:  Diagnosis Date   CKD (chronic kidney disease), stage II    Epistaxis    Hypertension    Hypertrophic cardiomyopathy (HCC)    a. TTE 7/20 - 60 to 65%, normal LV cavity size, severe concentric LVH with near cavity obliteration in systole, diastolic dysfunction, normal RV systolic function, normal RV cavity size, dilated aortic root measuring 4 cm, no significant valvular abnormality    Past Surgical History:  Procedure Laterality Date   NO PAST SURGERIES      Current Medications: Current Meds  Medication Sig   verapamil (CALAN) 120 MG tablet Take 1 tablet (120 mg total) by mouth every 12 (twelve) hours.    Allergies:   Patient has no known allergies.   Social History   Socioeconomic History   Marital status: Single    Spouse name: Not on file   Number of  children: Not on file   Years of education: Not on file   Highest education level: Not on file  Occupational History   Not on file  Social Needs   Financial resource strain: Not on file   Food insecurity    Worry: Not on file    Inability: Not on file   Transportation needs    Medical: Not on file    Non-medical: Not on file  Tobacco Use   Smoking status: Current Some Day Smoker    Types: Cigarettes   Smokeless tobacco: Never Used  Substance and Sexual Activity   Alcohol use: Yes   Drug use: No   Sexual activity: Not on file  Lifestyle   Physical activity    Days per week: Not on file    Minutes per session: Not on file   Stress: Not on file  Relationships   Social connections    Talks on phone: Not on file    Gets together: Not on file    Attends religious service: Not on file    Active member of club or organization: Not on file    Attends meetings of clubs or organizations: Not on file    Relationship status: Not on file  Other Topics Concern   Not on file  Social History Narrative   Not on file     Family History:  The patient's family history includes Asthma in his son; Hypertension in his father and mother; Stroke in his father.  ROS:   Review of Systems  Constitutional: Positive for malaise/fatigue. Negative for chills, diaphoresis, fever and weight loss.  HENT: Negative for congestion and nosebleeds.   Eyes: Negative for discharge and redness.  Respiratory: Negative for cough, hemoptysis, sputum production, shortness of breath and wheezing.   Cardiovascular: Negative for chest pain, palpitations, orthopnea, claudication, leg swelling and PND.  Gastrointestinal: Negative for abdominal pain, blood in stool, heartburn, melena, nausea and vomiting.  Genitourinary: Negative for hematuria.  Musculoskeletal: Negative for falls and myalgias.  Skin: Negative for rash.  Neurological: Negative for dizziness, tingling, tremors, sensory change, speech  change, focal weakness, loss of consciousness, weakness and headaches.  Endo/Heme/Allergies: Does not bruise/bleed easily.  Psychiatric/Behavioral: Negative for substance abuse. The patient is not nervous/anxious.   All other systems reviewed and are negative.    EKGs/Labs/Other Studies Reviewed:    Studies reviewed were summarized above. The additional studies were reviewed today:  2D Echo 06/2019: 1. The left ventricle has normal systolic function with an ejection fraction of 60-65%. The cavity size was normal. There is severe concentric left ventricular hypertrophy with near cavity obliteration in systole. Left ventricular diastolic Doppler parameters are consistent with impaired relaxation.  2. The right ventricle has normal systolic function. The cavity was normal. There is no increase in right ventricular wall thickness.Unable to estimate RVSP  3. There is dilatation of the aortic root 4.0 cm . ascending aorta not well visualized. __________  Luci BankZio pending   EKG:  EKG is ordered today.  The EKG ordered today demonstrates NSR, 64 bpm, possible left atrial enlargement, early repolarization, poor R wave progression along the precordial leads, lateral T wave  inversion, grossly unchanged from prior  Recent Labs: 06/26/2019: ALT 24; Magnesium 2.5 06/28/2019: Hemoglobin 13.8; Platelets 224 06/29/2019: BUN 32; Creatinine, Ser 1.39; Potassium 4.4; Sodium 136  Recent Lipid Panel    Component Value Date/Time   CHOL 171 02/04/2018 1034   TRIG 43 02/04/2018 1034   HDL 66 02/04/2018 1034   CHOLHDL 2.6 02/04/2018 1034   LDLCALC 96 02/04/2018 1034    PHYSICAL EXAM:    VS:  BP (!) 207/139 (BP Location: Right Arm, Patient Position: Sitting, Cuff Size: Normal)    Pulse 64    Ht 6\' 1"  (1.854 m)    Wt 207 lb (93.9 kg)    BMI 27.31 kg/m   BMI: Body mass index is 27.31 kg/m.  Physical Exam  Constitutional: He is oriented to person, place, and time. He appears well-developed and well-nourished.    HENT:  Head: Normocephalic and atraumatic.  Eyes: Right eye exhibits no discharge. Left eye exhibits no discharge.  Neck: Normal range of motion. No JVD present.  Cardiovascular: Normal rate, regular rhythm, S1 normal, S2 normal and normal heart sounds. Exam reveals no distant heart sounds, no friction rub, no midsystolic click and no opening snap.  No murmur heard. Pulmonary/Chest: Effort normal and breath sounds normal. No respiratory distress. He has no decreased breath sounds. He has no wheezes. He has no rales. He exhibits no tenderness.  Abdominal: Soft. He exhibits no distension. There is no abdominal tenderness.  Musculoskeletal:        General: No edema.  Neurological: He is alert and oriented to person, place, and time.  Skin: Skin is warm and dry. No cyanosis. Nails show no clubbing.  Psychiatric: He has a normal mood and affect. His speech is normal and behavior is normal. Judgment and thought content normal.    Wt Readings from Last 3 Encounters:  08/05/19 207 lb (93.9 kg)  06/26/19 193 lb 6.4 oz (87.7 kg)  06/25/19 195 lb 14.4 oz (88.9 kg)     ASSESSMENT & PLAN:   1. Syncope: Felt to be in the setting of possible hypertrophic cardiomyopathy with dehydration.  Outpatient ZIO monitoring remains pending as below to evaluate for significant arrhythmia with preliminary review showing sinus tachycardia with no significant arrhythmia.  Await final read.  Orthostatic vital signs as above.  Per [employer] law, patient is to refrain from driving for minimum of 6 months.  2. Suspected hypertrophic cardiomyopathy: Echo during admission in 06/2019 showed hyperdynamic LV systolic function with severe LV hypertrophy more in the septum and the posterior wall with near cavity obliteration in systole with mild LVOT gradient at rest.  Outpatient ZIO monitoring remains pending at this time to evaluate for significant arrhythmia.  Schedule cardiac MRI.  Refer to EP.  Increase verapamil to 180  mg daily.  Given heart rate of 64 bpm we may not be able to escalate this much further.  Given patient's syncopal episode, significantly elevated blood pressure that remains poorly controlled, and possible hypertrophic cardiomyopathy I recommend the patient remain out of work at least until his blood pressure is consistently less than 629 systolic and his potential hypertrophic cardiomyopathy is further evaluated by his primary cardiologist and electrophysiology.  3. Hypertension: Blood pressure is poorly controlled today at 189/122.  Documented discharge blood pressure in hospital of 138/93.  Titrate verapamil to 180 mg daily as outlined above.  Add losartan 25 mg daily.  Check BMP in 1 week.  4. Elevated troponin: Felt to be supply demand ischemia  in the setting of the above.  Lexiscan Myoview during admission was low risk.  Aggressive risk factor modification.  5. CKD stage II: Likely in the setting of poorly controlled hypertension.  Could consider referral to nephrology if PCP felt indicated.  Add losartan as above with follow-up BMP in 1 week.  Disposition: F/u with Dr. Kirke CorinArida in 2 weeks.  Patient has been referred to EP as outlined above.   Medication Adjustments/Labs and Tests Ordered: Current medicines are reviewed at length with the patient today.  Concerns regarding medicines are outlined above. Medication changes, Labs and Tests ordered today are summarized above and listed in the Patient Instructions accessible in Encounters.   Signed, Eula Listenyan Sunnie Odden, PA-C 08/05/2019 11:28 AM     [facility] [place] [address] [place], [place] [address] [phone]   Addendum 08/15/2019:  Verapamil was to be 180 mg bid.

## 2019-08-05 ENCOUNTER — Ambulatory Visit (INDEPENDENT_AMBULATORY_CARE_PROVIDER_SITE_OTHER): Payer: Self-pay | Admitting: Physician Assistant

## 2019-08-05 ENCOUNTER — Encounter: Payer: Self-pay | Admitting: Physician Assistant

## 2019-08-05 ENCOUNTER — Other Ambulatory Visit: Payer: Self-pay

## 2019-08-05 ENCOUNTER — Encounter (INDEPENDENT_AMBULATORY_CARE_PROVIDER_SITE_OTHER): Payer: Self-pay

## 2019-08-05 VITALS — BP 207/139 | HR 64 | Ht 73.0 in | Wt 207.0 lb

## 2019-08-05 DIAGNOSIS — R778 Other specified abnormalities of plasma proteins: Secondary | ICD-10-CM

## 2019-08-05 DIAGNOSIS — R55 Syncope and collapse: Secondary | ICD-10-CM

## 2019-08-05 DIAGNOSIS — R7989 Other specified abnormal findings of blood chemistry: Secondary | ICD-10-CM

## 2019-08-05 DIAGNOSIS — I422 Other hypertrophic cardiomyopathy: Secondary | ICD-10-CM

## 2019-08-05 DIAGNOSIS — I10 Essential (primary) hypertension: Secondary | ICD-10-CM

## 2019-08-05 DIAGNOSIS — N182 Chronic kidney disease, stage 2 (mild): Secondary | ICD-10-CM

## 2019-08-05 MED ORDER — LOSARTAN POTASSIUM 25 MG PO TABS: 25.0000 mg | ORAL_TABLET | Freq: Every day | ORAL | 3 refills | Status: DC

## 2019-08-05 MED ORDER — VERAPAMIL HCL 120 MG PO TABS: 180.0000 mg | ORAL_TABLET | Freq: Two times a day (BID) | ORAL | 3 refills | Status: DC

## 2019-08-05 NOTE — Patient Instructions (Signed)
Medication Instructions:  1- INCREASE Verapamil Take 1.5 tablets (180 mg total) by mouth every 12 (twelve) hours. 2- START Losartan Take 1 tablet (25 mg total) by mouth daily.  If you need a refill on your cardiac medications before your next appointment, please call your pharmacy.   Lab work: Your physician recommends that you return for lab work in: 1 week at the medical mall. (BMET) No appt is needed. Hours are M-F 7AM- 6 PM.  If you have labs (blood work) drawn today and your tests are completely normal, you will receive your results only by: Marland Kitchen MyChart Message (if you have MyChart) OR . A paper copy in the mail If you have any lab test that is abnormal or we need to change your treatment, we will call you to review the results.  Testing/Procedures: 1- Cardiac MRI You are scheduled for Cardiac MRI on TBD. Please arrive atTBD ______ (30-45 minutes prior to test start time). ?  ?  Magnetic resonance imaging (MRI) is a painless test that produces images of the inside of the body without using X-rays. During an MRI, strong magnets and radio waves work together in a [employer], political party to form detailed images. MRI images may provide more details about a medical condition than X-rays, CT scans, and ultrasounds can provide.  You may be given earphones to listen for instructions.  You may eat a light breakfast and take medications as ordered. If a contrast material will be used, an IV will be inserted into one of your veins. Contrast material will be injected into your IV.  You will be asked to remove all metal, including: Watch, jewelry, and other metal objects including hearing aids, hair pieces and dentures. (Braces and fillings normally are not a problem.)  If contrast material was used:  It will leave your body through your urine within a day. You may be told to drink plenty of fluids to help flush the contrast material out of your system.  TEST WILL TAKE APPROXIMATELY 1 HOUR  PLEASE NOTIFY  SCHEDULING AT LEAST 24 HOURS IN ADVANCE IF YOU ARE UNABLE TO KEEP YOUR APPOINTMENT. [phone]    Follow-Up: At [facility], you and your health needs are our priority.  As part of our continuing mission to provide you with exceptional heart care, we have created designated Provider Care Teams.  These Care Teams include your primary Cardiologist (physician) and Advanced Practice Providers (APPs -  Physician Assistants and Nurse Practitioners) who all work together to provide you with the care you need, when you need it. . You will need a follow up appointment in 2 weeks. You may see Kathlyn Sacramento, MD or Christell Faith, PA-C.   Any Other Special Instructions Will Be Listed Below (If Applicable). Referral to EP with Dr. Caryl Comes

## 2019-08-11 ENCOUNTER — Telehealth: Payer: Self-pay | Admitting: Physician Assistant

## 2019-08-11 NOTE — Telephone Encounter (Signed)
Returned call to Pharmacy to confirm dose of verapamil since they do not have hx of this patient and previous verapamil dose.   Per tech note from [employer], [place] does not reflect what is prescribed.   Pt was on 1 tablet verapamil (120 mg total) BID. Pt to increase to 1.5 tablets (180 mg total) BID.   Pt has not picked up new script and is currently taking 1 tablet (120 mg total) BID. He will pick up new prescription later in the week.   Routing message to [employer], PA to confirm 180 mg total BID.

## 2019-08-11 NOTE — Telephone Encounter (Signed)
Please call to discuss directions for Verapamil.

## 2019-08-15 NOTE — Telephone Encounter (Signed)
Note addendum complete.  Thanks.

## 2019-08-16 NOTE — Progress Notes (Deleted)
Cardiology Office Note    Date:  08/16/2019   ID:  Edwin Green, DOB 02/05/1961, MRN [id number]  PCP:  Patient, No Pcp Per  Cardiologist:  Kathlyn Sacramento, MD  Electrophysiologist:  None   Chief Complaint: Follow up  History of Present Illness:   Edwin Green is a 58 y.o. male with history of recently diagnosed likely hypertrophic cardiomyopathy, syncope, CKD stage II, uncontrolled hypertension, and epistaxis who presents for follow up of ***  ***hospital follow-up after recent admission to [facility] from 7/11 through 7/14 for syncope.  Patient was recently admitted to the hospital in early 06/2019 with hypertensive urgency and epistaxis.  Patient indicated at times had not taken his medication in years with blood pressure in the 200s over 130s.  He was discharged on amlodipine, lisinopril, and HCTZ.  He was discharged on 7/11.  While at the pharmacy to pick up his medications he checked his blood pressure and found it to be 962 systolic.  He felt unusual.  He stood up to get a drink of water and while standing at the checkout he had a syncopal episode and was brought back to [facility].  Echo on 06/27/2019 showed an EF of 60 to 65%, normal LV cavity size, severe concentric LVH with near cavity obliteration in systole, diastolic dysfunction, normal RV systolic function, normal RV cavity size, dilated aortic root measuring 4 cm, no significant valvular abnormality.  Lexiscan Myoview was low risk study.  It was felt that the patient's syncopal episode may have been secondary to volume depletion in the setting of previously undiagnosed hypertrophic cardiomyopathy with renal function indicating AKI.  Outpatient cardiac monitoring was recommended and is pending at this time.  It was recommended to avoid diuretics and vasodilators for hypertension.  In this setting, patient was transitioned to verapamil with recommendation to avoid further diuretics and hydralazine.  He was seen in follow up on 08/05/2019 and  was feeling well.  He denied any further syncopal episodes.  He was not checking his BP at home with an office BP of 207/139.  Cardiac MRI was ordered and is scheduled for 09/02/2019.  He was referred to EP.  His verapamil was increased to 180 mg bid and losartan 25 mg daily was added.  Follow up BMET was advised given the addition of ARB, though not completed.   ***   Labs: 06/2019 - potassium 4.4, serum creatinine 1.39, Hgb 13.9, PLT 224, albumin 4.3, AST/ALT normal, magnesium 2.5 01/2018 - total cholesterol 171, triglycerides 43, HDL 66, LDL 96, TSH normal   Orthostatic vital signs: Lying: 193/116, 60 bpm Sitting: 180/101, 62 bpm Standing: 129/138, 67 bpm Standing x3 minutes: 182/143, 69  Past Medical History:  Diagnosis Date   CKD (chronic kidney disease), stage II    Epistaxis    Hypertension    Hypertrophic cardiomyopathy ([place])    a. TTE 7/20 - 60 to 65%, normal LV cavity size, severe concentric LVH with near cavity obliteration in systole, diastolic dysfunction, normal RV systolic function, normal RV cavity size, dilated aortic root measuring 4 cm, no significant valvular abnormality    Past Surgical History:  Procedure Laterality Date   NO PAST SURGERIES      Current Medications: No outpatient medications have been marked as taking for the 08/20/19 encounter (Appointment) with [name], PA-C.    Allergies:   Patient has no known allergies.   Social History   Socioeconomic History   Marital status: Single    Spouse name: Not  on file   Number of children: Not on file   Years of education: Not on file   Highest education level: Not on file  Occupational History   Not on file  Social Needs   Financial resource strain: Not on file   Food insecurity    Worry: Not on file    Inability: Not on file   Transportation needs    Medical: Not on file    Non-medical: Not on file  Tobacco Use   Smoking status: Current Some Day Smoker    Types:  Cigarettes   Smokeless tobacco: Never Used  Substance and Sexual Activity   Alcohol use: Yes   Drug use: No   Sexual activity: Not on file  Lifestyle   Physical activity    Days per week: Not on file    Minutes per session: Not on file   Stress: Not on file  Relationships   Social connections    Talks on phone: Not on file    Gets together: Not on file    Attends religious service: Not on file    Active member of club or organization: Not on file    Attends meetings of clubs or organizations: Not on file    Relationship status: Not on file  Other Topics Concern   Not on file  Social History Narrative   Not on file     Family History:  The patient's family history includes Asthma in his son; Hypertension in his father and mother; Stroke in his father.  ROS:   ROS   EKGs/Labs/Other Studies Reviewed:    Studies reviewed were summarized above. The additional studies were reviewed today:  2D Echo 06/2019: 1. The left ventricle has normal systolic function with an ejection fraction of 60-65%. The cavity size was normal. There is severe concentric left ventricular hypertrophy with near cavity obliteration in systole. Left ventricular diastolic Doppler  parameters are consistent with impaired relaxation.  2. The right ventricle has normal systolic function. The cavity was normal. There is no increase in right ventricular wall thickness.Unable to estimate RVSP  3. There is dilatation of the aortic root 4.0 cm . ascending aorta not well visualized __________  Luci Bank 06/2019: *** __________  Myoview 06/2019:  T wave inversion was noted during stress in the II, III, aVF, V5, V6 and I leads.  The study is normal.  This is a low risk study.  The left ventricular ejection fraction is normal (55-65%).   EKG:  EKG is ordered today.  The EKG ordered today demonstrates ***  Recent Labs: 06/26/2019: ALT 24; Magnesium 2.5 06/28/2019: Hemoglobin 13.8; Platelets  224 06/29/2019: BUN 32; Creatinine, Ser 1.39; Potassium 4.4; Sodium 136  Recent Lipid Panel    Component Value Date/Time   CHOL 171 02/04/2018 1034   TRIG 43 02/04/2018 1034   HDL 66 02/04/2018 1034   CHOLHDL 2.6 02/04/2018 1034   LDLCALC 96 02/04/2018 1034    PHYSICAL EXAM:    VS:  There were no vitals taken for this visit.  BMI: There is no height or weight on file to calculate BMI.  Physical Exam  Wt Readings from Last 3 Encounters:  08/05/19 207 lb (93.9 kg)  06/26/19 193 lb 6.4 oz (87.7 kg)  06/25/19 195 lb 14.4 oz (88.9 kg)     ASSESSMENT & PLAN:   1. ***  Disposition: F/u with Dr. Kirke Corin in ***.   Medication Adjustments/Labs and Tests Ordered: Current medicines are reviewed at length with  the patient today.  Concerns regarding medicines are outlined above. Medication changes, Labs and Tests ordered today are summarized above and listed in the Patient Instructions accessible in Encounters.   Signed, Eula Listenyan Nashay Brickley, PA-C 08/16/2019 2:12 PM     [facility] [address] [place], [place] [address] [phone]

## 2019-08-20 ENCOUNTER — Ambulatory Visit: Payer: No Typology Code available for payment source | Admitting: Physician Assistant

## 2019-08-25 ENCOUNTER — Telehealth: Payer: Self-pay | Admitting: *Deleted

## 2019-08-25 ENCOUNTER — Encounter: Payer: Self-pay | Admitting: *Deleted

## 2019-08-25 NOTE — Telephone Encounter (Signed)
Called to speak with patient regarding cardiac MRI---voice mail is full----will mail letter to patient with appointment date and time --09/02/19 at 1[facility]00pm [facility]--arrive time 12:15pm

## 2019-08-31 ENCOUNTER — Telehealth (HOSPITAL_COMMUNITY): Payer: Self-pay | Admitting: Emergency Medicine

## 2019-08-31 NOTE — Telephone Encounter (Signed)
Reaching out to patient to offer assistance regarding upcoming cardiac imaging study; pt verbalizes understanding of appt date/time, parking situation and where to check in, and verified current allergies; name and call back number provided for further questions should they arise Edwin Bond RN Navigator Cardiac Imaging [facility] [phone] office [phone] cell  Side note: pt verbalized that he is stressed d/t financial constraints related medical bills; I also provided patient with financial/billing phone number for resources

## 2019-09-02 ENCOUNTER — Ambulatory Visit (HOSPITAL_COMMUNITY): Admission: RE | Admit: 2019-09-02 | Payer: No Typology Code available for payment source | Source: Ambulatory Visit

## 2019-09-23 ENCOUNTER — Ambulatory Visit: Payer: Self-pay | Admitting: Internal Medicine

## 2019-09-24 ENCOUNTER — Telehealth: Payer: Self-pay | Admitting: Physician Assistant

## 2019-09-24 NOTE — Telephone Encounter (Signed)
Fax notification received from [employer] stating the patient's ZIO AT monitor had not been received.  This was placed on 06/29/2019 at the hospital prior to discharge.  I called and spoke with the patient to make sure he did not still have this in his possession.  He states he brought this back to our office at his last appointment ~ 1 month ago and gave it to the nurse to mail back for him.  I advised I would follow up with the nurse working with Christell Faith, PA at his visit on 08/05/19 to see if she recalls sending this monitor out.   The patient voices understanding and was agreeable.

## 2019-09-27 NOTE — Telephone Encounter (Signed)
Call to patient to get more information about zio AT monitor. He reports that he brought it into the office when he came for appt and nurse mailed it. He can not remember her name.   I was the nurse for Thurmond Butts that day and I also talked with CMA that was assisting. Neither of [place] remember sending a montior back for this patient.   I made call to zio to see if they could get final report without device, they said they cannot. I asked about any additional cost that would encur since device was not returned. She said pt would be responsible for cost.   I will reach out to [facility], zio rep to see if there is any other information she can provide.   Per Ailene Ravel, zio rep, no further cost at this time for lost monitor.   I printed what we had in zio suite to scan into pt chart and will forward to provider.   Advised pt to call for any further questions or concerns.

## 2019-09-28 ENCOUNTER — Encounter: Payer: Self-pay | Admitting: Physician Assistant

## 2019-09-28 ENCOUNTER — Telehealth: Payer: Self-pay

## 2019-09-28 NOTE — Telephone Encounter (Signed)
Call to zio after receiving request from Christell Faith, PA to order new monitor.  Zio suggested ordering a whole new monitor with website.   Call attempted to patient to make him aware. No answer, no voicemail available.

## 2019-09-29 NOTE — Telephone Encounter (Signed)
Attempted to call patient. NA/NV 09/29/2019

## 2019-09-30 NOTE — Telephone Encounter (Signed)
Attempted to call patient. NA/NV 10/15.  Letter sent to patient.

## 2019-11-03 NOTE — Progress Notes (Deleted)
ELECTROPHYSIOLOGY CONSULT NOTE  Patient ID: Edwin Green, MRN: [id number], DOB/AGE: [birth year] 56 y.o. Admit date: (Not on file) Date of Consult: 11/03/2019  Primary Physician: Patient, No Pcp Per Primary Cardiologist: ***     Edwin Green is a 58 y.o. male who is being seen today for the evaluation of *** at the request of ***.    HPI Edwin Green is a 58 y.o. male ***   DATE TEST EF   7/20 Echo   60-65 % Cavity obliteration  IVS 20/19 mm   7/20 MYOVIEW    % Nonischemic        Echo on 06/27/2019 showed an EF of 60 to 65%, normal LV cavity size, severe concentric LVH with near cavity obliteration in systole, diastolic dysfunction, normal RV systolic function, normal RV cavity size, dilated aortic root measuring 4 cm, no significant valvular abnormality. Lexiscan Myoview was low risk study    Past Medical History:  Diagnosis Date  . CKD (chronic kidney disease), stage II   . Epistaxis   . Hypertension   . Hypertrophic cardiomyopathy ([place])    a. TTE 7/20 - 60 to 65%, normal LV cavity size, severe concentric LVH with near cavity obliteration in systole, diastolic dysfunction, normal RV systolic function, normal RV cavity size, dilated aortic root measuring 4 cm, no significant valvular abnormality      Surgical History:  Past Surgical History:  Procedure Laterality Date  . NO PAST SURGERIES       Home Meds: No outpatient medications have been marked as taking for the 11/04/19 encounter (Appointment) with Deboraha Sprang, MD.    Allergies: No Known Allergies  Social History   Socioeconomic History  . Marital status: Single    Spouse name: Not on file  . Number of children: Not on file  . Years of education: Not on file  . Highest education level: Not on file  Occupational History  . Not on file  Social Needs  . Financial resource strain: Not on file  . Food insecurity    Worry: Not on file    Inability: Not on file  . Transportation needs   Medical: Not on file    Non-medical: Not on file  Tobacco Use  . Smoking status: Current Some Day Smoker    Types: Cigarettes  . Smokeless tobacco: Never Used  Substance and Sexual Activity  . Alcohol use: Yes  . Drug use: No  . Sexual activity: Not on file  Lifestyle  . Physical activity    Days per week: Not on file    Minutes per session: Not on file  . Stress: Not on file  Relationships  . Social Herbalist on phone: Not on file    Gets together: Not on file    Attends religious service: Not on file    Active member of club or organization: Not on file    Attends meetings of clubs or organizations: Not on file    Relationship status: Not on file  . Intimate partner violence    Fear of current or ex partner: Not on file    Emotionally abused: Not on file    Physically abused: Not on file    Forced sexual activity: Not on file  Other Topics Concern  . Not on file  Social History Narrative  . Not on file     Family History  Problem Relation Age of Onset  . Hypertension Mother   .  Stroke Father   . Hypertension Father   . Asthma Son      ROS:  Please see the history of present illness.   {ros master:[id number]}  All other systems reviewed and negative.    Physical Exam:*** There were no vitals taken for this visit. General: Well developed, well nourished male in no acute distress. Head: Normocephalic, atraumatic, sclera non-icteric, no xanthomas, nares are without discharge. EENT: normal  Lymph Nodes:  none Neck: Negative for carotid bruits. JVD not elevated. Back:without scoliosis kyphosis*** Lungs: Clear bilaterally to auscultation without wheezes, rales, or rhonchi. Breathing is unlabored. Heart: RRR with S1 S2. No *** ***/6 systolic*** murmur . No rubs, or gallops appreciated. Abdomen: Soft, non-tender, non-distended with normoactive bowel sounds. No hepatomegaly. No rebound/guarding. No obvious abdominal masses. Msk:  Strength and tone appear normal  for age. Extremities: No clubbing or cyanosis. No*** ***+*** edema.  Distal pedal pulses are 2+ and equal bilaterally. Skin: Warm and Dry Neuro: Alert and oriented X 3. CN III-XII intact Grossly normal sensory and motor function . Psych:  Responds to questions appropriately with a normal affect.      Labs: Cardiac Enzymes No results for input(s): CKTOTAL, CKMB, TROPONINI in the last 72 hours. CBC Lab Results  Component Value Date   WBC 5.0 06/28/2019   HGB 13.8 06/28/2019   HCT 41.9 06/28/2019   MCV 91.1 06/28/2019   PLT 224 06/28/2019   PROTIME: No results for input(s): LABPROT, INR in the last 72 hours. Chemistry No results for input(s): NA, K, CL, CO2, BUN, CREATININE, CALCIUM, PROT, BILITOT, ALKPHOS, ALT, AST, GLUCOSE in the last 168 hours.  Invalid input(s): LABALBU Lipids Lab Results  Component Value Date   CHOL 171 02/04/2018   HDL 66 02/04/2018   LDLCALC 96 02/04/2018   TRIG 43 02/04/2018   BNP No results found for: PROBNP Thyroid Function Tests: No results for input(s): TSH, T4TOTAL, T3FREE, THYROIDAB in the last 72 hours.  Invalid input(s): FREET3 Miscellaneous No results found for: DDIMER  Radiology/Studies:  No results found.  EKG: *** 7/20 sinus rhythm with modest T wave inversions 1L, V4 - V 6 8/20 sinus rhythm with deep T wave inversions 1, L V4-V6  Assessment and Plan: *** Sherryl Manges

## 2019-11-04 ENCOUNTER — Ambulatory Visit: Payer: Self-pay | Admitting: Internal Medicine

## 2019-11-04 ENCOUNTER — Encounter

## 2019-11-10 ENCOUNTER — Telehealth: Payer: Self-pay | Admitting: Pharmacy Technician

## 2019-11-10 NOTE — Telephone Encounter (Signed)
Patient failed to provide 2020 poi.  No additional medication assistance will be provided by [facility] without the required proof of income documentation.  Patient notified by letter.  Edwin Green J. Mervin Ramires Care Manager [facility] 

## 2020-01-24 ENCOUNTER — Encounter: Payer: Self-pay | Admitting: *Deleted

## 2020-01-24 ENCOUNTER — Telehealth: Payer: Self-pay | Admitting: *Deleted

## 2020-01-24 NOTE — Telephone Encounter (Signed)
Spoke with patient regarding cardiacmri appointment scheduled 02/16/20 at 11:00 am at [facility]---arrival time is 10:15 am 1st floor admissions office----will mail information to patient

## 2020-02-01 NOTE — Telephone Encounter (Signed)
Returned call to [employer] to get more information on zio device.   Per tech, [place], multiple attempts have been made to reach patient to confirm current insurance plan prior to mailing device. They have not been able to get in touch with patient.   I see that insurance information has been added to chart and I gave them this information. She agreed to attempt to run this information and continue trying to get in touch with patient.   Per chart review, we have had a difficult time trying to reach patient as well.

## 2020-02-01 NOTE — Telephone Encounter (Signed)
[employer] calling  States that monitor is on has been placed on hold Has been unable to reach patient to clarify payment information due to no insurance  Will continue to attempt contact

## 2020-02-15 ENCOUNTER — Telehealth (HOSPITAL_COMMUNITY): Payer: Self-pay | Admitting: Emergency Medicine

## 2020-02-15 NOTE — Telephone Encounter (Signed)
VM box full, cannot leave new message

## 2020-02-16 ENCOUNTER — Inpatient Hospital Stay (HOSPITAL_COMMUNITY): Admission: RE | Admit: 2020-02-16 | Payer: No Typology Code available for payment source | Source: Ambulatory Visit

## 2023-02-17 ENCOUNTER — Inpatient Hospital Stay
Admission: EM | Admit: 2023-02-17 | Discharge: 2023-02-21 | DRG: 305 | Disposition: A | Discharge status: Home / Self Care | Payer: 59 | Attending: Internal Medicine | Admitting: Internal Medicine

## 2023-02-17 ENCOUNTER — Other Ambulatory Visit: Payer: Self-pay

## 2023-02-17 ENCOUNTER — Encounter: Payer: Self-pay | Admitting: Family Medicine

## 2023-02-17 ENCOUNTER — Observation Stay (HOSPITAL_COMMUNITY)
Admit: 2023-02-17 | Discharge: 2023-02-17 | Disposition: A | Discharge status: Home / Self Care | Payer: 59 | Attending: Family Medicine | Admitting: Family Medicine

## 2023-02-17 DIAGNOSIS — Z91148 Patient's other noncompliance with medication regimen for other reason: Secondary | ICD-10-CM

## 2023-02-17 DIAGNOSIS — R55 Syncope and collapse: Secondary | ICD-10-CM | POA: Diagnosis not present

## 2023-02-17 DIAGNOSIS — R040 Epistaxis: Principal | ICD-10-CM | POA: Diagnosis present

## 2023-02-17 DIAGNOSIS — F1010 Alcohol abuse, uncomplicated: Secondary | ICD-10-CM | POA: Diagnosis not present

## 2023-02-17 DIAGNOSIS — N1832 Chronic kidney disease, stage 3b: Secondary | ICD-10-CM | POA: Diagnosis present

## 2023-02-17 DIAGNOSIS — I161 Hypertensive emergency: Secondary | ICD-10-CM

## 2023-02-17 DIAGNOSIS — I1A0 Resistant hypertension: Secondary | ICD-10-CM | POA: Diagnosis present

## 2023-02-17 DIAGNOSIS — I421 Obstructive hypertrophic cardiomyopathy: Secondary | ICD-10-CM | POA: Diagnosis not present

## 2023-02-17 DIAGNOSIS — N179 Acute kidney failure, unspecified: Secondary | ICD-10-CM | POA: Diagnosis not present

## 2023-02-17 DIAGNOSIS — F17210 Nicotine dependence, cigarettes, uncomplicated: Secondary | ICD-10-CM | POA: Diagnosis present

## 2023-02-17 DIAGNOSIS — I160 Hypertensive urgency: Principal | ICD-10-CM | POA: Diagnosis present

## 2023-02-17 DIAGNOSIS — D62 Acute posthemorrhagic anemia: Secondary | ICD-10-CM | POA: Diagnosis not present

## 2023-02-17 DIAGNOSIS — I129 Hypertensive chronic kidney disease with stage 1 through stage 4 chronic kidney disease, or unspecified chronic kidney disease: Secondary | ICD-10-CM | POA: Diagnosis present

## 2023-02-17 DIAGNOSIS — Z8249 Family history of ischemic heart disease and other diseases of the circulatory system: Secondary | ICD-10-CM

## 2023-02-17 DIAGNOSIS — I77810 Thoracic aortic ectasia: Secondary | ICD-10-CM | POA: Diagnosis present

## 2023-02-17 DIAGNOSIS — Z91199 Patient's noncompliance with other medical treatment and regimen due to unspecified reason: Secondary | ICD-10-CM

## 2023-02-17 DIAGNOSIS — I517 Cardiomegaly: Secondary | ICD-10-CM | POA: Diagnosis not present

## 2023-02-17 DIAGNOSIS — Z1152 Encounter for screening for COVID-19: Secondary | ICD-10-CM

## 2023-02-17 DIAGNOSIS — I10 Essential (primary) hypertension: Secondary | ICD-10-CM | POA: Diagnosis not present

## 2023-02-17 DIAGNOSIS — Z789 Other specified health status: Secondary | ICD-10-CM | POA: Diagnosis present

## 2023-02-17 DIAGNOSIS — Z716 Tobacco abuse counseling: Secondary | ICD-10-CM

## 2023-02-17 DIAGNOSIS — I422 Other hypertrophic cardiomyopathy: Secondary | ICD-10-CM

## 2023-02-17 DIAGNOSIS — Z79899 Other long term (current) drug therapy: Secondary | ICD-10-CM

## 2023-02-17 DIAGNOSIS — E860 Dehydration: Secondary | ICD-10-CM | POA: Diagnosis present

## 2023-02-17 LAB — CBC WITH DIFFERENTIAL/PLATELET
Abs Immature Granulocytes: 0.01 10*3/uL (ref 0.00–0.07)
Basophils Absolute: 0.1 10*3/uL (ref 0.0–0.1)
Basophils Relative: 1 %
Eosinophils Absolute: 0.2 10*3/uL (ref 0.0–0.5)
Eosinophils Relative: 4 %
HCT: 39.2 % (ref 39.0–52.0)
Hemoglobin: 12.6 g/dL — ABNORMAL LOW (ref 13.0–17.0)
Immature Granulocytes: 0 %
Lymphocytes Relative: 35 %
Lymphs Abs: 1.5 10*3/uL (ref 0.7–4.0)
MCH: 29.9 pg (ref 26.0–34.0)
MCHC: 32.1 g/dL (ref 30.0–36.0)
MCV: 93.1 fL (ref 80.0–100.0)
Monocytes Absolute: 0.5 10*3/uL (ref 0.1–1.0)
Monocytes Relative: 11 %
Neutro Abs: 2 10*3/uL (ref 1.7–7.7)
Neutrophils Relative %: 49 %
Platelets: 176 10*3/uL (ref 150–400)
RBC: 4.21 MIL/uL — ABNORMAL LOW (ref 4.22–5.81)
RDW: 13.5 % (ref 11.5–15.5)
WBC: 4.1 10*3/uL (ref 4.0–10.5)
nRBC: 0 % (ref 0.0–0.2)

## 2023-02-17 LAB — HIV ANTIBODY (ROUTINE TESTING W REFLEX): HIV Screen 4th Generation wRfx: NONREACTIVE

## 2023-02-17 LAB — COMPREHENSIVE METABOLIC PANEL
ALT: 30 U/L (ref 0–44)
AST: 29 U/L (ref 15–41)
Albumin: 3.8 g/dL (ref 3.5–5.0)
Alkaline Phosphatase: 61 U/L (ref 38–126)
Anion gap: 6 (ref 5–15)
BUN: 30 mg/dL — ABNORMAL HIGH (ref 6–20)
CO2: 22 mmol/L (ref 22–32)
Calcium: 8.1 mg/dL — ABNORMAL LOW (ref 8.9–10.3)
Chloride: 109 mmol/L (ref 98–111)
Creatinine, Ser: 1.87 mg/dL — ABNORMAL HIGH (ref 0.61–1.24)
GFR, Estimated: 41 mL/min — ABNORMAL LOW (ref >60)
Glucose, Bld: 117 mg/dL — ABNORMAL HIGH (ref 70–99)
Potassium: 4 mmol/L (ref 3.5–5.1)
Sodium: 137 mmol/L (ref 135–145)
Total Bilirubin: 0.6 mg/dL (ref 0.3–1.2)
Total Protein: 7.1 g/dL (ref 6.5–8.1)

## 2023-02-17 LAB — GLUCOSE, CAPILLARY: Glucose-Capillary: 113 mg/dL — ABNORMAL HIGH (ref 70–99)

## 2023-02-17 LAB — URINE DRUG SCREEN, QUALITATIVE (ARMC ONLY)
Amphetamines, Ur Screen: NOT DETECTED
Barbiturates, Ur Screen: NOT DETECTED
Benzodiazepine, Ur Scrn: NOT DETECTED
Cannabinoid 50 Ng, Ur ~~LOC~~: POSITIVE — AB
Cocaine Metabolite,Ur ~~LOC~~: NOT DETECTED
MDMA (Ecstasy)Ur Screen: NOT DETECTED
Methadone Scn, Ur: NOT DETECTED
Opiate, Ur Screen: NOT DETECTED
Phencyclidine (PCP) Ur S: NOT DETECTED
Tricyclic, Ur Screen: NOT DETECTED

## 2023-02-17 LAB — ETHANOL: Alcohol, Ethyl (B): <10 mg/dL (ref <10)

## 2023-02-17 LAB — TYPE AND SCREEN
ABO/RH(D): B POS
Antibody Screen: NEGATIVE

## 2023-02-17 LAB — ECHOCARDIOGRAM COMPLETE
AR max vel: 3.01 cm2
AV Area VTI: 2.84 cm2
AV Area mean vel: 3.05 cm2
AV Mean grad: 4 mmHg
AV Peak grad: 6 mmHg
Ao pk vel: 1.22 m/s
Area-P 1/2: 3.92 cm2
MV VTI: 2.83 cm2
S' Lateral: 3.2 cm

## 2023-02-17 LAB — CREATININE, URINE, RANDOM: Creatinine, Urine: 77 mg/dL

## 2023-02-17 LAB — SODIUM, URINE, RANDOM: Sodium, Ur: 119 mmol/L

## 2023-02-17 LAB — FOLATE: Folate: 7.5 ng/mL (ref >5.9)

## 2023-02-17 MED ORDER — VERAPAMIL HCL 120 MG PO TABS
120.0000 mg | ORAL_TABLET | Freq: Two times a day (BID) | ORAL | Status: DC
  Administered 2023-02-17: 120 mg via ORAL
  Filled 2023-02-17: qty 1

## 2023-02-17 MED ORDER — LABETALOL HCL 5 MG/ML IV SOLN
10.0000 mg | INTRAVENOUS | Status: DC | PRN
  Administered 2023-02-17 (×2): 10 mg via INTRAVENOUS
  Filled 2023-02-17 (×2): qty 4

## 2023-02-17 MED ORDER — VERAPAMIL HCL 120 MG PO TABS
180.0000 mg | ORAL_TABLET | Freq: Two times a day (BID) | ORAL | Status: DC
  Filled 2023-02-17: qty 1.5

## 2023-02-17 MED ORDER — LIDOCAINE VISCOUS HCL 2 % MT SOLN
15.0000 mL | Freq: Once | OROMUCOSAL | Status: AC
  Administered 2023-02-17: 15 mL via OROMUCOSAL
  Filled 2023-02-17: qty 15

## 2023-02-17 MED ORDER — CHLORHEXIDINE GLUCONATE CLOTH 2 % EX PADS
6.0000 | MEDICATED_PAD | Freq: Every day | CUTANEOUS | Status: DC
  Administered 2023-02-17 – 2023-02-21 (×7): 6 via TOPICAL

## 2023-02-17 MED ORDER — LABETALOL HCL 5 MG/ML IV SOLN
10.0000 mg | Freq: Once | INTRAVENOUS | Status: AC
  Administered 2023-02-17: 10 mg via INTRAVENOUS
  Filled 2023-02-17: qty 4

## 2023-02-17 MED ORDER — LABETALOL HCL 5 MG/ML IV SOLN
20.0000 mg | Freq: Two times a day (BID) | INTRAVENOUS | Status: DC | PRN
  Filled 2023-02-17 (×2): qty 4

## 2023-02-17 MED ORDER — ONDANSETRON HCL 4 MG/2ML IJ SOLN: 4.0000 mg | Freq: Four times a day (QID) | INTRAMUSCULAR | Status: DC | PRN

## 2023-02-17 MED ORDER — ONDANSETRON HCL 4 MG/2ML IJ SOLN: 4.0000 mg | Freq: Once | INTRAMUSCULAR | Status: AC

## 2023-02-17 MED ORDER — ONDANSETRON HCL 4 MG/2ML IJ SOLN
INTRAMUSCULAR | Status: AC
  Administered 2023-02-17: 4 mg via INTRAVENOUS
  Filled 2023-02-17: qty 2

## 2023-02-17 MED ORDER — OXYMETAZOLINE HCL 0.05 % NA SOLN
1.0000 | Freq: Once | NASAL | Status: AC
  Administered 2023-02-17: 1 via NASAL
  Filled 2023-02-17: qty 30

## 2023-02-17 MED ORDER — DOXAZOSIN MESYLATE 4 MG PO TABS
8.0000 mg | ORAL_TABLET | Freq: Two times a day (BID) | ORAL | Status: DC
  Administered 2023-02-17: 8 mg via ORAL
  Filled 2023-02-17: qty 1
  Filled 2023-02-17: qty 2

## 2023-02-17 MED ORDER — ONDANSETRON HCL 4 MG PO TABS: 4.0000 mg | ORAL_TABLET | Freq: Four times a day (QID) | ORAL | Status: DC | PRN

## 2023-02-17 MED ORDER — AMLODIPINE BESYLATE 10 MG PO TABS
10.0000 mg | ORAL_TABLET | Freq: Every day | ORAL | Status: DC
  Administered 2023-02-18 – 2023-02-21 (×4): 10 mg via ORAL
  Filled 2023-02-17 (×4): qty 1

## 2023-02-17 MED ORDER — TRANEXAMIC ACID FOR EPISTAXIS
500.0000 mg | Freq: Once | TOPICAL | Status: AC
  Administered 2023-02-17: 500 mg via TOPICAL
  Filled 2023-02-17: qty 10

## 2023-02-17 MED ORDER — LABETALOL HCL 5 MG/ML IV SOLN
20.0000 mg | INTRAVENOUS | Status: DC | PRN
  Administered 2023-02-17: 20 mg via INTRAVENOUS
  Filled 2023-02-17: qty 4

## 2023-02-17 MED ORDER — LACTATED RINGERS IV BOLUS: 1000.0000 mL | Freq: Once | INTRAVENOUS | Status: DC

## 2023-02-17 MED ORDER — CARVEDILOL 12.5 MG PO TABS
12.5000 mg | ORAL_TABLET | Freq: Two times a day (BID) | ORAL | Status: DC
  Administered 2023-02-17 – 2023-02-18 (×2): 12.5 mg via ORAL
  Filled 2023-02-17 (×2): qty 1

## 2023-02-17 MED ORDER — ACETAMINOPHEN 325 MG PO TABS
650.0000 mg | ORAL_TABLET | Freq: Four times a day (QID) | ORAL | Status: DC | PRN
  Administered 2023-02-17 – 2023-02-21 (×2): 650 mg via ORAL
  Filled 2023-02-17 (×3): qty 2

## 2023-02-17 MED ORDER — NICOTINE 7 MG/24HR TD PT24
7.0000 mg | MEDICATED_PATCH | Freq: Every day | TRANSDERMAL | Status: DC
  Administered 2023-02-18 – 2023-02-21 (×4): 7 mg via TRANSDERMAL
  Filled 2023-02-17 (×5): qty 1

## 2023-02-17 MED ORDER — SODIUM CHLORIDE 0.9 % IV SOLN: INTRAVENOUS | Status: DC

## 2023-02-17 MED ORDER — ORAL CARE MOUTH RINSE: 15.0000 mL | OROMUCOSAL | Status: DC | PRN

## 2023-02-17 MED ORDER — SODIUM CHLORIDE 0.9 % IV BOLUS
1000.0000 mL | Freq: Once | INTRAVENOUS | Status: AC
  Administered 2023-02-17: 1000 mL via INTRAVENOUS

## 2023-02-17 MED ORDER — LOSARTAN POTASSIUM 50 MG PO TABS
25.0000 mg | ORAL_TABLET | Freq: Every day | ORAL | Status: DC
  Administered 2023-02-17: 25 mg via ORAL
  Filled 2023-02-17: qty 1

## 2023-02-17 NOTE — Assessment & Plan Note (Signed)
Noted severe hypertrophic cardiomyopathy on 2D echo July 2020 Has had no follow-up or on appropriate antihypertensive medication Will repeat 2D echo given syncopal event and accelerated hypertension Will plan to formally consult cardiology Follow-up cardiology recommendations.

## 2023-02-17 NOTE — Assessment & Plan Note (Signed)
Severe epistaxis requiring bilateral nasal packing in setting of uncontrolled hypertension hypertensive urgency Bleeding now stabilized status post bilateral nasal packing Per ER physician, ENT Dr. Kathyrn Sheriff aware of case with plan for formal consultation. Cont nasal packing  Titrate BP  Follow up ENT recs

## 2023-02-17 NOTE — Assessment & Plan Note (Signed)
Creatinine 1.9 on presentation Suspect multifactorial in the setting of poorly controlled hypertension, alcohol abuse and mild dehydration Placed on maintenance IV fluids overnight Check FENa and renal ultrasound Hold nephrotoxic agents Follow

## 2023-02-17 NOTE — ED Provider Notes (Signed)
[facility] Provider Note    Event Date/Time   First MD Initiated Contact with Patient 02/17/23 [phone]     (approximate)   History   Epistaxis   HPI  Edwin Green is a 62 y.o. male past medical history of hypertension poorly controlled noncompliant with medications, hypertrophic cardiomyopathy who presents because of nosebleed.  Patient has had intermittent epistaxis from the right nare over the last several days.  Was previously self-limited until this morning nosebleed has not stopped.  Patient has tried putting some pressure on it.  He does have history of prior epistaxis requiring admission also for hypertensive urgency.  Patient has not taken blood pressure medications in several years.  Has also not seen a doctor in several years.  He denies headache vision change numbness tingling weakness chest pain back pain or dyspnea.  In 2020 patient presented with epistaxis and was found to be quite hypertensive and was admitted.  Initially was on a nicardipine drip and was then started on HCTZ lisinopril and amlodipine.  He had a bounce back for syncope and had echo that showed hypertrophic cardiomyopathy syncope was thought to be due to volume depletion in the setting of unknown hypertrophic cardiomyopathy.  He was seen by cardiology who started him on verapamil, and there was recommendation by cardiology to avoid any vasodilators or diuretics given his hypertrophic cardiomyopathy.  At his outpatient follow-up was persistently hypertensive and losartan was added.  Patient has been noncompliant since this time and has not followed up with cardiology.  There have been plan to have cardiac MRI and to see EP.     Past Medical History:  Diagnosis Date   CKD (chronic kidney disease), stage II    Epistaxis    Hypertension    Hypertrophic cardiomyopathy ([place])    a. TTE 7/20 - 60 to 65%, normal LV cavity size, severe concentric LVH with near cavity obliteration in systole,  diastolic dysfunction, normal RV systolic function, normal RV cavity size, dilated aortic root measuring 4 cm, no significant valvular abnormality    Patient Active Problem List   Diagnosis Date Noted   Hypertrophic cardiomyopathy ([place])    Syncope 06/26/2019   Chest pain 06/26/2019   AKI (acute kidney injury) ([place]) 06/26/2019   HTN (hypertension) 06/24/2019   Nosebleed 06/24/2019   Hypertensive urgency 06/24/2019     Physical Exam  Triage Vital Signs: ED Triage Vitals  Enc Vitals Group     BP 02/17/23 0712 (!) 244/158     Pulse Rate 02/17/23 0708 95     Resp 02/17/23 0708 17     Temp 02/17/23 0708 98.2 F (36.8 C)     Temp Source 02/17/23 0708 Oral     SpO2 02/17/23 0708 96 %     Weight --      Height --      Head Circumference --      Peak Flow --      Pain Score 02/17/23 0709 0     Pain Loc --      Pain Edu? --      Excl. in [place]? --     Most recent vital signs: Vitals:   02/17/23 0830 02/17/23 0849  BP: (!) 225/143 (!) 172/118  Pulse: 77 61  Resp:  20  Temp:    SpO2: 96% 97%     General: Awake, no distress.  CV:  Good peripheral perfusion.  Resp:  Normal effort.  Abd:  No distention.  Neuro:  Awake, Alert, Oriented x 3  Other:  Blood is trickling out the right nare, no clear source of anterior epistaxis but nasal septum is diffusely bloody No active bleeding from the left nare   ED Results / Procedures / Treatments  Labs (all labs ordered are listed, but only abnormal results are displayed) Labs Reviewed  COMPREHENSIVE METABOLIC PANEL - Abnormal; Notable for the following components:      Result Value   Glucose, Bld 117 (*)    BUN 30 (*)    Creatinine, Ser 1.87 (*)    Calcium 8.1 (*)    GFR, Estimated 41 (*)    All other components within normal limits  CBC WITH DIFFERENTIAL/PLATELET - Abnormal; Notable for the following components:   RBC 4.21 (*)    Hemoglobin 12.6 (*)    All other components within normal limits  TYPE AND SCREEN      EKG   Reviewed interpreted by myself shows sinus rhythm with a left axis deviation LVH and T wave inversions V4 through V6 1 aVL 2 aVF  RADIOLOGY    PROCEDURES:  Critical Care performed: Yes, see critical care procedure note(s)  .Critical Care  Performed by: Rada Hay, MD Authorized by: Rada Hay, MD   Critical care provider statement:    Critical care time (minutes):  30   Critical care was time spent personally by me on the following activities:  Development of treatment plan with patient or surrogate, discussions with consultants, evaluation of patient's response to treatment, examination of patient, ordering and review of laboratory studies, ordering and review of radiographic studies, ordering and performing treatments and interventions, pulse oximetry, re-evaluation of patient's condition and review of old charts .Epistaxis Management  Date/Time: 02/17/2023 8:54 AM  Performed by: Rada Hay, MD Authorized by: Rada Hay, MD   Consent:    Consent obtained:  Verbal   Risks discussed:  Pain, nasal injury and bleeding   Alternatives discussed:  Alternative treatment Universal protocol:    Patient identity confirmed:  Verbally with patient Anesthesia:    Anesthesia method:  Topical application   Topical anesthetic:  Lidocaine gel Procedure details:    Treatment site:  R anterior   Treatment method:  Nasal balloon   Treatment episode: recurring   Post-procedure details:    Assessment:  Bleeding decreased   Procedure completion:  Tolerated .Epistaxis Management  Date/Time: 02/17/2023 8:54 AM  Performed by: Rada Hay, MD Authorized by: Rada Hay, MD   Consent:    Consent obtained:  Verbal   Risks discussed:  Bleeding, nasal injury and pain Universal protocol:    Patient identity confirmed:  Verbally with patient Anesthesia:    Anesthesia method:  Topical application   Topical anesthetic:  Lidocaine  gel Procedure details:    Treatment site:  L anterior   Treatment method:  Nasal balloon   Treatment complexity:  Limited   Treatment episode: recurring   Post-procedure details:    Assessment:  Bleeding stopped   Procedure completion:  Tolerated with difficulty Comments:     Patient had vagal syncope in response to 2nd packing   The patient is on the cardiac monitor to evaluate for evidence of arrhythmia and/or significant heart rate changes.   MEDICATIONS ORDERED IN ED: Medications  tranexamic acid (CYKLOKAPRON) 1000 MG/10ML topical solution 500 mg (has no administration in time range)  lidocaine (XYLOCAINE) 2 % viscous mouth solution 15 mL (has no administration in time range)  oxymetazoline (AFRIN)  0.05 % nasal spray 1 spray (1 spray Each Nare Given 02/17/23 0750)  labetalol (NORMODYNE) injection 10 mg (10 mg Intravenous Given 02/17/23 0749)  ondansetron (ZOFRAN) injection 4 mg (4 mg Intravenous Given 02/17/23 0845)  sodium chloride 0.9 % bolus 1,000 mL (1,000 mLs Intravenous New Bag/Given 02/17/23 0845)     IMPRESSION / MDM / ASSESSMENT AND PLAN / ED COURSE  I reviewed the triage vital signs and the nursing notes.                              Patient's presentation is most consistent with acute presentation with potential threat to life or bodily function.  Differential diagnosis includes, but is not limited to, intra epistaxis, posterior epistaxis, hypertensive urgency/emergency, acute renal failure, medication noncompliance  Patient is a 62 year old male with past medical history of hypertrophic cardiomyopathy and poorly controlled hypertension who presents today for epistaxis.  Has had some intermittent bleeding from the right nare over the last several days and more persistent bleeding from the right naris since this morning.  On arrival he is significantly hypertensive initial blood pressure 244/160.  His only symptom is the epistaxis he has no symptoms of endorgan damage  including no vision change headache altered mental status chest pain dyspnea.  On exam he does have active bleeding from the right nare and is spitting up clots of blood.  No bleeding from the left side.  I placed a nasal clamp and then inserted TXA and Afrin soaked gauze, with plan to proceed to nasal packing if this is unsuccessful at controlling the bleeding.  Check labs including renal function given patient's uncontrolled hypertensive and and no recent lab check.  Patient's creatinine is around where has been in the past 1.9.  Hemoglobin is 12.6 from 13.83 years ago.  I reviewed cardiology note from 4 years ago and patient was seen after syncopal episode and found to have hypertrophic cardiomyopathy.  At that time there is plan for him to have cardiac MRI and to see EP.  This time there was recommendation to keep the patient out of work until his blood pressure was consistently less than 160.  He was started on verapamil and losartan.  There was recommendation to avoid diuretics or vasodilators.  I suspect that patient's blood pressure is chronically very poorly controlled.  The stress and discomfort of the epistaxis also likely causing more elevation.  Will see how his blood pressure settles out once the hemorrhage is controlled and plan to start him on p.o. medications.  Will need close follow-up with cardiology.  Patient continued to have epistaxis through TXA soaked gauze and then placed an anterior pack.  Despite inflation about 20 cc into the wound continued to bleed to the anterior packing so I then placed a subsequent left-sided anterior packing and had good hemostasis.  However when I was in the room patient started to complain of feeling dizzy got very diaphoretic and had a syncopal episode.  He noticed that his heart rate dipped down into the 40s and on repeat blood pressure it is in the 120s suspect he had vagal reaction.  Patient then started to have some nausea and he was placed back upright  and had large amount of hematemesis but I am assuming is swallowed blood.  Given these events I will observe the patient.  I have discussed with Dr. Kathyrn Sheriff with ENT who is aware.  FINAL CLINICAL IMPRESSION(S) / ED DIAGNOSES   Final diagnoses:  None     Rx / DC Orders   ED Discharge Orders     None        Note:  This document was prepared using Dragon voice recognition software and may include unintentional dictation errors.   Rada Hay, MD 02/17/23 [phone]

## 2023-02-17 NOTE — Assessment & Plan Note (Signed)
Noted significant syncopal episode in ER  Suspect secondary to  vasovagal event with bilateral nasal packing for epistaxis and hypertensive urgency Patient now back to baseline Will check serial orthostatics Accelerated hypertension and hypertrophic cardiomyopathy or confounding issues Will plan for formal echocardiogram and cardiology consultation CT head x 1 Follow closely

## 2023-02-17 NOTE — Assessment & Plan Note (Signed)
Patient admits to drinking at least 2 large beers daily No reported hard liquor use Will check alcohol level Check thiamine and folate level CIWA protocol as clinically indicated Follow

## 2023-02-17 NOTE — Progress Notes (Signed)
*  PRELIMINARY RESULTS* Echocardiogram 2D Echocardiogram has been performed.  Edwin Green 02/17/2023, 1:25 PM

## 2023-02-17 NOTE — Consult Note (Signed)
Edwin Green, Edwin Green [id number] [birth year] Deneise Lever, MD  Reason for Consult: Evaluate because of uncontrolled epistaxis.  HPI: Patient is a 62 year old male who has a past history of hypertrophic cardiomyopathy and poorly controlled hypertension who presented to the emergency room this morning with epistaxis.  He had some intermittent bleeding from right naris over the last couple of days and more persisting bleeding of the right nare since this morning.  In the ER he was significantly hypertensive with initial blood pressure of 244/160.  His only symptom was epistaxis from his right nostril.  He has no symptoms of endorgan damage including no vision change, headache, altered mental status, chest pain, or dyspnea.  He had Afrin and TXA placed into his right nostril to try to control his bleeding.  This did not stop it and required then balloon packing in his right nostril.  This slowed down but a second balloon was placed into his left nostril and then all the bleeding was controlled.  He was admitted to the ICU and has been followed today.  He is on oral medications for his blood pressure and has brought it down slightly.  His blood pressure currently was 169/113 but he has no bleeding coming from his nose or down the back of his throat.  Assessment is made of this to create a plan for removing the packs over this week and make sure that the bleeding is well-controlled.  Allergies: No Known Allergies  ROS: Review of systems normal other than 12 systems except per HPI.  PMH:  Past Medical History:  Diagnosis Date   CKD (chronic kidney disease), stage II    Epistaxis    Hypertension    Hypertrophic cardiomyopathy ([place])    a. TTE 7/20 - 60 to 65%, normal LV cavity size, severe concentric LVH with near cavity obliteration in systole, diastolic dysfunction, normal RV systolic function, normal RV cavity size, dilated aortic root measuring 4 cm, no significant valvular abnormality    FH:  Family  History  Problem Relation Age of Onset   Hypertension Mother    Stroke Father    Hypertension Father    Asthma Son     SH:  Social History   Socioeconomic History   Marital status: Single    Spouse name: Not on file   Number of children: Not on file   Years of education: Not on file   Highest education level: Not on file  Occupational History   Not on file  Tobacco Use   Smoking status: Some Days    Types: Cigarettes   Smokeless tobacco: Never  Substance and Sexual Activity   Alcohol use: Yes   Drug use: No   Sexual activity: Not on file  Other Topics Concern   Not on file  Social History Narrative   Not on file   Social Determinants of Health   Financial Resource Strain: Not on file  Food Insecurity: No Food Insecurity (02/17/2023)   Hunger Vital Sign    Worried About Running Out of Food in the Last Year: Never true    Ran Out of Food in the Last Year: Never true  Transportation Needs: No Transportation Needs (02/17/2023)   PRAPARE - Hydrologist (Medical): No    Lack of Transportation (Non-Medical): No  Physical Activity: Not on file  Stress: Not on file  Social Connections: Not on file  Intimate Partner Violence: Not At Risk (02/17/2023)   Humiliation, Afraid, Rape, and Kick  questionnaire    Fear of Current or Ex-Partner: No    Emotionally Abused: No    Physically Abused: No    Sexually Abused: No    PSH:  Past Surgical History:  Procedure Laterality Date   NO PAST SURGERIES      Physical  Exam: Patient is sitting quietly in the bed with his head elevated 30 degrees and resting.  His blood pressure is 169/113.  Pulse is regular around 70 bpm.  His nose shows a balloon pack all the way in on the right side and inflated.  The left side has a balloon pack and one half way and inflated as well.  There is no active bleeding here.  His oropharynx shows a difficult to see posterior pharyngeal wall.  He has Mallampati 4 anatomy of his  throat.  There is no active bleeding that I can see in the back of his throat and there is no blood staining currently in his throat.  His neck is negative for any nodes or masses and there is no bruising or swelling anywhere.   A/P: Patient has had uncontrolled hypertension that is led to now uncontrolled bleeding from right nostril.  His blood pressure needs to be better controlled before packings are removed.  If we can get his blood pressure down over the next day or 2 we can deflate some of the air from the balloons and hopefully get his left balloon out.  I would probably leave the right balloon in for 5 days to allow things to heal some before the packing is removed.  It is imperative that his blood pressure gets better controlled before remove it so that bleeding does not recur again.  I explained this to the patient at bedside and will follow along this week to see how his blood pressure goes when he is a good candidate for decreasing the air from the balloons and potentially removing the bullets.  Right now as long as is not bleeding nothing further needs to be done to his nose currently.   Huey Romans 02/17/2023 6:36 PM

## 2023-02-17 NOTE — Assessment & Plan Note (Addendum)
Accelerated hypertension presentation Baseline uncontrolled HTN  BP initially 250s/150s w/ active epistaxis  BP now trending 160s/110s s/p '10mg'$  IV labetalol  Will continue labetalol for now  Will tentatively add back on verapamil from previous cardiology recommendations 06/2019 pending formal cardiology consult Follow up cardiology recommendations.

## 2023-02-17 NOTE — ED Notes (Signed)
RN attempted to call report to receiving unit. Informed that charge nurse had not assigned a nurse to the patient at this time.

## 2023-02-17 NOTE — ED Triage Notes (Signed)
Pt c/o intermittent nosebleed x3 days. Pt has hx of HTN but is non-compliant with medication. Pt denies other complaints at this time. Pt arrives axox3 and able to maintain airway.

## 2023-02-17 NOTE — ED Notes (Signed)
Pt with 2 episodes of projectile bloody vomit. MD and RN at bedside. MD was performing nasal packing for ongoing epistaxis. Pt given '4mg'$  Zofran IVP and 1 L NS emergently for symptoms. Pt remains axox3 and able to maintain airway independently through episodes of emesis. Pt reports improvement in sx after medication

## 2023-02-17 NOTE — Consult Note (Signed)
Cardiology Consultation   Patient ID: Edwin Green MRN: [id number]; DOB: December 11, 1961  Admit date: 02/17/2023 Date of Consult: 02/17/2023  PCP:  Kathyrn Lass   [facility] Providers Cardiologist:  Kathlyn Sacramento, MD   {  Patient Profile:   Edwin Green is a 62 y.o. male with a hx of CKD stage II, epistaxis, hypertension, hypertrophic cardiomyopathy who is being seen 02/17/2023 for the evaluation of syncope and hypertension at the request of Dr. Ernestina Patches.  History of Present Illness:   Mr. Berretta was seen back in 2020 by cardiology.  She was admitted in July 2020 with hypertensive urgency and epistaxis.  Patient reported noncompliance with medication for years with blood pressures of 200s over 130s.  He was discharged on amlodipine, lisinopril, hydrochlorothiazide.  At discharge patient had a syncopal episode and was brought back to [facility].  Echo 06/27/2019 showed EF of 60 to 65%, severe concentric LVH with near cavity obliteration in systole, diastolic dysfunction, normal RV SF, dilated aortic root measuring 4 cm, no significant valvular abnormality.  Lexiscan Myoview was low risk.  Was felt the patient syncopal episode may have been secondary to volume depletion in the setting of previously undiagnosed hypertrophic cardiomyopathy with renal function indicating AKI.  Outpatient cardiac monitoring was recommended.  Was recommended to avoid diuretics and vasodilators for hypertension.  Patient was last seen in the office in August 2020.  Heart monitor was still pending.  Cardiac MRI was ordered and patient was referred to EP.  Shortness added for better blood pressure control and verapamil was increased.  Patient was subsequently lost to follow-up.  Patient presented to [facility] ED 02/17/2023 for nosebleed for the last 3-4 days. It was fairly persistent. No chest pain, SOB, LLE, orthopnea or pnd. No nausea or vomiting. He denied headache or vision changes. He reported he has not seen a doctor or been  on medications in years. He felt the BP meds were making him pass out.   In the ER blood pressure was 244/158, pulse rate 95 bpm, respiratory rate 17, afebrile, 96% O2.  Labs showed serum creatinine 1.87, BUN 30, blood glucose 117, hemoglobin 12.6.  Patient was given labetalol 10 mg IV for blood pressure with improvement.  He underwent nasal packing and later had syncopal episode in the ER.  Heart rate dipped into the 40s and repeat blood pressure systolics in the [id number].  Suspected vagal syncope.  Patient was admitted for further workup.  Patient reports he didn't have complete LOC in the ER, but says he felt dizzy and lightheaded.   Past Medical History:  Diagnosis Date   CKD (chronic kidney disease), stage II    Epistaxis    Hypertension    Hypertrophic cardiomyopathy ([place])    a. TTE 7/20 - 60 to 65%, normal LV cavity size, severe concentric LVH with near cavity obliteration in systole, diastolic dysfunction, normal RV systolic function, normal RV cavity size, dilated aortic root measuring 4 cm, no significant valvular abnormality    Past Surgical History:  Procedure Laterality Date   NO PAST SURGERIES       Home Medications:  Prior to Admission medications   Medication Sig Start Date End Date Taking? Authorizing Provider  losartan (COZAAR) 25 MG tablet Take 1 tablet (25 mg total) by mouth daily. 08/05/19 11/03/19  [name], PA-C  verapamil (CALAN) 120 MG tablet Take 1.5 tablets (180 mg total) by mouth every 12 (twelve) hours. Patient not taking: Reported on 02/17/2023 08/05/19   Idolina Primer,  Areta Haber, PA-C    Inpatient Medications: Scheduled Meds:  lidocaine  15 mL Mouth/Throat Once   nicotine  7 mg Transdermal Daily   tranexamic acid  500 mg Topical Once   verapamil  120 mg Oral Q12H   Continuous Infusions:  sodium chloride     PRN Meds: labetalol, ondansetron **OR** ondansetron (ZOFRAN) IV  Allergies:   No Known Allergies  Social History:   Social History   Socioeconomic  History   Marital status: Single    Spouse name: Not on file   Number of children: Not on file   Years of education: Not on file   Highest education level: Not on file  Occupational History   Not on file  Tobacco Use   Smoking status: Some Days    Types: Cigarettes   Smokeless tobacco: Never  Substance and Sexual Activity   Alcohol use: Yes   Drug use: No   Sexual activity: Not on file  Other Topics Concern   Not on file  Social History Narrative   Not on file   Social Determinants of Health   Financial Resource Strain: Not on file  Food Insecurity: No Food Insecurity (02/17/2023)   Hunger Vital Sign    Worried About Running Out of Food in the Last Year: Never true    Ran Out of Food in the Last Year: Never true  Transportation Needs: No Transportation Needs (02/17/2023)   PRAPARE - Hydrologist (Medical): No    Lack of Transportation (Non-Medical): No  Physical Activity: Not on file  Stress: Not on file  Social Connections: Not on file  Intimate Partner Violence: Not At Risk (02/17/2023)   Humiliation, Afraid, Rape, and Kick questionnaire    Fear of Current or Ex-Partner: No    Emotionally Abused: No    Physically Abused: No    Sexually Abused: No    Family History:    Family History  Problem Relation Age of Onset   Hypertension Mother    Stroke Father    Hypertension Father    Asthma Son      ROS:  Please see the history of present illness.   All other ROS reviewed and negative.     Physical Exam/Data:   Vitals:   02/17/23 0930 02/17/23 1001 02/17/23 1030 02/17/23 1031  BP: (!) 168/119 (!) 189/119  (!) 165/116  Pulse: (!) 58 65 (!) 58   Resp: 11 20    Temp:      TempSrc:      SpO2: 99% 99% 97%    No intake or output data in the 24 hours ending 02/17/23 1117    08/05/2019   11:16 AM 06/26/2019   11:07 PM 06/26/2019    7:02 PM  Last 3 Weights  Weight (lbs) 207 lb 193 lb 6.4 oz 180 lb  Weight (kg) 93.895 kg 87.726 kg 81.647  kg     There is no height or weight on file to calculate BMI.  General:  Well nourished, well developed, in no acute distress HEENT: normal Neck: no JVD Vascular: No carotid bruits; Distal pulses 2+ bilaterally Cardiac:  normal S1, S2; RRR; no murmur  Lungs:  clear to auscultation bilaterally, no wheezing, rhonchi or rales  Abd: soft, nontender, no hepatomegaly  Ext: no edema Musculoskeletal:  No deformities, BUE and BLE strength normal and equal Skin: warm and dry  Neuro:  CNs 2-12 intact, no focal abnormalities noted Psych:  Normal affect  EKG:  The EKG was personally reviewed and demonstrates:  ordered Telemetry:  Telemetry was personally reviewed and demonstrates:  ordered  Relevant CV Studies:  Echo 06/2019 1. The left ventricle has normal systolic function with an ejection  fraction of 60-65%. The cavity size was normal. There is severe concentric  left ventricular hypertrophy with near cavity obliteration in systole.  Left ventricular diastolic Doppler  parameters are consistent with impaired relaxation.   2. The right ventricle has normal systolic function. The cavity was  normal. There is no increase in right ventricular wall thickness.Unable to  estimate RVSP   3. There is dilatation of the aortic root 4.0 cm . ascending aorta not  well visualized   Myoview Lexiscan 06/2019  Narrative & Impression  T wave inversion was noted during stress in the II, III, aVF, V5, V6 and I leads. The study is normal. This is a low risk study. The left ventricular ejection fraction is normal (55-65%).    Laboratory Data:  High Sensitivity Troponin:  No results for input(s): "TROPONINIHS" in the last 720 hours.   Chemistry Recent Labs  Lab 02/17/23 0716  NA 137  K 4.0  CL 109  CO2 22  GLUCOSE 117*  BUN 30*  CREATININE 1.87*  CALCIUM 8.1*  GFRNONAA 41*  ANIONGAP 6    Recent Labs  Lab 02/17/23 0716  PROT 7.1  ALBUMIN 3.8  AST 29  ALT 30  ALKPHOS 61  BILITOT 0.6    Lipids No results for input(s): "CHOL", "TRIG", "HDL", "LABVLDL", "LDLCALC", "CHOLHDL" in the last 168 hours.  Hematology Recent Labs  Lab 02/17/23 0716  WBC 4.1  RBC 4.21*  HGB 12.6*  HCT 39.2  MCV 93.1  MCH 29.9  MCHC 32.1  RDW 13.5  PLT 176   Thyroid No results for input(s): "TSH", "FREET4" in the last 168 hours.  BNPNo results for input(s): "BNP", "PROBNP" in the last 168 hours.  DDimer No results for input(s): "DDIMER" in the last 168 hours.   Radiology/Studies:  No results found.   Assessment and Plan:   Syncope - syncope (?presyncope) in the setting of epistaxis s/p nasal packing and severe HTN - check orthostatics - check echo - h/o HCM and syncope in 2020 felt to be due to dehydration in the setting of HCM. Heart monitor and cMRI were ordered but not completed. Pt had been referred to EP, but patient was lost to follow-up. - EKG and tele ordered - Hgb stable. AM labs panding  Epistaxis - severe epistaxis s/p nasal packing - ENT consulted  Hypertension - long history of uncontrolled HTN non compliant with medications - on admission 250s/150s s/p IV labetolol - PTA Verapamil '120mg'$  daily and Losartan '25mg'$  daily - Verapamil '120mg'$  BID restarted on admission - Given AKI/CKD hold Losartan - recommend to avoid diuretics and vasodilators given HCM - HR in the 60s, may not be able to titrate verapamil. Can consider CArdura  Hypertrophic cardiomyopathy - found on echo in 2020 in the setting of syncope. Echo showed LVEF 60-65%, severe concentric LVH with near cavity obliteration in systolic, impaired relaxation - heart monitor not completed in 2020 - referred to EP but lost to follow-up - cMRI Was ordered, but not completed - repeat echo ordered - avoid diuretics and vasodilators - will need further work-up as OP  Alcohol use - he drinks 1-2 large beers daily - CIWA per IM  AKI - Scr 1.9 on admission   For questions or updates, please contact  [facility] Please consult [email or web address] for contact info under    Signed, Francy Mcilvaine Ninfa Meeker, PA-C  02/17/2023 11:17 AM

## 2023-02-17 NOTE — H&P (Signed)
History and Physical    Patient: Edwin Green [id number] DOB: July 20, 1961 DOA: 02/17/2023 DOS: the patient was seen and examined on 02/17/2023 PCP: Pcp, No  Patient coming from: Home  Chief Complaint:  Chief Complaint  Patient presents with   Epistaxis   HPI: [name] is a 62 y.o. male with medical history significant of stage II CKD, epistaxis, hypertension, hypertrophic cardiomyopathy presenting with epistaxis, syncope, hypertensive urgency, alcohol use, AKI.  Patient reports sudden onset of nosebleeding over the past 3 to 4 days.  Patient states symptoms have been fairly persistent.  Denies any trauma.  Baseline hypertension that he is not taking medication for.  No fevers or chills.  No nausea or vomiting.  No reported chest pain.  Has had minimal to mild nosebleeds in the past that have been fairly well-controlled.  1/4 pack/day smoker.  Drinks at least 2 large beers daily.  Denies any other illicit drug use.  No fevers or chills.  No abdominal pain.  Patient noted to have been evaluated in 2020 for similar issues including hypertension and epistaxis. Presented to the ER with pressures in the 260s over 150s.  Afebrile.  No hypoxia.  Initially had nasal packing to 1 nare but still persisting symptoms.  Bilateral nasal packing had to be placed with noted subsequent episode of significant syncope/vasovagal event.  Symptoms resolved.  Patient was given IV labetalol 10 mg with pressures improving into the 170s over 100s.  Epistaxis resolved.  Per ER physician, Dr. Darden Dates, case discussed with on-call ENT physician Dr. Kathyrn Sheriff who will formally consult on the patient. Review of Systems: As mentioned in the history of present illness. All other systems reviewed and are negative. Past Medical History:  Diagnosis Date   CKD (chronic kidney disease), stage II    Epistaxis    Hypertension    Hypertrophic cardiomyopathy ([place])    a. TTE 7/20 - 60 to 65%, normal LV cavity size, severe concentric  LVH with near cavity obliteration in systole, diastolic dysfunction, normal RV systolic function, normal RV cavity size, dilated aortic root measuring 4 cm, no significant valvular abnormality   Past Surgical History:  Procedure Laterality Date   NO PAST SURGERIES     Social History:  reports that he has been smoking cigarettes. He has never used smokeless tobacco. He reports current alcohol use. He reports that he does not use drugs.  No Known Allergies  Family History  Problem Relation Age of Onset   Hypertension Mother    Stroke Father    Hypertension Father    Asthma Son     Prior to Admission medications   Medication Sig Start Date End Date Taking? Authorizing Provider  losartan (COZAAR) 25 MG tablet Take 1 tablet (25 mg total) by mouth daily. 08/05/19 11/03/19  [name], PA-C  verapamil (CALAN) 120 MG tablet Take 1.5 tablets (180 mg total) by mouth every 12 (twelve) hours. Patient not taking: Reported on 02/17/2023 08/05/19   [name], PA-C    Physical Exam: Vitals:   02/17/23 0849 02/17/23 0915 02/17/23 0930 02/17/23 1001  BP: (!) 172/118 (!) 188/132 (!) 168/119 (!) 189/119  Pulse: 61 61 (!) 58 65  Resp: '20 17 11 20  '$ Temp:      TempSrc:      SpO2: 97% 99% 99% 99%   Physical Exam Constitutional:      Appearance: He is normal weight.  HENT:     Head: Normocephalic and atraumatic.     Nose:  Comments: Bilateral nasal packing in place Dried blood around the nares bilaterally Eyes:     Pupils: Pupils are equal, round, and reactive to light.  Cardiovascular:     Rate and Rhythm: Normal rate and regular rhythm.     Pulses: Normal pulses.  Pulmonary:     Effort: Pulmonary effort is normal.  Abdominal:     General: Abdomen is flat. Bowel sounds are normal.  Musculoskeletal:        General: Normal range of motion.     Cervical back: Normal range of motion.  Skin:    General: Skin is warm.  Neurological:     General: No focal deficit present.   Psychiatric:        Mood and Affect: Mood normal.     Data Reviewed:  There are no new results to review at this time. Lab Results  Component Value Date   WBC 4.1 02/17/2023   HGB 12.6 (L) 02/17/2023   HCT 39.2 02/17/2023   MCV 93.1 02/17/2023   PLT 176 [id number]   Last metabolic panel Lab Results  Component Value Date   GLUCOSE 117 (H) 02/17/2023   NA 137 02/17/2023   K 4.0 02/17/2023   CL 109 02/17/2023   CO2 22 02/17/2023   BUN 30 (H) 02/17/2023   CREATININE 1.87 (H) 02/17/2023   GFRNONAA 41 (L) 02/17/2023   CALCIUM 8.1 (L) 02/17/2023   PROT 7.1 02/17/2023   ALBUMIN 3.8 02/17/2023   LABGLOB 2.4 02/04/2018   AGRATIO 1.9 02/04/2018   BILITOT 0.6 02/17/2023   ALKPHOS 61 02/17/2023   AST 29 02/17/2023   ALT 30 02/17/2023   ANIONGAP 6 02/17/2023      Assessment and Plan: * Epistaxis Severe epistaxis requiring bilateral nasal packing in setting of uncontrolled hypertension hypertensive urgency Bleeding now stabilized status post bilateral nasal packing Per ER physician, ENT Dr. Kathyrn Sheriff aware of case with plan for formal consultation. Cont nasal packing  Titrate BP  Follow up ENT recs   AKI (acute kidney injury) ([place]) Creatinine 1.9 on presentation Suspect multifactorial in the setting of poorly controlled hypertension, alcohol abuse and mild dehydration Placed on maintenance IV fluids overnight Check FENa and renal ultrasound Hold nephrotoxic agents Follow  Syncope Noted significant syncopal episode in ER  Suspect secondary to  vasovagal event with bilateral nasal packing for epistaxis and hypertensive urgency Patient now back to baseline Will check serial orthostatics Accelerated hypertension and hypertrophic cardiomyopathy or confounding issues Will plan for formal echocardiogram and cardiology consultation CT head x 1 Follow closely   HTN (hypertension) Accelerated hypertension presentation Baseline uncontrolled HTN  BP initially 250s/150s w/  active epistaxis  BP now trending 160s/110s s/p '10mg'$  IV labetalol  Will continue labetalol for now  Will tentatively add back on verapamil from previous cardiology recommendations 06/2019 pending formal cardiology consult Follow up cardiology recommendations.  Hypertrophic cardiomyopathy ([place]) Noted severe hypertrophic cardiomyopathy on 2D echo July 2020 Has had no follow-up or on appropriate antihypertensive medication Will repeat 2D echo given syncopal event and accelerated hypertension Will plan to formally consult cardiology Follow-up cardiology recommendations.  Alcohol use Patient admits to drinking at least 2 large beers daily No reported hard liquor use Will check alcohol level Check thiamine and folate level CIWA protocol as clinically indicated Follow      Advance Care Planning:   Code Status: Full Code   Consults: ENT w/ Dr.juengel. Cardiology w/ Dr. Fletcher Anon.   Family Communication: Son at the bedside.   Severity of  Illness: The appropriate patient status for this patient is OBSERVATION. Observation status is judged to be reasonable and necessary in order to provide the required intensity of service to ensure the patient's safety. The patient's presenting symptoms, physical exam findings, and initial radiographic and laboratory data in the context of their medical condition is felt to place them at decreased risk for further clinical deterioration. Furthermore, it is anticipated that the patient will be medically stable for discharge from the hospital within 2 midnights of admission.   Author: Deneise Lever, MD 02/17/2023 10:12 AM  For on call review [email or web address].

## 2023-02-17 NOTE — Progress Notes (Signed)
Provider notification: Dr. Ernestina Patches  Patient's BP 199/126 on arrival to unit from ED. RN notified provider, Dr. Ernestina Patches would like to give verapamil and see if medication lowers BP.

## 2023-02-17 NOTE — Progress Notes (Addendum)
1417 Patient admitted to ICU via bed from 1C. Alert and awake. Alert and awake. Moving all extremities. Bilateral nasal tampons noted in nares due to uncontrollable nose bleeds. 1500 Eating lunch. MD S.Newton informed of continued high blood pressure-194/128. New oral medication ordered per MD. MD adamant that he did not want any drips or vasodilators given to patient. 95 MD notified that nurse was unable to reach blood pressure limits noted in prn medications.

## 2023-02-18 DIAGNOSIS — I1A0 Resistant hypertension: Secondary | ICD-10-CM | POA: Diagnosis present

## 2023-02-18 DIAGNOSIS — R55 Syncope and collapse: Secondary | ICD-10-CM | POA: Diagnosis not present

## 2023-02-18 DIAGNOSIS — Z79899 Other long term (current) drug therapy: Secondary | ICD-10-CM | POA: Diagnosis not present

## 2023-02-18 DIAGNOSIS — I77810 Thoracic aortic ectasia: Secondary | ICD-10-CM | POA: Diagnosis present

## 2023-02-18 DIAGNOSIS — I421 Obstructive hypertrophic cardiomyopathy: Secondary | ICD-10-CM | POA: Diagnosis not present

## 2023-02-18 DIAGNOSIS — Z1152 Encounter for screening for COVID-19: Secondary | ICD-10-CM | POA: Diagnosis not present

## 2023-02-18 DIAGNOSIS — Z8249 Family history of ischemic heart disease and other diseases of the circulatory system: Secondary | ICD-10-CM | POA: Diagnosis not present

## 2023-02-18 DIAGNOSIS — N183 Chronic kidney disease, stage 3 unspecified: Secondary | ICD-10-CM | POA: Insufficient documentation

## 2023-02-18 DIAGNOSIS — R040 Epistaxis: Secondary | ICD-10-CM | POA: Diagnosis present

## 2023-02-18 DIAGNOSIS — I422 Other hypertrophic cardiomyopathy: Secondary | ICD-10-CM | POA: Diagnosis present

## 2023-02-18 DIAGNOSIS — I517 Cardiomegaly: Secondary | ICD-10-CM | POA: Diagnosis not present

## 2023-02-18 DIAGNOSIS — E860 Dehydration: Secondary | ICD-10-CM | POA: Diagnosis present

## 2023-02-18 DIAGNOSIS — D62 Acute posthemorrhagic anemia: Secondary | ICD-10-CM | POA: Diagnosis not present

## 2023-02-18 DIAGNOSIS — I129 Hypertensive chronic kidney disease with stage 1 through stage 4 chronic kidney disease, or unspecified chronic kidney disease: Secondary | ICD-10-CM | POA: Diagnosis present

## 2023-02-18 DIAGNOSIS — F17210 Nicotine dependence, cigarettes, uncomplicated: Secondary | ICD-10-CM | POA: Diagnosis present

## 2023-02-18 DIAGNOSIS — N1832 Chronic kidney disease, stage 3b: Secondary | ICD-10-CM | POA: Diagnosis present

## 2023-02-18 DIAGNOSIS — I10 Essential (primary) hypertension: Secondary | ICD-10-CM

## 2023-02-18 DIAGNOSIS — N179 Acute kidney failure, unspecified: Secondary | ICD-10-CM | POA: Diagnosis present

## 2023-02-18 DIAGNOSIS — Z789 Other specified health status: Secondary | ICD-10-CM | POA: Diagnosis not present

## 2023-02-18 DIAGNOSIS — Z91199 Patient's noncompliance with other medical treatment and regimen due to unspecified reason: Secondary | ICD-10-CM | POA: Diagnosis not present

## 2023-02-18 DIAGNOSIS — I160 Hypertensive urgency: Secondary | ICD-10-CM | POA: Diagnosis present

## 2023-02-18 DIAGNOSIS — Z91148 Patient's other noncompliance with medication regimen for other reason: Secondary | ICD-10-CM | POA: Diagnosis not present

## 2023-02-18 DIAGNOSIS — F1010 Alcohol abuse, uncomplicated: Secondary | ICD-10-CM | POA: Diagnosis present

## 2023-02-18 LAB — CBC
HCT: 31.1 % — ABNORMAL LOW (ref 39.0–52.0)
Hemoglobin: 10 g/dL — ABNORMAL LOW (ref 13.0–17.0)
MCH: 29.9 pg (ref 26.0–34.0)
MCHC: 32.2 g/dL (ref 30.0–36.0)
MCV: 92.8 fL (ref 80.0–100.0)
Platelets: 119 10*3/uL — ABNORMAL LOW (ref 150–400)
RBC: 3.35 MIL/uL — ABNORMAL LOW (ref 4.22–5.81)
RDW: 13.5 % (ref 11.5–15.5)
WBC: 5 10*3/uL (ref 4.0–10.5)
nRBC: 0 % (ref 0.0–0.2)

## 2023-02-18 LAB — COMPREHENSIVE METABOLIC PANEL
ALT: 22 U/L (ref 0–44)
AST: 19 U/L (ref 15–41)
Albumin: 3.2 g/dL — ABNORMAL LOW (ref 3.5–5.0)
Alkaline Phosphatase: 51 U/L (ref 38–126)
Anion gap: 7 (ref 5–15)
BUN: 25 mg/dL — ABNORMAL HIGH (ref 6–20)
CO2: 22 mmol/L (ref 22–32)
Calcium: 7.7 mg/dL — ABNORMAL LOW (ref 8.9–10.3)
Chloride: 109 mmol/L (ref 98–111)
Creatinine, Ser: 1.84 mg/dL — ABNORMAL HIGH (ref 0.61–1.24)
GFR, Estimated: 42 mL/min — ABNORMAL LOW (ref >60)
Glucose, Bld: 120 mg/dL — ABNORMAL HIGH (ref 70–99)
Potassium: 3.8 mmol/L (ref 3.5–5.1)
Sodium: 138 mmol/L (ref 135–145)
Total Bilirubin: 0.6 mg/dL (ref 0.3–1.2)
Total Protein: 5.9 g/dL — ABNORMAL LOW (ref 6.5–8.1)

## 2023-02-18 MED ORDER — LABETALOL HCL 5 MG/ML IV SOLN: 10.0000 mg | INTRAVENOUS | Status: DC | PRN

## 2023-02-18 MED ORDER — CARVEDILOL 25 MG PO TABS
25.0000 mg | ORAL_TABLET | Freq: Two times a day (BID) | ORAL | Status: DC
  Administered 2023-02-18 – 2023-02-20 (×4): 25 mg via ORAL
  Filled 2023-02-18 (×4): qty 1

## 2023-02-18 MED ORDER — LABETALOL HCL 5 MG/ML IV SOLN
10.0000 mg | INTRAVENOUS | Status: AC | PRN
  Administered 2023-02-19: 10 mg via INTRAVENOUS
  Filled 2023-02-18: qty 4

## 2023-02-18 MED ORDER — CARVEDILOL 12.5 MG PO TABS
12.5000 mg | ORAL_TABLET | Freq: Once | ORAL | Status: AC
  Administered 2023-02-18: 12.5 mg via ORAL
  Filled 2023-02-18: qty 1

## 2023-02-18 MED ORDER — NICARDIPINE HCL IN NACL 20-0.86 MG/200ML-% IV SOLN
2.0000 mg/h | INTRAVENOUS | Status: DC
  Administered 2023-02-18 (×2): 2 mg/h via INTRAVENOUS
  Filled 2023-02-18: qty 200

## 2023-02-18 MED ORDER — MORPHINE SULFATE (PF) 2 MG/ML IV SOLN: 1.0000 mg | Freq: Once | INTRAVENOUS | Status: DC

## 2023-02-18 MED ORDER — DIPHENHYDRAMINE HCL 50 MG/ML IJ SOLN
12.5000 mg | Freq: Once | INTRAMUSCULAR | Status: AC
  Administered 2023-02-18: 12.5 mg via INTRAVENOUS
  Filled 2023-02-18: qty 1

## 2023-02-18 MED ORDER — PROCHLORPERAZINE EDISYLATE 10 MG/2ML IJ SOLN
10.0000 mg | Freq: Once | INTRAMUSCULAR | Status: AC
  Administered 2023-02-18: 10 mg via INTRAVENOUS
  Filled 2023-02-18: qty 2

## 2023-02-18 MED ORDER — MUPIROCIN 2 % EX OINT
1.0000 | TOPICAL_OINTMENT | Freq: Two times a day (BID) | CUTANEOUS | Status: DC
  Filled 2023-02-18: qty 22

## 2023-02-18 MED ORDER — LABETALOL HCL 5 MG/ML IV SOLN
10.0000 mg | Freq: Once | INTRAVENOUS | Status: AC
  Administered 2023-02-18: 10 mg via INTRAVENOUS
  Filled 2023-02-18: qty 4

## 2023-02-18 MED ORDER — LABETALOL HCL 5 MG/ML IV SOLN
10.0000 mg | Freq: Once | INTRAVENOUS | Status: AC
  Administered 2023-02-18: 10 mg via INTRAVENOUS

## 2023-02-18 NOTE — Progress Notes (Signed)
Progress Note   Patient: Edwin Green [id number] DOB: 20-Aug-1961 DOA: 02/17/2023     0 DOS: the patient was seen and examined on 02/18/2023   Brief hospital course: Taken from H&P.   Esequiel Schauer is a 62 y.o. male with medical history significant of stage II CKD, epistaxis, hypertension, hypertrophic cardiomyopathy presenting with epistaxis, syncope, hypertensive urgency, alcohol use, AKI.  Patient reports sudden onset of nosebleeding over the past 3 to 4 days.  Patient was not taking his medications. for. No fevers or chills. No nausea or vomiting. No reported chest pain. Has had minimal to mild nosebleeds in the past that have been fairly well-controlled. 1/4 pack/day smoker. Drinks at least 2 large beers daily. Denies any other illicit drug use.   ED course and data reviewed.  On presentation patient has significantly elevated blood pressure in the 260s over 150s.  Bilateral nares were packed with noted subsequent episode of syncope/vasovagal event.  Symptoms resolved.  ENT was consulted and recommending slowly deflating left balloon once blood pressure has been improved, recommending keeping right balloon for 5 days.  Patient initially received IV labetalol boluses with some transient improvement in blood pressure and subsequent increase.  He was transferred to stepdown on Cardene infusion overnight.  Cardiology was also consulted.  3/5: Blood pressure still elevated at 169/105, remained on Cardene infusion.  UDS positive for cannabinoid. CBC with decrease of hemoglobin to 10 from 12.6 on admission, platelets decreased to 119 from 176.  Creatinine at 1.84, no recent comparison has lost creatinine shown in his chart was 1.39 in 2020. Echo done yesterday with normal EF, no regional wall motion abnormalities.  Severe eccentric left ventricular hypertrophy.  No evidence of LVOT obstruction or SAM.  Grade 1 diastolic dysfunction.  Dilated left atrium, mildly dilated aortic root. Patient has  positive orthostatic vitals so nicardipine infusion was discontinued by cardiology, they are starting low-dose amlodipine and carvedilol at 25 mg twice daily which can be increased to 50 mg twice daily for target resting heart rate of 60 and blood pressure should be less than 140/90.  Patient will need outpatient cardiac MRI for further evaluation of hypertrophic cardiomyopathy versus hypertensive hypertrophy due to his history of prolonged uncontrolled hypertension and not taking any medications.  Cardiology might consider Zio patch on discharge.      Assessment and Plan: * Epistaxis Severe epistaxis requiring bilateral nasal packing in setting of uncontrolled hypertension hypertensive urgency Bleeding now stabilized status post bilateral nasal packing ENT was consulted and they would like to slowly deflate balloon on the left in a day and advising to keep the packing in for 5 days on the right. Cont nasal packing  Titrate BP  Patient need to follow-up with ENT as an outpatient  Syncope Noted significant syncopal episode in ER  Suspect secondary to  vasovagal event with bilateral nasal packing for epistaxis and hypertensive urgency Patient now back to baseline Patient has positive orthostatic vitals, baseline blood pressure significantly elevated.  Echocardiogram with hypertrophic cardiomyopathy which can be due to uncontrolled hypertension.  Cardiology was consulted and they were recommending outpatient MRI evaluation for further characterization. 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 Creatinine 1.87>>1.84 on presentation.  Patient with no follow-up after 2020 when he was presented with concern of AKI.  Most likely have CKD due to uncontrolled hypertension. Cardiology ordered renal artery duplex scan. 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 -Monitor renal  function -Avoid nephrotoxins   Hypertrophic  cardiomyopathy ([place]) Noted severe hypertrophic cardiomyopathy on 2D echo July 2020 Has had no follow-up or on appropriate antihypertensive medication Repeat echocardiogram with similar findings.  Cardiology was consulted and patient will need outpatient cardiac MRI for further characterization of hypertrophic cardiomyopathy versus cardiac hypertrophy secondary to uncontrolled hypertension. -Continue with blood pressure control-goal is less than 140/90 -Patient was also started on carvedilol at 25 mg twice daily-dose can be titrated up to 50 mg twice daily with a goal heart rate of 60  HTN (hypertension) Accelerated hypertension presentation Baseline uncontrolled HTN  BP initially 250s/150s w/ active epistaxis  Patient was started on nicardipine infusion overnight due to persistently elevated blood pressure, infusion was discontinued by cardiology due to positive orthostatic vitals and recent history of syncope. -Patient was started on amlodipine and carvedilol -Monitor blood pressure  Alcohol use Patient admits to drinking at least 2 large beers daily No reported hard liquor use.  UDS positive for cannabinoid. Folate levels within lower normal limit. B1 levels pending. -Continue with supplement -Continue with CIWA protocol   Subjective: Patient was seen and examined today.  No more active nosebleed.  No other complaints.  He was feeling little dizzy while checking orthostatic vitals.  Per patient he was not using any of the medications or doctor follow-up for the past 3 years.  Physical Exam: Vitals:   02/18/23 1000 02/18/23 1100 02/18/23 1200 02/18/23 1300  BP: (!) 166/96 (!) 162/110 (!) 166/113 (!) 178/103  Pulse: 74 77 75 70  Resp: '11 18 20 10  '$ Temp:   98.6 F (37 C)   TempSrc:   Axillary   SpO2: 98% 96% 98% 99%  Weight:      Height:       General.  Well-developed gentleman, in no acute distress. Pulmonary.  Lungs clear bilaterally, normal respiratory effort. CV.  Regular  rate and rhythm, no JVD, rub or murmur. Abdomen.  Soft, nontender, nondistended, BS positive. CNS.  Alert and oriented .  No focal neurologic deficit. Extremities.  No edema, no cyanosis, pulses intact and symmetrical. Psychiatry.  Judgment and insight appears normal.   Data Reviewed: Prior data reviewed  Family Communication: Discussed with son on phone.  Disposition: Status is: Inpatient Remains inpatient appropriate because: Severity of illness  Planned Discharge Destination: Home   Time spent: 50 minutes  This record has been created using Systems analyst. Errors have been sought and corrected,but may not always be located. Such creation errors do not reflect on the standard of care.   Author: Lorella Nimrod, MD 02/18/2023 2:05 PM  For on call review [email or web address].

## 2023-02-18 NOTE — Assessment & Plan Note (Signed)
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 Creatinine 1.87>>1.84 on presentation.  Patient with no follow-up after 2020 when he was presented with concern of AKI.  Most likely have CKD due to uncontrolled hypertension. Cardiology ordered renal artery duplex scan. 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 -Monitor renal function -Avoid nephrotoxins

## 2023-02-18 NOTE — Assessment & Plan Note (Signed)
Accelerated hypertension presentation Baseline uncontrolled HTN  BP initially 250s/150s w/ active epistaxis  Patient was started on nicardipine infusion overnight due to persistently elevated blood pressure, infusion was discontinued by cardiology due to positive orthostatic vitals and recent history of syncope. -Patient was started on amlodipine and carvedilol -Monitor blood pressure

## 2023-02-18 NOTE — Assessment & Plan Note (Signed)
Noted significant syncopal episode in ER  Suspect secondary to  vasovagal event with bilateral nasal packing for epistaxis and hypertensive urgency Patient now back to baseline Patient has positive orthostatic vitals, baseline blood pressure significantly elevated.  Echocardiogram with hypertrophic cardiomyopathy which can be due to uncontrolled hypertension.  Cardiology was consulted and they were recommending outpatient MRI evaluation for further characterization. -Patient might need ZIO monitor on discharge -Continue to monitor

## 2023-02-18 NOTE — Progress Notes (Signed)
Rounding Note    Patient Name: Edwin Green Date of Encounter: 02/18/2023  [place] Cardiologist: Kathlyn Sacramento, MD   Subjective   Bps elevated overnight and patient was started on Nicardipine drip. Patient denies chest pain or SOB. Orthostatics positive this AM.   Inpatient Medications    Scheduled Meds:  amLODipine  10 mg Oral Daily   carvedilol  12.5 mg Oral BID WC   Chlorhexidine Gluconate Cloth  6 each Topical Daily   nicotine  7 mg Transdermal Daily   Continuous Infusions:  sodium chloride 75 mL/hr at 02/18/23 0540   niCARDipine 2 mg/hr (02/18/23 0645)   PRN Meds: acetaminophen, ondansetron **OR** ondansetron (ZOFRAN) IV, mouth rinse   Vital Signs    Vitals:   02/18/23 0600 02/18/23 0700 02/18/23 0715 02/18/23 0730  BP: (!) 168/100  (!) 179/106 (!) 169/105  Pulse: 80 (!) 110 84 93  Resp: '11 12 11 13  '$ Temp:      TempSrc:      SpO2: 97% 97% 96% 95%  Weight:      Height:        Intake/Output Summary (Last 24 hours) at 02/18/2023 0749 Last data filed at 02/18/2023 [id number] Gross per 24 hour  Intake 1205.45 ml  Output 3350 ml  Net -2144.55 ml      02/17/2023    2:19 PM 08/05/2019   11:16 AM 06/26/2019   11:07 PM  Last 3 Weights  Weight (lbs) 195 lb 1.7 oz 207 lb 193 lb 6.4 oz  Weight (kg) 88.5 kg 93.895 kg 87.726 kg      Telemetry    NSRST Hr 90-110bpm - Personally Reviewed  ECG    No new - Personally Reviewed  Physical Exam   GEN: No acute distress.   Neck: No JVD Cardiac: RRR, no murmurs, rubs, or gallops.  Respiratory: Clear to auscultation bilaterally. GI: Soft, nontender, non-distended  MS: No edema; No deformity. Neuro:  Nonfocal  Psych: Normal affect   Labs    High Sensitivity Troponin:  No results for input(s): "TROPONINIHS" in the last 720 hours.   Chemistry Recent Labs  Lab 02/17/23 0716 02/18/23 0404  NA 137 138  K 4.0 3.8  CL 109 109  CO2 22 22  GLUCOSE 117* 120*  BUN 30* 25*  CREATININE 1.87* 1.84*   CALCIUM 8.1* 7.7*  PROT 7.1 5.9*  ALBUMIN 3.8 3.2*  AST 29 19  ALT 30 22  ALKPHOS 61 51  BILITOT 0.6 0.6  GFRNONAA 41* 42*  ANIONGAP 6 7    Lipids No results for input(s): "CHOL", "TRIG", "HDL", "LABVLDL", "LDLCALC", "CHOLHDL" in the last 168 hours.  Hematology Recent Labs  Lab 02/17/23 0716 02/18/23 0404  WBC 4.1 5.0  RBC 4.21* 3.35*  HGB 12.6* 10.0*  HCT 39.2 31.1*  MCV 93.1 92.8  MCH 29.9 29.9  MCHC 32.1 32.2  RDW 13.5 13.5  PLT 176 119*   Thyroid No results for input(s): "TSH", "FREET4" in the last 168 hours.  BNPNo results for input(s): "BNP", "PROBNP" in the last 168 hours.  DDimer No results for input(s): "DDIMER" in the last 168 hours.   Radiology    ECHOCARDIOGRAM COMPLETE  Result Date: 02/17/2023    ECHOCARDIOGRAM REPORT   Patient Name:   Edwin Green Date of Exam: 02/17/2023 Medical Rec #:  [id number]       Height:       73.0 in Accession #:    [id number]      Weight:  207.0 lb Date of Birth:  02-28-1961       BSA:          2.183 m Patient Age:    62 years        BP:           199/126 mmHg Patient Gender: M               HR:           70 bpm. Exam Location:  [facility] Procedure: 2D Echo, Cardiac Doppler and Color Doppler Indications:     Hypertrophic obstructive cardiomyopathy  History:         Patient has prior history of Echocardiogram examinations, most                  recent 06/27/2019. Hypertrophic Cardiomyopathy,                  Signs/Symptoms:Chest Pain and Syncope; Risk                  Factors:Hypertension.  Sonographer:     Wenda Low Referring Phys:  [place] Diagnosing Phys: Kathlyn Sacramento MD IMPRESSIONS  1. Left ventricular ejection fraction, by estimation, is 60 to 65%. The left ventricle has normal function. The left ventricle has no regional wall motion abnormalities. There is severe eccentric left ventricular hypertrophy. Intravventriuclar septum measures 2.3 cm and posterior wall 1.9 cm. No evidence of LVOT obstruction or SAM. Left  ventricular diastolic parameters are consistent with Grade I diastolic dysfunction (impaired relaxation).  2. Right ventricular systolic function is normal. The right ventricular size is normal. Tricuspid regurgitation signal is inadequate for assessing PA pressure.  3. Left atrial size was moderately dilated.  4. The mitral valve is normal in structure. No evidence of mitral valve regurgitation. No evidence of mitral stenosis.  5. The aortic valve is normal in structure. Aortic valve regurgitation is trivial. No aortic stenosis is present.  6. Aortic dilatation noted. There is mild dilatation of the aortic root, measuring 40 mm. FINDINGS  Left Ventricle: Left ventricular ejection fraction, by estimation, is 60 to 65%. The left ventricle has normal function. The left ventricle has no regional wall motion abnormalities. The left ventricular internal cavity size was normal in size. There is  severe eccentric left ventricular hypertrophy. Left ventricular diastolic parameters are consistent with Grade I diastolic dysfunction (impaired relaxation). Right Ventricle: The right ventricular size is normal. No increase in right ventricular wall thickness. Right ventricular systolic function is normal. Tricuspid regurgitation signal is inadequate for assessing PA pressure. Left Atrium: Left atrial size was moderately dilated. Right Atrium: Right atrial size was normal in size. Pericardium: There is no evidence of pericardial effusion. Mitral Valve: The mitral valve is normal in structure. No evidence of mitral valve regurgitation. No evidence of mitral valve stenosis. MV peak gradient, 4.8 mmHg. The mean mitral valve gradient is 1.0 mmHg. Tricuspid Valve: The tricuspid valve is normal in structure. Tricuspid valve regurgitation is not demonstrated. No evidence of tricuspid stenosis. Aortic Valve: The aortic valve is normal in structure. Aortic valve regurgitation is trivial. No aortic stenosis is present. Aortic valve mean  gradient measures 4.0 mmHg. Aortic valve peak gradient measures 6.0 mmHg. Aortic valve area, by VTI measures 2.84 cm. Pulmonic Valve: The pulmonic valve was normal in structure. Pulmonic valve regurgitation is not visualized. No evidence of pulmonic stenosis. Aorta: Aortic dilatation noted. There is mild dilatation of the aortic root, measuring 40 mm. Venous:  The inferior vena cava was not well visualized. IAS/Shunts: No atrial level shunt detected by color flow Doppler.  LEFT VENTRICLE PLAX 2D LVIDd:         4.90 cm   Diastology LVIDs:         3.20 cm   LV e' medial:    3.81 cm/s LV PW:         1.90 cm   LV E/e' medial:  17.9 LV IVS:        2.30 cm   LV e' lateral:   3.37 cm/s LVOT diam:     2.10 cm   LV E/e' lateral: 20.2 LV SV:         77 LV SV Index:   35 LVOT Area:     3.46 cm  RIGHT VENTRICLE RV Basal diam:  3.45 cm RV Mid diam:    3.00 cm RV S prime:     14.70 cm/s LEFT ATRIUM            Index        RIGHT ATRIUM           Index LA diam:      5.00 cm  2.29 cm/m   RA Area:     15.00 cm LA Vol (A2C): 110.0 ml 50.38 ml/m  RA Volume:   28.70 ml  13.15 ml/m LA Vol (A4C): 65.1 ml  29.82 ml/m  AORTIC VALVE                    PULMONIC VALVE AV Area (Vmax):    3.01 cm     PV Vmax:       0.82 m/s AV Area (Vmean):   3.05 cm     PV Peak grad:  2.7 mmHg AV Area (VTI):     2.84 cm AV Vmax:           122.00 cm/s AV Vmean:          88.400 cm/s AV VTI:            0.270 m AV Peak Grad:      6.0 mmHg AV Mean Grad:      4.0 mmHg LVOT Vmax:         106.00 cm/s LVOT Vmean:        77.900 cm/s LVOT VTI:          0.221 m LVOT/AV VTI ratio: 0.82  AORTA Ao Root diam: 4.00 cm Ao Asc diam:  3.80 cm MITRAL VALVE MV Area (PHT): 3.92 cm    SHUNTS MV Area VTI:   2.83 cm    Systemic VTI:  0.22 m MV Peak grad:  4.8 mmHg    Systemic Diam: 2.10 cm MV Mean grad:  1.0 mmHg MV Vmax:       1.09 m/s MV Vmean:      48.7 cm/s MV Decel Time: 194 msec MV E velocity: 68.15 cm/s MV A velocity: 88.80 cm/s MV E/A ratio:  0.77 Kathlyn Sacramento MD  Electronically signed by Kathlyn Sacramento MD Signature Date/Time: 02/17/2023/2:35:07 PM    Final     Cardiac Studies   Echo 02/17/23 1. Left ventricular ejection fraction, by estimation, is 60 to 65%. The  left ventricle has normal function. The left ventricle has no regional  wall motion abnormalities. There is severe eccentric left ventricular  hypertrophy. Intravventriuclar septum  measures 2.3 cm and posterior wall 1.9 cm. No evidence of LVOT obstruction  or SAM. Left ventricular diastolic  parameters are consistent with Grade I  diastolic dysfunction (impaired relaxation).   2. Right ventricular systolic function is normal. The right ventricular  size is normal. Tricuspid regurgitation signal is inadequate for assessing  PA pressure.   3. Left atrial size was moderately dilated.   4. The mitral valve is normal in structure. No evidence of mitral valve  regurgitation. No evidence of mitral stenosis.   5. The aortic valve is normal in structure. Aortic valve regurgitation is  trivial. No aortic stenosis is present.   6. Aortic dilatation noted. There is mild dilatation of the aortic root,  measuring 40 mm.   Echo 06/2019 1. The left ventricle has normal systolic function with an ejection  fraction of 60-65%. The cavity size was normal. There is severe concentric  left ventricular hypertrophy with near cavity obliteration in systole.  Left ventricular diastolic Doppler  parameters are consistent with impaired relaxation.   2. The right ventricle has normal systolic function. The cavity was  normal. There is no increase in right ventricular wall thickness.Unable to  estimate RVSP   3. There is dilatation of the aortic root 4.0 cm . ascending aorta not  well visualized    Myoview Lexiscan 06/2019   Narrative & Impression  T wave inversion was noted during stress in the II, III, aVF, V5, V6 and I leads. The study is normal. This is a low risk study. The left ventricular ejection  fraction is normal (55-65%).    Patient Profile     62 y.o. male ith a hx of CKD stage II, epistaxis, hypertension, hypertrophic cardiomyopathy who is being seen 02/17/2023 for the evaluation of syncope and hypertension.  Assessment & Plan    Pre-syncope - pre-syncope in the setting of epistaxis s/p nasal packing and severe HTN, suspect vasovagal vs orthostatic hypotension - orthostatics positive this AM - h/o HCM and syncope in 2020 felt to be due to dehydration in the setting of HCM. Heart monitor and cMRI were ordered but not completed. Pt had been referred to EP, but patient was lost to follow-up. - echo this admission showed LVEF 60-65%, no WMA, severe LVH, no LVOT obstruction, G1DD, normal RVSF - Tele shows SR/ST - Hgb stable - will likely need  heart monitor at discharge   Epistaxis - severe epistaxis s/p nasal packing due to uncontrolled hypertension - ENT following   Hypertension - long history of uncontrolled HTN non compliant with medications - on admission 250s/150s s/p IV labetolol - PTA Verapamil '120mg'$  daily and Losartan '25mg'$  daily - Verapamil '120mg'$  BID restarted on admission - Given AKI/CKD no  Losartan - recommend to avoid diuretics and vasodilators given HCM - Verapamil stopped patient was started on Coreg 12.'5mg'$  BID and amlodipine '10mg'$  daily  - IV nicardipine '2mg'$ /hr started overnight - Bps improving, however orthostatics positive as above - would wean Nicardipine. Can increase Coreg to '25mg'$ BID   Hypertrophic cardiomyopathy - found on echo in 2020 in the setting of syncope. Echo showed LVEF 60-65%, severe concentric LVH with near cavity obliteration in systolic, impaired relaxation - heart monitor not completed in 2020 - referred to EP but lost to follow-up - cMRI Was ordered, but not completed - repeat echo showed LVEF 60-65%, no WMA, severe LVH, intraventricular septum 2.3 cm and posterior wall 1.9 cm. NO LVOT or SAM, G1DD. - avoid diuretics and  vasodilators - will need further work-up as OP   Alcohol use - he drinks 1-2 large beers daily - CIWA per IM  AKI - Scr 1.9 on admission>improving  For questions or updates, please contact [place] Please consult [email or web address] for contact info under        Signed, Anah Billard Ninfa Meeker, PA-C  02/18/2023, 7:49 AM

## 2023-02-18 NOTE — Assessment & Plan Note (Signed)
Severe epistaxis requiring bilateral nasal packing in setting of uncontrolled hypertension hypertensive urgency Bleeding now stabilized status post bilateral nasal packing ENT was consulted and they would like to slowly deflate balloon on the left in a day and advising to keep the packing in for 5 days on the right. Cont nasal packing  Titrate BP  Patient need to follow-up with ENT as an outpatient

## 2023-02-18 NOTE — Progress Notes (Signed)
       CROSS COVER NOTE  NAME: Jeraldo Vivier MRN: [id number] DOB : [birth year] ATTENDING PHYSICIAN: Deneise Lever, MD    Date of Service   02/18/2023   HPI/Events of Note   Message received from RN reporting elevated BP --> 180/109.  Mr Leseberg is a 62 yo M with PMH CKD-II, hypertension, and hypertrophic cardiomyopathy. He presented to [facility] ED with accelerated hypertension, epistaxis, and had a syncopal event after nasal packing placed.   Interventions   Assessment/Plan:  Symptomatic Hypertension with Headache  Most recent BP 180/109-->169/125 Mr Zeltner is reporting a 5/10 headache that has been intermittent since ~1600 yesterday evening. Tylenol given previously with minimal effect.  Labetalol 20 mg IV Compazine with Benadryl 0330: Spoke with Dr Caryl Comes, [facility] on call, regarding BP 204/124 HR 67 despite IV labetalol and PO med changes yesterday. Agrees with Cardene drip, recommends slow titration for goal SBP <190. If patient becomes tachycardic with Cardene recommends discontinuing Cardene and giving Diltiazem IV push.       To reach the provider On-Call:   7AM- 7PM see care teams to locate the attending and reach out to them via [email or web address] Password: [email or web address] 7PM-7AM contact night-coverage If you still have difficulty reaching the appropriate provider, please page the [facility] (Director on Call) for [facility] on amion for assistance  This document was prepared using Systems analyst and may include unintentional dictation errors.  Neomia Glass DNP, MBA, FNP-BC, PMHNP-BC Nurse Practitioner [facility] Pager [phone]

## 2023-02-18 NOTE — Assessment & Plan Note (Signed)
Noted severe hypertrophic cardiomyopathy on 2D echo July 2020 Has had no follow-up or on appropriate antihypertensive medication Repeat echocardiogram with similar findings.  Cardiology was consulted and patient will need outpatient cardiac MRI for further characterization of hypertrophic cardiomyopathy versus cardiac hypertrophy secondary to uncontrolled hypertension. -Continue with blood pressure control-goal is less than 140/90 -Patient was also started on carvedilol at 25 mg twice daily-dose can be titrated up to 50 mg twice daily with a goal heart rate of 60

## 2023-02-18 NOTE — Assessment & Plan Note (Signed)
Patient admits to drinking at least 2 large beers daily No reported hard liquor use.  UDS positive for cannabinoid. Folate levels within lower normal limit. B1 levels pending. -Continue with supplement -Continue with CIWA protocol

## 2023-02-18 NOTE — Hospital Course (Addendum)
Taken from H&P.   Edwin Green is a 62 y.o. male with medical history significant of stage II CKD, epistaxis, hypertension, hypertrophic cardiomyopathy presenting with epistaxis, syncope, hypertensive urgency, alcohol use, AKI.  Patient reports sudden onset of nosebleeding over the past 3 to 4 days.  Patient was not taking his medications. for. No fevers or chills. No nausea or vomiting. No reported chest pain. Has had minimal to mild nosebleeds in the past that have been fairly well-controlled. 1/4 pack/day smoker. Drinks at least 2 large beers daily. Denies any other illicit drug use.   ED course and data reviewed.  On presentation patient has significantly elevated blood pressure in the 260s over 150s.  Bilateral nares were packed with noted subsequent episode of syncope/vasovagal event.  Symptoms resolved.  ENT was consulted and recommending slowly deflating left balloon once blood pressure has been improved, recommending keeping right balloon for 5 days.  Patient initially received IV labetalol boluses with some transient improvement in blood pressure and subsequent increase.  He was transferred to stepdown on Cardene infusion overnight.  Cardiology was also consulted.  3/5: Blood pressure still elevated at 169/105, remained on Cardene infusion.  UDS positive for cannabinoid. CBC with decrease of hemoglobin to 10 from 12.6 on admission, platelets decreased to 119 from 176.  Creatinine at 1.84, no recent comparison has lost creatinine shown in his chart was 1.39 in 2020. Echo done yesterday with normal EF, no regional wall motion abnormalities.  Severe eccentric left ventricular hypertrophy.  No evidence of LVOT obstruction or SAM.  Grade 1 diastolic dysfunction.  Dilated left atrium, mildly dilated aortic root. Patient has positive orthostatic vitals so nicardipine infusion was discontinued by cardiology, they are starting low-dose amlodipine and carvedilol at 25 mg twice daily which can be  increased to 50 mg twice daily for target resting heart rate of 60 and blood pressure should be less than 140/90.  Patient will need outpatient cardiac MRI for further evaluation of hypertrophic cardiomyopathy versus hypertensive hypertrophy due to his history of prolonged uncontrolled hypertension and not taking any medications.  Cardiology might consider Zio patch on discharge.  3/6: Blood pressure is still elevated at 165/95.  Renal function seems stable.  Calculated Fina was 2.1% which is consistent with intrinsic renal disease, most likely he has CKD secondary to uncontrolled hypertension.  Patient was started on low-dose losartan for better control of blood pressure.  Renal venous Doppler with normal kidneys and no significant arterial disease. Cardiac MRI planned for tomorrow  3/7; blood pressure still elevated but slowly improving, at 159/105 this morning.  Cardiac MRI today.  His left nasal packing was removed by ENT and partially deflated right balloon with a plan to remove in 2 days.  Cardiology would like to make some more changes to his antihypertensives and hopefully discharge tomorrow.  3/8: Hemodynamically stable.  Blood pressure mildly elevated at 154/108 this morning but improved up to 136/69 over the past 24 hours after taking medications.  Cardiology also added hydralazine 50 mg 3 times daily. Cardiac MRI consistent with HCM. ENT removed the second packing and there was no more concern of bleeding. Patient can use saline drops to help keep that area moist. He was counseled again for better control of blood pressure and stop smoking and drinking.  Patient is being discharged on amlodipine, losartan, carvedilol and hydralazine and need to have a close follow-up appointment with PCP and cardiology for further recommendations.  Patient will also need a follow-up with nephrology as  an outpatient for concern of CKD stage IIIb.  Patient will continue on current medications.  Discussed  with son on phone.

## 2023-02-18 NOTE — Plan of Care (Signed)
  Problem: Education: Goal: Knowledge of General Education information will improve Description: Including pain rating scale, medication(s)/side effects and non-pharmacologic comfort measures Outcome: Progressing   Problem: Health Behavior/Discharge Planning: Goal: Ability to manage health-related needs will improve Outcome: Progressing   Problem: Clinical Measurements: Goal: Ability to maintain clinical measurements within normal limits will improve Outcome: Progressing   Problem: Pain Managment: Goal: General experience of comfort will improve Outcome: Adequate for Discharge   Problem: Safety: Goal: Ability to remain free from injury will improve Outcome: Adequate for Discharge

## 2023-02-19 ENCOUNTER — Inpatient Hospital Stay: Payer: 59

## 2023-02-19 DIAGNOSIS — I160 Hypertensive urgency: Secondary | ICD-10-CM

## 2023-02-19 DIAGNOSIS — I517 Cardiomegaly: Secondary | ICD-10-CM

## 2023-02-19 DIAGNOSIS — R55 Syncope and collapse: Secondary | ICD-10-CM | POA: Diagnosis not present

## 2023-02-19 DIAGNOSIS — I422 Other hypertrophic cardiomyopathy: Secondary | ICD-10-CM | POA: Diagnosis not present

## 2023-02-19 DIAGNOSIS — R040 Epistaxis: Secondary | ICD-10-CM | POA: Diagnosis not present

## 2023-02-19 LAB — CBC
HCT: 30.7 % — ABNORMAL LOW (ref 39.0–52.0)
Hemoglobin: 10.2 g/dL — ABNORMAL LOW (ref 13.0–17.0)
MCH: 30.5 pg (ref 26.0–34.0)
MCHC: 33.2 g/dL (ref 30.0–36.0)
MCV: 91.9 fL (ref 80.0–100.0)
Platelets: 147 10*3/uL — ABNORMAL LOW (ref 150–400)
RBC: 3.34 MIL/uL — ABNORMAL LOW (ref 4.22–5.81)
RDW: 13.7 % (ref 11.5–15.5)
WBC: 4.6 10*3/uL (ref 4.0–10.5)
nRBC: 0 % (ref 0.0–0.2)

## 2023-02-19 LAB — RENAL FUNCTION PANEL
Albumin: 3.2 g/dL — ABNORMAL LOW (ref 3.5–5.0)
Anion gap: 5 (ref 5–15)
BUN: 20 mg/dL (ref 6–20)
CO2: 23 mmol/L (ref 22–32)
Calcium: 8 mg/dL — ABNORMAL LOW (ref 8.9–10.3)
Chloride: 108 mmol/L (ref 98–111)
Creatinine, Ser: 1.88 mg/dL — ABNORMAL HIGH (ref 0.61–1.24)
GFR, Estimated: 41 mL/min — ABNORMAL LOW (ref >60)
Glucose, Bld: 114 mg/dL — ABNORMAL HIGH (ref 70–99)
Phosphorus: 2.1 mg/dL — ABNORMAL LOW (ref 2.5–4.6)
Potassium: 4.1 mmol/L (ref 3.5–5.1)
Sodium: 136 mmol/L (ref 135–145)

## 2023-02-19 LAB — VITAMIN B1: Vitamin B1 (Thiamine): 75.7 nmol/L (ref 66.5–200.0)

## 2023-02-19 MED ORDER — LOSARTAN POTASSIUM 50 MG PO TABS
25.0000 mg | ORAL_TABLET | Freq: Every day | ORAL | Status: DC
  Administered 2023-02-19 – 2023-02-20 (×2): 25 mg via ORAL
  Filled 2023-02-19 (×2): qty 1

## 2023-02-19 NOTE — Progress Notes (Signed)
02/19/2023 6:12 PM  Edwin Green, [place] [id number]  He has had nasal packing in for 3 days now.  He has not had any bleeding.  He still is having challenges keeping his blood pressure controlled    Temp:  [97.8 F (36.6 C)-98.3 F (36.8 C)] 98.1 F (36.7 C) (03/06 1500) Pulse Rate:  [61-81] 74 (03/06 1500) Resp:  [10-24] 18 (03/06 1500) BP: (123-193)/(94-128) 151/121 (03/06 1500) SpO2:  [93 %-100 %] 96 % (03/06 1500),     Intake/Output Summary (Last 24 hours) at 02/19/2023 1812 Last data filed at 02/19/2023 1700 Gross per 24 hour  Intake 960 ml  Output 2400 ml  Net -1440 ml    Results for orders placed or performed during the hospital encounter of 02/17/23 (from the past 24 hour(s))  Renal function panel     Status: Abnormal   Collection Time: 02/19/23  5:56 AM  Result Value Ref Range   Sodium 136 135 - 145 mmol/L   Potassium 4.1 3.5 - 5.1 mmol/L   Chloride 108 98 - 111 mmol/L   CO2 23 22 - 32 mmol/L   Glucose, Bld 114 (H) 70 - 99 mg/dL   BUN 20 6 - 20 mg/dL   Creatinine, Ser 1.88 (H) 0.61 - 1.24 mg/dL   Calcium 8.0 (L) 8.9 - 10.3 mg/dL   Phosphorus 2.1 (L) 2.5 - 4.6 mg/dL   Albumin 3.2 (L) 3.5 - 5.0 g/dL   GFR, Estimated 41 (L) >60 mL/min   Anion gap 5 5 - 15  CBC     Status: Abnormal   Collection Time: 02/19/23  5:56 AM  Result Value Ref Range   WBC 4.6 4.0 - 10.5 K/uL   RBC 3.34 (L) 4.22 - 5.81 MIL/uL   Hemoglobin 10.2 (L) 13.0 - 17.0 g/dL   HCT 30.7 (L) 39.0 - 52.0 %   MCV 91.9 80.0 - 100.0 fL   MCH 30.5 26.0 - 34.0 pg   MCHC 33.2 30.0 - 36.0 g/dL   RDW 13.7 11.5 - 15.5 %   Platelets 147 (L) 150 - 400 K/uL   nRBC 0.0 0.0 - 0.2 %    SUBJECTIVE: He has some discomfort from the packing in his nose.  His left packing was removed today.  OBJECTIVE: He still has significant elevated blood pressures, running about 160/110.  This fluctuates some during the day but is always remain with diastolics above [id number].  He has not had any signs of bleeding from his nose.  His left  pack is gone.  His right pack is in place but the area has been deflated from it now which helps resolve the pressure in his face.  IMPRESSION: He had epistaxis that was not controlled because of the hypertension.  His bleeding is now stopped.  I want to leave the balloon in his right nostril even though it does not have air in it.  This is keeping the nose moist to allow the mucosa to heal.  PLAN: Will plan to remove the balloon from his right side in 2 days if he does not have any further bleeding.  This should be healed well enough that bleeding would not restart.  He can use some lubrication to his nose as needed if it should be dry.  He is not requiring any oxygen but nasal cannula which would dry out his nose further.  [name] 02/19/2023, 6:12 PM

## 2023-02-19 NOTE — Progress Notes (Signed)
Order obtained from [facility] for pt to go downstairs for renal US without RN, pt will be still monitored remotely for vs changes and treat as needed.

## 2023-02-19 NOTE — Assessment & Plan Note (Signed)
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 Creatinine 1.87>>1.84 on presentation.  Patient with no follow-up after 2020 when he was presented with concern of AKI.  Most likely have CKD due to uncontrolled hypertension.  Fina of 2.1% which makes it intrinsic renal disease. Renal arterial duplex was negative for any significant stenosis, normal kidneys. 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 -Monitor renal function -Avoid nephrotoxins

## 2023-02-19 NOTE — Progress Notes (Signed)
Notified NP Foust about elevated blood pressure, orders given to continue monitoring and administer iv labetalol for elevated blood pressure.

## 2023-02-19 NOTE — Progress Notes (Signed)
Progress Note   Patient: Edwin Green [id number] DOB: 07/20/1961 DOA: 02/17/2023     1 DOS: the patient was seen and examined on 02/19/2023   Brief hospital course: Taken from H&P.   Edwin Green is a 62 y.o. male with medical history significant of stage II CKD, epistaxis, hypertension, hypertrophic cardiomyopathy presenting with epistaxis, syncope, hypertensive urgency, alcohol use, AKI.  Patient reports sudden onset of nosebleeding over the past 3 to 4 days.  Patient was not taking his medications. for. No fevers or chills. No nausea or vomiting. No reported chest pain. Has had minimal to mild nosebleeds in the past that have been fairly well-controlled. 1/4 pack/day smoker. Drinks at least 2 large beers daily. Denies any other illicit drug use.   ED course and data reviewed.  On presentation patient has significantly elevated blood pressure in the 260s over 150s.  Bilateral nares were packed with noted subsequent episode of syncope/vasovagal event.  Symptoms resolved.  ENT was consulted and recommending slowly deflating left balloon once blood pressure has been improved, recommending keeping right balloon for 5 days.  Patient initially received IV labetalol boluses with some transient improvement in blood pressure and subsequent increase.  He was transferred to stepdown on Cardene infusion overnight.  Cardiology was also consulted.  3/5: Blood pressure still elevated at 169/105, remained on Cardene infusion.  UDS positive for cannabinoid. CBC with decrease of hemoglobin to 10 from 12.6 on admission, platelets decreased to 119 from 176.  Creatinine at 1.84, no recent comparison has lost creatinine shown in his chart was 1.39 in 2020. Echo done yesterday with normal EF, no regional wall motion abnormalities.  Severe eccentric left ventricular hypertrophy.  No evidence of LVOT obstruction or SAM.  Grade 1 diastolic dysfunction.  Dilated left atrium, mildly dilated aortic root. Patient has  positive orthostatic vitals so nicardipine infusion was discontinued by cardiology, they are starting low-dose amlodipine and carvedilol at 25 mg twice daily which can be increased to 50 mg twice daily for target resting heart rate of 60 and blood pressure should be less than 140/90.  Patient will need outpatient cardiac MRI for further evaluation of hypertrophic cardiomyopathy versus hypertensive hypertrophy due to his history of prolonged uncontrolled hypertension and not taking any medications.  Cardiology might consider Zio patch on discharge.  3/6: Blood pressure is still elevated at 165/95.  Renal function seems stable.  Calculated Fina was 2.1% which is consistent with intrinsic renal disease, most likely he has CKD secondary to uncontrolled hypertension.  Patient was started on low-dose losartan for better control of blood pressure.  Renal venous Doppler with normal kidneys and no significant arterial disease. Cardiac MRI planned for tomorrow      Assessment and Plan: * Epistaxis Severe epistaxis requiring bilateral nasal packing in setting of uncontrolled hypertension hypertensive urgency Bleeding now stabilized status post bilateral nasal packing ENT was consulted and they would like to slowly deflate balloon on the left in a day and advising to keep the packing in for 5 days on the right. Cont nasal packing  Titrate BP  Patient need to follow-up with ENT as an outpatient  Syncope Noted significant syncopal episode in ER  Suspect secondary to  vasovagal event with bilateral nasal packing for epistaxis and hypertensive urgency Patient now back to baseline Patient has positive orthostatic vitals, baseline blood pressure significantly elevated.  Echocardiogram with hypertrophic cardiomyopathy which can be due to uncontrolled hypertension.   -Patient might need ZIO monitor on discharge -Continue to monitor  AKI (acute kidney injury) (HCC) Versus CKD stage IIIb. Creatinine 1.87>>1.84  on presentation.  Patient with no follow-up after 2020 when he was presented with concern of AKI.  Most likely have CKD due to uncontrolled hypertension.  Fina of 2.1% which makes it intrinsic renal disease. Renal arterial duplex was negative for any significant stenosis, normal kidneys. 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 -Monitor renal function -Avoid nephrotoxins   Hypertrophic cardiomyopathy ([place]) Noted severe hypertrophic cardiomyopathy on 2D echo July 2020 Has had no follow-up or on appropriate antihypertensive medication Repeat echocardiogram with similar findings.  Cardiology was consulted and they are now planning cardiac MRI tomorrow for further characterization of hypertrophic cardiomyopathy versus cardiac hypertrophy secondary to uncontrolled hypertension. -Continue with blood pressure control-goal is less than 140/90 -Patient was also started on carvedilol at 25 mg twice daily-dose can be titrated up to 50 mg twice daily with a goal heart rate of 60 -Low-dose losartan was also added  HTN (hypertension) Accelerated hypertension presentation Baseline uncontrolled HTN  BP initially 250s/150s w/ active epistaxis  Patient was started on nicardipine infusion overnight due to persistently elevated blood pressure, infusion was discontinued by cardiology due to positive orthostatic vitals and recent history of syncope. -Patient was started on amlodipine and carvedilol -Low-dose losartan was also added today -Monitor blood pressure  Alcohol use Patient admits to drinking at least 2 large beers daily No reported hard liquor use.  UDS positive for cannabinoid. Folate levels within lower normal limit. B1 levels pending. -Continue with supplement -Continue with CIWA protocol   Subjective: Patient with no new concerns today.  Going for renal ultrasound and arterial duplex.  Physical Exam: Vitals:   02/19/23 1305 02/19/23 1308 02/19/23 1400 02/19/23 1500  BP: (!) 173/120 (!)  172/101 (!) 168/94 (!) 151/121  Pulse: 64 61 72 74  Resp: '13 13 18 18  '$ Temp:      TempSrc:      SpO2: 99% 98% 97% 96%  Weight:      Height:       General.     In no acute distress. Pulmonary.  Lungs clear bilaterally, normal respiratory effort. CV.  Regular rate and rhythm, no JVD, rub or murmur. Abdomen.  Soft, nontender, nondistended, BS positive. CNS.  Alert and oriented .  No focal neurologic deficit. Extremities.  No edema, no cyanosis, pulses intact and symmetrical. Psychiatry.  Judgment and insight appears normal.   Data Reviewed: Prior data reviewed  Family Communication: .  Disposition: Status is: Inpatient Remains inpatient appropriate because: Severity of illness  Planned Discharge Destination: Home   Time spent: 45 minutes  This record has been created using Systems analyst. Errors have been sought and corrected,but may not always be located. Such creation errors do not reflect on the standard of care.   Author: Lorella Nimrod, MD 02/19/2023 4:02 PM  For on call review [email or web address].

## 2023-02-19 NOTE — Progress Notes (Signed)
Rounding Note    Patient Name: Edwin Green Date of Encounter: 02/19/2023  [place] Cardiologist: Kathlyn Sacramento, MD   Subjective   Bps still elevated at times. Kidney function stable. The patient denies chest pain or shortness of breath. No further bleeding from the nose.   Inpatient Medications    Scheduled Meds:  amLODipine  10 mg Oral Daily   carvedilol  25 mg Oral BID WC   Chlorhexidine Gluconate Cloth  6 each Topical Daily   losartan  25 mg Oral Daily   nicotine  7 mg Transdermal Daily   Continuous Infusions:  niCARDipine Stopped (02/18/23 0815)   PRN Meds: acetaminophen, labetalol, ondansetron **OR** ondansetron (ZOFRAN) IV, mouth rinse   Vital Signs    Vitals:   02/19/23 0500 02/19/23 0527 02/19/23 0600 02/19/23 0700  BP: (!) 193/115 (!) 175/110 (!) 179/114 (!) 163/115  Pulse: 69 71 70 69  Resp: (!) '22 13 16 13  '$ Temp:      TempSrc:      SpO2: 99% 97% 98% 98%  Weight:      Height:        Intake/Output Summary (Last 24 hours) at 02/19/2023 0839 Last data filed at 02/19/2023 0601 Gross per 24 hour  Intake 1410.67 ml  Output 3100 ml  Net -1689.33 ml      02/17/2023    2:19 PM 08/05/2019   11:16 AM 06/26/2019   11:07 PM  Last 3 Weights  Weight (lbs) 195 lb 1.7 oz 207 lb 193 lb 6.4 oz  Weight (kg) 88.5 kg 93.895 kg 87.726 kg      Telemetry    SR HR 60s - Personally Reviewed  ECG    NO new - Personally Reviewed  Physical Exam   GEN: No acute distress.   Neck: No JVD Cardiac: RRR, no murmurs, rubs, or gallops.  Respiratory: Clear to auscultation bilaterally. GI: Soft, nontender, non-distended  MS: No edema; No deformity. Neuro:  Nonfocal  Psych: Normal affect   Labs    High Sensitivity Troponin:  No results for input(s): "TROPONINIHS" in the last 720 hours.   Chemistry Recent Labs  Lab 02/17/23 0716 02/18/23 0404 02/19/23 0556  NA 137 138 136  K 4.0 3.8 4.1  CL 109 109 108  CO2 '22 22 23  '$ GLUCOSE 117* 120* 114*  BUN  30* 25* 20  CREATININE 1.87* 1.84* 1.88*  CALCIUM 8.1* 7.7* 8.0*  PROT 7.1 5.9*  --   ALBUMIN 3.8 3.2* 3.2*  AST 29 19  --   ALT 30 22  --   ALKPHOS 61 51  --   BILITOT 0.6 0.6  --   GFRNONAA 41* 42* 41*  ANIONGAP '6 7 5    '$ Lipids No results for input(s): "CHOL", "TRIG", "HDL", "LABVLDL", "LDLCALC", "CHOLHDL" in the last 168 hours.  Hematology Recent Labs  Lab 02/17/23 0716 02/18/23 0404 02/19/23 0556  WBC 4.1 5.0 4.6  RBC 4.21* 3.35* 3.34*  HGB 12.6* 10.0* 10.2*  HCT 39.2 31.1* 30.7*  MCV 93.1 92.8 91.9  MCH 29.9 29.9 30.5  MCHC 32.1 32.2 33.2  RDW 13.5 13.5 13.7  PLT 176 119* 147*   Thyroid No results for input(s): "TSH", "FREET4" in the last 168 hours.  BNPNo results for input(s): "BNP", "PROBNP" in the last 168 hours.  DDimer No results for input(s): "DDIMER" in the last 168 hours.   Radiology    ECHOCARDIOGRAM COMPLETE  Result Date: 02/17/2023    ECHOCARDIOGRAM REPORT  Patient Name:   NOBUO ROSETE Date of Exam: 02/17/2023 Medical Rec #:  [id number]       Height:       73.0 in Accession #:    [id number]      Weight:       207.0 lb Date of Birth:  01/02/1961       BSA:          2.183 m Patient Age:    4 years        BP:           199/126 mmHg Patient Gender: M               HR:           70 bpm. Exam Location:  [facility] Procedure: 2D Echo, Cardiac Doppler and Color Doppler Indications:     Hypertrophic obstructive cardiomyopathy  History:         Patient has prior history of Echocardiogram examinations, most                  recent 06/27/2019. Hypertrophic Cardiomyopathy,                  Signs/Symptoms:Chest Pain and Syncope; Risk                  Factors:Hypertension.  Sonographer:     Wenda Low Referring Phys:  [place] Diagnosing Phys: Kathlyn Sacramento MD IMPRESSIONS  1. Left ventricular ejection fraction, by estimation, is 60 to 65%. The left ventricle has normal function. The left ventricle has no regional wall motion abnormalities. There is severe eccentric  left ventricular hypertrophy. Intravventriuclar septum measures 2.3 cm and posterior wall 1.9 cm. No evidence of LVOT obstruction or SAM. Left ventricular diastolic parameters are consistent with Grade I diastolic dysfunction (impaired relaxation).  2. Right ventricular systolic function is normal. The right ventricular size is normal. Tricuspid regurgitation signal is inadequate for assessing PA pressure.  3. Left atrial size was moderately dilated.  4. The mitral valve is normal in structure. No evidence of mitral valve regurgitation. No evidence of mitral stenosis.  5. The aortic valve is normal in structure. Aortic valve regurgitation is trivial. No aortic stenosis is present.  6. Aortic dilatation noted. There is mild dilatation of the aortic root, measuring 40 mm. FINDINGS  Left Ventricle: Left ventricular ejection fraction, by estimation, is 60 to 65%. The left ventricle has normal function. The left ventricle has no regional wall motion abnormalities. The left ventricular internal cavity size was normal in size. There is  severe eccentric left ventricular hypertrophy. Left ventricular diastolic parameters are consistent with Grade I diastolic dysfunction (impaired relaxation). Right Ventricle: The right ventricular size is normal. No increase in right ventricular wall thickness. Right ventricular systolic function is normal. Tricuspid regurgitation signal is inadequate for assessing PA pressure. Left Atrium: Left atrial size was moderately dilated. Right Atrium: Right atrial size was normal in size. Pericardium: There is no evidence of pericardial effusion. Mitral Valve: The mitral valve is normal in structure. No evidence of mitral valve regurgitation. No evidence of mitral valve stenosis. MV peak gradient, 4.8 mmHg. The mean mitral valve gradient is 1.0 mmHg. Tricuspid Valve: The tricuspid valve is normal in structure. Tricuspid valve regurgitation is not demonstrated. No evidence of tricuspid stenosis.  Aortic Valve: The aortic valve is normal in structure. Aortic valve regurgitation is trivial. No aortic stenosis is present. Aortic valve mean gradient measures 4.0 mmHg. Aortic valve peak  gradient measures 6.0 mmHg. Aortic valve area, by VTI measures 2.84 cm. Pulmonic Valve: The pulmonic valve was normal in structure. Pulmonic valve regurgitation is not visualized. No evidence of pulmonic stenosis. Aorta: Aortic dilatation noted. There is mild dilatation of the aortic root, measuring 40 mm. Venous: The inferior vena cava was not well visualized. IAS/Shunts: No atrial level shunt detected by color flow Doppler.  LEFT VENTRICLE PLAX 2D LVIDd:         4.90 cm   Diastology LVIDs:         3.20 cm   LV e' medial:    3.81 cm/s LV PW:         1.90 cm   LV E/e' medial:  17.9 LV IVS:        2.30 cm   LV e' lateral:   3.37 cm/s LVOT diam:     2.10 cm   LV E/e' lateral: 20.2 LV SV:         77 LV SV Index:   35 LVOT Area:     3.46 cm  RIGHT VENTRICLE RV Basal diam:  3.45 cm RV Mid diam:    3.00 cm RV S prime:     14.70 cm/s LEFT ATRIUM            Index        RIGHT ATRIUM           Index LA diam:      5.00 cm  2.29 cm/m   RA Area:     15.00 cm LA Vol (A2C): 110.0 ml 50.38 ml/m  RA Volume:   28.70 ml  13.15 ml/m LA Vol (A4C): 65.1 ml  29.82 ml/m  AORTIC VALVE                    PULMONIC VALVE AV Area (Vmax):    3.01 cm     PV Vmax:       0.82 m/s AV Area (Vmean):   3.05 cm     PV Peak grad:  2.7 mmHg AV Area (VTI):     2.84 cm AV Vmax:           122.00 cm/s AV Vmean:          88.400 cm/s AV VTI:            0.270 m AV Peak Grad:      6.0 mmHg AV Mean Grad:      4.0 mmHg LVOT Vmax:         106.00 cm/s LVOT Vmean:        77.900 cm/s LVOT VTI:          0.221 m LVOT/AV VTI ratio: 0.82  AORTA Ao Root diam: 4.00 cm Ao Asc diam:  3.80 cm MITRAL VALVE MV Area (PHT): 3.92 cm    SHUNTS MV Area VTI:   2.83 cm    Systemic VTI:  0.22 m MV Peak grad:  4.8 mmHg    Systemic Diam: 2.10 cm MV Mean grad:  1.0 mmHg MV Vmax:       1.09  m/s MV Vmean:      48.7 cm/s MV Decel Time: 194 msec MV E velocity: 68.15 cm/s MV A velocity: 88.80 cm/s MV E/A ratio:  0.77 Kathlyn Sacramento MD Electronically signed by Kathlyn Sacramento MD Signature Date/Time: 02/17/2023/2:35:07 PM    Final     Cardiac Studies   Echo 02/17/23 1. Left ventricular ejection fraction, by estimation, is 60 to  65%. The  left ventricle has normal function. The left ventricle has no regional  wall motion abnormalities. There is severe eccentric left ventricular  hypertrophy. Intravventriuclar septum  measures 2.3 cm and posterior wall 1.9 cm. No evidence of LVOT obstruction  or SAM. Left ventricular diastolic parameters are consistent with Grade I  diastolic dysfunction (impaired relaxation).   2. Right ventricular systolic function is normal. The right ventricular  size is normal. Tricuspid regurgitation signal is inadequate for assessing  PA pressure.   3. Left atrial size was moderately dilated.   4. The mitral valve is normal in structure. No evidence of mitral valve  regurgitation. No evidence of mitral stenosis.   5. The aortic valve is normal in structure. Aortic valve regurgitation is  trivial. No aortic stenosis is present.   6. Aortic dilatation noted. There is mild dilatation of the aortic root,  measuring 40 mm.    Echo 06/2019 1. The left ventricle has normal systolic function with an ejection  fraction of 60-65%. The cavity size was normal. There is severe concentric  left ventricular hypertrophy with near cavity obliteration in systole.  Left ventricular diastolic Doppler  parameters are consistent with impaired relaxation.   2. The right ventricle has normal systolic function. The cavity was  normal. There is no increase in right ventricular wall thickness.Unable to  estimate RVSP   3. There is dilatation of the aortic root 4.0 cm . ascending aorta not  well visualized    Myoview Lexiscan 06/2019   Narrative & Impression  T wave inversion was  noted during stress in the II, III, aVF, V5, V6 and I leads. The study is normal. This is a low risk study. The left ventricular ejection fraction is normal (55-65%).    Patient Profile     62 y.o. male  with a hx of CKD stage II, epistaxis, hypertension, hypertrophic cardiomyopathy who is being seen 02/17/2023 for the evaluation of syncope and hypertension.   Assessment & Plan    Pre-syncope - pre-syncope in the setting of epistaxis s/p nasal packing and severe HTN, suspect vasovagal vs orthostatic hypotension - orthostatics positive  - h/o HCM and syncope in 2020 felt to be due to dehydration in the setting of HCM. Heart monitor and cMRI were ordered but not completed. Pt had been referred to EP, but patient was lost to follow-up. - echo this admission showed LVEF 60-65%, no WMA, severe LVH, no LVOT obstruction, G1DD, normal RVSF - Tele shows SR/ST, no arrythmias - Hgb stable - will likely need  heart monitor at discharge   Epistaxis - severe epistaxis s/p nasal packing due to uncontrolled hypertension - ENT following   Hypertension - long history of uncontrolled HTN non compliant with medications - on admission 250s/150s s/p IV labetolol - PTA Verapamil '120mg'$  daily and Losartan '25mg'$  daily>Verapamil '120mg'$  BID restarted on admission - Given AKI/CKD no Losartan - recommend to avoid diuretics and vasodilators given HCM - Verapamil was stopped patient was started on Coreg and amlodipine  - intermittently on IV nicardipine '2mg'$ /hr >now off - Bps improving, however orthostatics positive as above - Coreg increased to '25mg'$ BID. Unable to increase given HR in the 60s - Pending kidney function may be able to start ACE or ARB   Hypertrophic cardiomyopathy - found on echo in 2020 in the setting of syncope. Echo showed LVEF 60-65%, severe concentric LVH with near cavity obliteration in systolic, impaired relaxation - heart monitor not completed in 2020 - referred to EP  but lost to  follow-up - cMRI Was ordered, but not completed - repeat echo showed LVEF 60-65%, no WMA, severe LVH, intraventricular septum 2.3 cm and posterior wall 1.9 cm. NO LVOT or SAM, G1DD. - avoid diuretics and vasodilators - will need further work-up as OP   Alcohol use - he drinks 1-2 large beers daily - CIWA per IM   AKI - Scr 1.9 on admission>improving  For questions or updates, please contact [place] Please consult [email or web address] for contact info under        Signed, Bellamarie Pflug Ninfa Meeker, PA-C  02/19/2023, 8:39 AM

## 2023-02-19 NOTE — Assessment & Plan Note (Addendum)
Severe epistaxis requiring bilateral nasal packing in setting of uncontrolled hypertension hypertensive urgency Bleeding now stabilized status post bilateral nasal packing ENT was consulted and they would like to slowly deflate balloon on the left in a day and advising to keep the packing in for 5 days on the right. Cont nasal packing  Titrate BP  Patient need to follow-up with ENT as an outpatient

## 2023-02-19 NOTE — Assessment & Plan Note (Signed)
Noted significant syncopal episode in ER  Suspect secondary to  vasovagal event with bilateral nasal packing for epistaxis and hypertensive urgency Patient now back to baseline Patient has positive orthostatic vitals, baseline blood pressure significantly elevated.  Echocardiogram with hypertrophic cardiomyopathy which can be due to uncontrolled hypertension.   -Patient might need ZIO monitor on discharge -Continue to monitor

## 2023-02-19 NOTE — Progress Notes (Signed)
Pt off unit for renal US. Monitor on

## 2023-02-19 NOTE — Assessment & Plan Note (Addendum)
Noted severe hypertrophic cardiomyopathy on 2D echo July 2020 Has had no follow-up or on appropriate antihypertensive medication Repeat echocardiogram with similar findings.  Cardiology was consulted and they are now planning cardiac MRI tomorrow for further characterization of hypertrophic cardiomyopathy versus cardiac hypertrophy secondary to uncontrolled hypertension. -Continue with blood pressure control-goal is less than 140/90 -Patient was also started on carvedilol at 25 mg twice daily-dose can be titrated up to 50 mg twice daily with a goal heart rate of 60 -Low-dose losartan was also added

## 2023-02-19 NOTE — Assessment & Plan Note (Signed)
Improving Accelerated hypertension presentation Baseline uncontrolled HTN  BP initially 250s/150s w/ active epistaxis  Initially requiring nicardipine infusion. -Currently on amlodipine 10 mg daily, carvedilol dose was increased to 37.5 mg twice daily and losartan was increased to 25 twice daily -Monitor blood pressure

## 2023-02-20 ENCOUNTER — Inpatient Hospital Stay (HOSPITAL_COMMUNITY): Payer: 59

## 2023-02-20 DIAGNOSIS — I421 Obstructive hypertrophic cardiomyopathy: Secondary | ICD-10-CM

## 2023-02-20 DIAGNOSIS — I160 Hypertensive urgency: Secondary | ICD-10-CM | POA: Diagnosis not present

## 2023-02-20 DIAGNOSIS — I422 Other hypertrophic cardiomyopathy: Secondary | ICD-10-CM | POA: Diagnosis not present

## 2023-02-20 DIAGNOSIS — R040 Epistaxis: Secondary | ICD-10-CM | POA: Diagnosis not present

## 2023-02-20 DIAGNOSIS — R55 Syncope and collapse: Secondary | ICD-10-CM | POA: Diagnosis not present

## 2023-02-20 MED ORDER — CARVEDILOL 6.25 MG PO TABS
37.5000 mg | ORAL_TABLET | Freq: Two times a day (BID) | ORAL | Status: DC
  Administered 2023-02-20 – 2023-02-21 (×2): 37.5 mg via ORAL
  Filled 2023-02-20: qty 1
  Filled 2023-02-20: qty 2

## 2023-02-20 MED ORDER — LOSARTAN POTASSIUM 25 MG PO TABS
25.0000 mg | ORAL_TABLET | Freq: Two times a day (BID) | ORAL | Status: DC
  Administered 2023-02-20 – 2023-02-21 (×2): 25 mg via ORAL
  Filled 2023-02-20 (×2): qty 1

## 2023-02-20 MED ORDER — GADOBUTROL 1 MMOL/ML IV SOLN
11.0000 mL | Freq: Once | INTRAVENOUS | Status: AC | PRN
  Administered 2023-02-20: 11 mL via INTRAVENOUS

## 2023-02-20 MED ORDER — DIPHENHYDRAMINE HCL 25 MG PO CAPS
25.0000 mg | ORAL_CAPSULE | Freq: Once | ORAL | Status: AC
  Administered 2023-02-20: 25 mg via ORAL
  Filled 2023-02-20: qty 1

## 2023-02-20 NOTE — Progress Notes (Signed)
Report called to RN on 1C, vs wnl, no distress at the time of transfer.

## 2023-02-20 NOTE — TOC Initial Note (Signed)
Transition of Care [facility]) - Initial/Assessment Note    Patient Details  Name: Edwin Green MRN: [id number] Date of Birth: 03-29-1961  [facility] CM/SW Contact:    Tiburcio Bash, LCSW Phone Number: 02/20/2023, 3:45 PM  Clinical Narrative:                  CSW spoke with patient at bedside per RN request as patient was interested in disability.   Patient reports he was interested in short term disability, reports he is employed through [employer]. CSW encouraged him to reach out to his HR department at work to get short term disability through his employment. If he is interested in more longer term disability he can contact DSS. Patient expressed understanding, reports no needs a this time. States he lives at home with his sons who provide support as needed.   Expected Discharge Plan: Home/Self Care Barriers to Discharge: Continued Medical Work up   Patient Goals and CMS Choice Patient states their goals for this hospitalization and ongoing recovery are:: to go home CMS Medicare.gov Compare Post Acute Care list provided to:: Patient Choice offered to / list presented to : Patient      Expected Discharge Plan and Services                                              Prior Living Arrangements/Services       Do you feel safe going back to the place where you live?: Yes               Activities of Daily Living Home Assistive Devices/Equipment: None ADL Screening (condition at time of admission) Patient's cognitive ability adequate to safely complete daily activities?: Yes Is the patient deaf or have difficulty hearing?: No Does the patient have difficulty seeing, even when wearing glasses/contacts?: No Does the patient have difficulty concentrating, remembering, or making decisions?: No Patient able to express need for assistance with ADLs?: Yes Does the patient have difficulty dressing or bathing?: No Independently performs ADLs?: Yes  (appropriate for developmental age) Does the patient have difficulty walking or climbing stairs?: No Weakness of Legs: None Weakness of Arms/Hands: None  Permission Sought/Granted                  Emotional Assessment              Admission diagnosis:  Epistaxis [R04.0] Patient Active Problem List   Diagnosis Date Noted   LVH (left ventricular hypertrophy) 02/19/2023   CKD (chronic kidney disease) stage 3, GFR 30-59 ml/min ([place]) 02/18/2023   Epistaxis 02/17/2023   Alcohol use 02/17/2023   Hypertrophic cardiomyopathy ([place])    Syncope 06/26/2019   Chest pain 06/26/2019   AKI (acute kidney injury) ([place]) 06/26/2019   Malignant hypertension 06/24/2019   Nosebleed 06/24/2019   Hypertensive urgency 06/24/2019   PCP:  Merryl Hacker, No Pharmacy:   [facility] [place] [address] Phone: [phone] Fax: [phone]     Social Determinants of Health (SDOH) Social History: SDOH Screenings   Food Insecurity: No Food Insecurity (02/17/2023)  Housing: Low Risk  (02/17/2023)  Transportation Needs: No Transportation Needs (02/17/2023)  Utilities: Not At Risk (02/17/2023)  Tobacco Use: High Risk (02/17/2023)   SDOH Interventions:     Readmission Risk Interventions     No data  to display

## 2023-02-20 NOTE — Progress Notes (Addendum)
   Patient Name: Edwin Green Date of Encounter: 02/20/2023 [place] Cardiologist: Kathlyn Sacramento, MD   Interval Summary   Patient seen on am rounds. Denies any chest pain of shortness of breath. Packing had been removed from the left nostril and the right side has been deflated without reoccurrence of bleeding. Remains off of nicardipine drip since 02/18/23 with improving blood pressures with changes to oral medications. Kidney function has remained stable.  Vital Signs   Vitals:   02/20/23 0900 02/20/23 1000 02/20/23 1100 02/20/23 1300  BP: (!) 145/123 (!) 140/89 (!) 140/89 (!) 152/96  Pulse: 84 70 62 68  Resp: '20 13 18 15  '$ Temp:    98.3 F (36.8 C)  TempSrc:    Oral  SpO2: 98% 96% 98% 98%  Weight:      Height:        Intake/Output Summary (Last 24 hours) at 02/20/2023 1401 Last data filed at 02/20/2023 1300 Gross per 24 hour  Intake 1680 ml  Output 1775 ml  Net -95 ml      02/17/2023    2:19 PM 08/05/2019   11:16 AM 06/26/2019   11:07 PM  Last 3 Weights  Weight (lbs) 195 lb 1.7 oz 207 lb 193 lb 6.4 oz  Weight (kg) 88.5 kg 93.895 kg 87.726 kg      Telemetry/ECG     Sinus with rates in the 70's- Personally Reviewed  Physical Exam  GEN: No acute distress.  Nasal rocket noted.  Neck: No JVD Cardiac: RRR, no murmurs, rubs, or gallops.  Respiratory: Clear to auscultation bilaterally.Respirations remain unlabored at rest on room air GI: Soft, nontender, non-distended  MS: No edema  Assessment & Plan    Pre-syncope -Orthostatics were positive -History of HCM and syncope in 2020 felt to be due to dehydration, heart monitor and CMR were ordered but not completed, patient has been referred to EP but was lost to follow-up -Echocardiogram this admission revealed LVEF 60 to 65%, no regional wall motion abnormality, severe LVH, no LVOT obstruction, G1 DD, normal RV SF -Has been remaining in sinus on telemetry monitoring -Heart monitor at  discharge  Hypertension -Longstanding history of uncontrolled hypertension who has been noncompliant with medications in the past -Renal ultrasound negative for evidence of hemodynamically significant peripheral arterial disease and normal sonographic appearance of the kidneys -Blood pressure 140/89 -Has been off nicardipine drip since 02/18/23 -Continued on amlodipine, carvedilol, losartan -Vital signs per unit protocol  Hypertrophic cardiomyopathy -Previously failed on echocardiogram in 2027 for syncopal episode -Previous echocardiogram revealed LVEF 60-65%, no regional motion abnormalities, severe LVH, interventricular septum 2.3 cm and posterior wall 1.9 cm.  No LVOT or SAM, G1 DD -Recommend continuing to avoid diuretics or vasodilators -scheduled for cMRI today at noon -Will need further workup as outpatient  Epistaxis -No further bleeding noted -Hemoglobin has remained stable -Continues to be followed by ENT  Acute kidney injury -Serum creatinine of 1.88, continues to improve -Daily BMP -Monitor urine output -Monitor/trend/replete electrolytes as needed -Avoid nephrotoxic agents were able  Alcohol use -Drinks daily -CIWA per IM    For questions or updates, please contact [place] Please consult [email or web address] for contact info under        Signed, Tyvion Edmondson, NP

## 2023-02-20 NOTE — Progress Notes (Signed)
Progress Note   Patient: Edwin Green [id number] DOB: Apr 02, 1961 DOA: 02/17/2023     2 DOS: the patient was seen and examined on 02/20/2023   Brief hospital course: Taken from H&P.   Edwin Green is a 62 y.o. male with medical history significant of stage II CKD, epistaxis, hypertension, hypertrophic cardiomyopathy presenting with epistaxis, syncope, hypertensive urgency, alcohol use, AKI.  Patient reports sudden onset of nosebleeding over the past 3 to 4 days.  Patient was not taking his medications. for. No fevers or chills. No nausea or vomiting. No reported chest pain. Has had minimal to mild nosebleeds in the past that have been fairly well-controlled. 1/4 pack/day smoker. Drinks at least 2 large beers daily. Denies any other illicit drug use.   ED course and data reviewed.  On presentation patient has significantly elevated blood pressure in the 260s over 150s.  Bilateral nares were packed with noted subsequent episode of syncope/vasovagal event.  Symptoms resolved.  ENT was consulted and recommending slowly deflating left balloon once blood pressure has been improved, recommending keeping right balloon for 5 days.  Patient initially received IV labetalol boluses with some transient improvement in blood pressure and subsequent increase.  He was transferred to stepdown on Cardene infusion overnight.  Cardiology was also consulted.  3/5: Blood pressure still elevated at 169/105, remained on Cardene infusion.  UDS positive for cannabinoid. CBC with decrease of hemoglobin to 10 from 12.6 on admission, platelets decreased to 119 from 176.  Creatinine at 1.84, no recent comparison has lost creatinine shown in his chart was 1.39 in 2020. Echo done yesterday with normal EF, no regional wall motion abnormalities.  Severe eccentric left ventricular hypertrophy.  No evidence of LVOT obstruction or SAM.  Grade 1 diastolic dysfunction.  Dilated left atrium, mildly dilated aortic root. Patient has  positive orthostatic vitals so nicardipine infusion was discontinued by cardiology, they are starting low-dose amlodipine and carvedilol at 25 mg twice daily which can be increased to 50 mg twice daily for target resting heart rate of 60 and blood pressure should be less than 140/90.  Patient will need outpatient cardiac MRI for further evaluation of hypertrophic cardiomyopathy versus hypertensive hypertrophy due to his history of prolonged uncontrolled hypertension and not taking any medications.  Cardiology might consider Zio patch on discharge.  3/6: Blood pressure is still elevated at 165/95.  Renal function seems stable.  Calculated Fina was 2.1% which is consistent with intrinsic renal disease, most likely he has CKD secondary to uncontrolled hypertension.  Patient was started on low-dose losartan for better control of blood pressure.  Renal venous Doppler with normal kidneys and no significant arterial disease. Cardiac MRI planned for tomorrow  3/7; blood pressure still elevated but slowly improving, at 159/105 this morning.  Cardiac MRI today.  His left nasal packing was removed by ENT and partially deflated right balloon with a plan to remove in 2 days.  Cardiology would like to make some more changes to his antihypertensives and hopefully discharge tomorrow.    Assessment and Plan: * Epistaxis Severe epistaxis requiring bilateral nasal packing in setting of uncontrolled hypertension hypertensive urgency Bleeding now stabilized status post bilateral nasal packing ENT remove left packing yesterday and partially deflated right balloon and hopefully that will be removed tomorrow Titrate BP  Patient need to follow-up with ENT as an outpatient  Syncope Noted significant syncopal episode in ER  Suspect secondary to  vasovagal event with bilateral nasal packing for epistaxis and hypertensive urgency Patient now back  to baseline Patient has positive orthostatic vitals, baseline blood pressure  significantly elevated.  Echocardiogram with hypertrophic cardiomyopathy which can be due to uncontrolled hypertension.   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 Creatinine 1.87>>1.84 on presentation.  Patient with no follow-up after 2020 when he was presented with concern of AKI.  Most likely have CKD due to uncontrolled hypertension.  Fina of 2.1% which makes it intrinsic renal disease. Renal arterial duplex was negative for any significant stenosis, normal kidneys. 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 -Monitor renal function -Avoid nephrotoxins   Hypertrophic cardiomyopathy ([place]) Noted severe hypertrophic cardiomyopathy on 2D echo July 2020 Has had no follow-up or on appropriate antihypertensive medication Repeat echocardiogram with similar findings.  Cardiology was consulted and they are now planning cardiac MRI tomorrow for further characterization of hypertrophic cardiomyopathy versus cardiac hypertrophy secondary to uncontrolled hypertension. -Continue with blood pressure control-goal is less than 140/90 -Carvedilol dose increased to 37.5 mg twice daily -Losartan increased to 25 mg twice daily -Cardiac MRI done today-pending results  HTN (hypertension) Improving Accelerated hypertension presentation Baseline uncontrolled HTN  BP initially 250s/150s w/ active epistaxis  Initially requiring nicardipine infusion. -Currently on amlodipine 10 mg daily, carvedilol dose was increased to 37.5 mg twice daily and losartan was increased to 25 twice daily -Monitor blood pressure  Alcohol use Patient admits to drinking at least 2 large beers daily No reported hard liquor use.  UDS positive for cannabinoid. Folate levels within lower normal limit. B1 levels pending. -Continue with supplement -Continue with CIWA protocol   Subjective: Patient with no new concerns today.  Physical  Exam: Vitals:   02/20/23 0900 02/20/23 1000 02/20/23 1100 02/20/23 1300  BP: (!) 145/123 (!) 140/89 (!) 140/89 (!) 152/96  Pulse: 84 70 62 68  Resp: '20 13 18 15  '$ Temp:    98.3 F (36.8 C)  TempSrc:    Oral  SpO2: 98% 96% 98% 98%  Weight:      Height:       General.  Well-developed gentleman, in no acute distress. Pulmonary.  Lungs clear bilaterally, normal respiratory effort. CV.  Regular rate and rhythm, no JVD, rub or murmur. Abdomen.  Soft, nontender, nondistended, BS positive. CNS.  Alert and oriented .  No focal neurologic deficit. Extremities.  No edema, no cyanosis, pulses intact and symmetrical. Psychiatry.  Judgment and insight appears normal.    Data Reviewed: Prior data reviewed  Family Communication: .  Disposition: Status is: Inpatient Remains inpatient appropriate because: Severity of illness  Planned Discharge Destination: Home   Time spent: 40 minutes  This record has been created using Systems analyst. Errors have been sought and corrected,but may not always be located. Such creation errors do not reflect on the standard of care.   Author: Lorella Nimrod, MD 02/20/2023 2:18 PM  For on call review [email or web address].

## 2023-02-20 NOTE — Progress Notes (Signed)
Sharion Settler NP notified of patient's BP 156/107 and 150/105

## 2023-02-21 ENCOUNTER — Other Ambulatory Visit: Payer: Self-pay

## 2023-02-21 DIAGNOSIS — I517 Cardiomegaly: Secondary | ICD-10-CM

## 2023-02-21 DIAGNOSIS — N1832 Chronic kidney disease, stage 3b: Secondary | ICD-10-CM

## 2023-02-21 DIAGNOSIS — Z789 Other specified health status: Secondary | ICD-10-CM

## 2023-02-21 MED ORDER — HYDRALAZINE HCL 50 MG PO TABS
50.0000 mg | ORAL_TABLET | Freq: Three times a day (TID) | ORAL | 1 refills | Status: DC
  Filled 2023-02-21: qty 90, 30d supply, fill #0

## 2023-02-21 MED ORDER — LOSARTAN POTASSIUM 50 MG PO TABS: 100.0000 mg | ORAL_TABLET | Freq: Every day | ORAL | Status: DC

## 2023-02-21 MED ORDER — LOSARTAN POTASSIUM 50 MG PO TABS: 50.0000 mg | ORAL_TABLET | Freq: Every day | ORAL | Status: DC

## 2023-02-21 MED ORDER — HYDRALAZINE HCL 50 MG PO TABS
50.0000 mg | ORAL_TABLET | Freq: Three times a day (TID) | ORAL | Status: DC
  Administered 2023-02-21: 50 mg via ORAL
  Filled 2023-02-21: qty 1

## 2023-02-21 MED ORDER — NICOTINE 7 MG/24HR TD PT24
7.0000 mg | MEDICATED_PATCH | Freq: Every day | TRANSDERMAL | 0 refills | Status: DC
  Filled 2023-02-21: qty 14, 14d supply, fill #0

## 2023-02-21 MED ORDER — LOSARTAN POTASSIUM 50 MG PO TABS
50.0000 mg | ORAL_TABLET | Freq: Every day | ORAL | 1 refills | Status: DC
  Filled 2023-02-21: qty 90, 90d supply, fill #0

## 2023-02-21 MED ORDER — HYDRALAZINE HCL 50 MG PO TABS: 25.0000 mg | ORAL_TABLET | Freq: Three times a day (TID) | ORAL | Status: DC

## 2023-02-21 MED ORDER — AMLODIPINE BESYLATE 10 MG PO TABS
10.0000 mg | ORAL_TABLET | Freq: Every day | ORAL | 1 refills | Status: DC
  Filled 2023-02-21: qty 90, 90d supply, fill #0

## 2023-02-21 MED ORDER — CARVEDILOL 12.5 MG PO TABS
37.5000 mg | ORAL_TABLET | Freq: Two times a day (BID) | ORAL | 3 refills | Status: DC
  Filled 2023-02-21: qty 180, 30d supply, fill #0

## 2023-02-21 NOTE — Discharge Summary (Signed)
Physician Discharge Summary   Patient: Edwin Green MRN: [id number] DOB: [birth year]  Admit date:     02/17/2023  Discharge date: 02/21/23  Discharge Physician: Lorella Nimrod   PCP: Pcp, No   Recommendations at discharge:  Please obtain CBC and BMP in 1 week Follow-up with primary care provider within a week Follow-up with cardiology Follow-up with nephrology  Discharge Diagnoses: Principal Problem:   Epistaxis Active Problems:   Syncope   AKI (acute kidney injury) ([place])   Hypertrophic cardiomyopathy ([place])   Malignant hypertension   Alcohol use   LVH (left ventricular hypertrophy)   Hospital Course: Taken from H&P.   Edwin Green is a 62 y.o. male with medical history significant of stage II CKD, epistaxis, hypertension, hypertrophic cardiomyopathy presenting with epistaxis, syncope, hypertensive urgency, alcohol use, AKI.  Patient reports sudden onset of nosebleeding over the past 3 to 4 days.  Patient was not taking his medications. for. No fevers or chills. No nausea or vomiting. No reported chest pain. Has had minimal to mild nosebleeds in the past that have been fairly well-controlled. 1/4 pack/day smoker. Drinks at least 2 large beers daily. Denies any other illicit drug use.   ED course and data reviewed.  On presentation patient has significantly elevated blood pressure in the 260s over 150s.  Bilateral nares were packed with noted subsequent episode of syncope/vasovagal event.  Symptoms resolved.  ENT was consulted and recommending slowly deflating left balloon once blood pressure has been improved, recommending keeping right balloon for 5 days.  Patient initially received IV labetalol boluses with some transient improvement in blood pressure and subsequent increase.  He was transferred to stepdown on Cardene infusion overnight.  Cardiology was also consulted.  3/5: Blood pressure still elevated at 169/105, remained on Cardene infusion.  UDS positive for  cannabinoid. CBC with decrease of hemoglobin to 10 from 12.6 on admission, platelets decreased to 119 from 176.  Creatinine at 1.84, no recent comparison has lost creatinine shown in his chart was 1.39 in 2020. Echo done yesterday with normal EF, no regional wall motion abnormalities.  Severe eccentric left ventricular hypertrophy.  No evidence of LVOT obstruction or SAM.  Grade 1 diastolic dysfunction.  Dilated left atrium, mildly dilated aortic root. Patient has positive orthostatic vitals so nicardipine infusion was discontinued by cardiology, they are starting low-dose amlodipine and carvedilol at 25 mg twice daily which can be increased to 50 mg twice daily for target resting heart rate of 60 and blood pressure should be less than 140/90.  Patient will need outpatient cardiac MRI for further evaluation of hypertrophic cardiomyopathy versus hypertensive hypertrophy due to his history of prolonged uncontrolled hypertension and not taking any medications.  Cardiology might consider Zio patch on discharge.  3/6: Blood pressure is still elevated at 165/95.  Renal function seems stable.  Calculated Fina was 2.1% which is consistent with intrinsic renal disease, most likely he has CKD secondary to uncontrolled hypertension.  Patient was started on low-dose losartan for better control of blood pressure.  Renal venous Doppler with normal kidneys and no significant arterial disease. Cardiac MRI planned for tomorrow  3/7; blood pressure still elevated but slowly improving, at 159/105 this morning.  Cardiac MRI today.  His left nasal packing was removed by ENT and partially deflated right balloon with a plan to remove in 2 days.  Cardiology would like to make some more changes to his antihypertensives and hopefully discharge tomorrow.  3/8: Hemodynamically stable.  Blood pressure mildly elevated at 154/108  this morning but improved up to 136/69 over the past 24 hours after taking medications.  Cardiology also  added hydralazine 50 mg 3 times daily. Cardiac MRI consistent with HCM. ENT removed the second packing and there was no more concern of bleeding. Patient can use saline drops to help keep that area moist. He was counseled again for better control of blood pressure and stop smoking and drinking.  Patient is being discharged on amlodipine, losartan, carvedilol and hydralazine and need to have a close follow-up appointment with PCP and cardiology for further recommendations.  Patient will also need a follow-up with nephrology as an outpatient for concern of CKD stage IIIb.  Patient will continue on current medications.  Discussed with son on phone.  Assessment and Plan: * Epistaxis Severe epistaxis requiring bilateral nasal packing in setting of uncontrolled hypertension hypertensive urgency Bleeding now stabilized status post bilateral nasal packing ENT remove left packing yesterday and partially deflated right balloon and hopefully that will be removed tomorrow Titrate BP  Patient need to follow-up with ENT as an outpatient  Syncope Noted significant syncopal episode in ER  Suspect secondary to  vasovagal event with bilateral nasal packing for epistaxis and hypertensive urgency Patient now back to baseline Patient has positive orthostatic vitals, baseline blood pressure significantly elevated.  Echocardiogram with hypertrophic cardiomyopathy which can be due to uncontrolled hypertension.   -Patient might need ZIO monitor on discharge -Cardiac MRI today -Continue to monitor  AKI (acute kidney injury) ([place]) Versus CKD stage IIIb. Creatinine 1.87>>1.84 on presentation.  Patient with no follow-up after 2020 when he was presented with concern of AKI.  Most likely have CKD due to uncontrolled hypertension.  Fina of 2.1% which makes it intrinsic renal disease. Renal arterial duplex was negative for any significant stenosis, normal kidneys. 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 -Monitor  renal function -Avoid nephrotoxins   Hypertrophic cardiomyopathy ([place]) Noted severe hypertrophic cardiomyopathy on 2D echo July 2020 Has had no follow-up or on appropriate antihypertensive medication Repeat echocardiogram with similar findings.  Cardiology was consulted and they are now planning cardiac MRI tomorrow for further characterization of hypertrophic cardiomyopathy versus cardiac hypertrophy secondary to uncontrolled hypertension. -Continue with blood pressure control-goal is less than 140/90 -Carvedilol dose increased to 37.5 mg twice daily -Losartan increased to 25 mg twice daily -Cardiac MRI done today-pending results  Malignant hypertension Improving Accelerated hypertension presentation Baseline uncontrolled HTN  BP initially 250s/150s w/ active epistaxis  Initially requiring nicardipine infusion. -Currently on amlodipine 10 mg daily, carvedilol dose was increased to 37.5 mg twice daily and losartan was increased to 25 twice daily -Monitor blood pressure  Alcohol use Patient admits to drinking at least 2 large beers daily No reported hard liquor use.  UDS positive for cannabinoid. Folate levels within lower normal limit. B1 levels pending. -Continue with supplement -Continue with CIWA protocol   Consultants: Cardiology, nephrology, ENT Procedures performed: Bilateral nasal packing Disposition: Home Diet recommendation:  Discharge Diet Orders (From admission, onward)     Start     Ordered   02/21/23 0000  Diet - low sodium heart healthy        02/21/23 1336           Cardiac diet DISCHARGE MEDICATION: Allergies as of 02/21/2023   No Known Allergies      Medication List     STOP taking these medications    verapamil 120 MG tablet Commonly known as: CALAN       TAKE these medications  amLODipine 10 MG tablet Commonly known as: NORVASC Take 1 tablet (10 mg total) by mouth daily. Start taking on: February 22, 2023   carvedilol 12.5 MG  tablet Commonly known as: COREG Take 3 tablets (37.5 mg total) by mouth 2 (two) times daily with a meal.   hydrALAZINE 50 MG tablet Commonly known as: APRESOLINE Take 1 tablet (50 mg total) by mouth 3 (three) times daily.   losartan 50 MG tablet Commonly known as: COZAAR Take 1 tablet (50 mg total) by mouth daily. Start taking on: February 22, 2023 What changed:  medication strength how much to take   nicotine 7 mg/24hr patch Commonly known as: NICODERM CQ - dosed in mg/24 hr Place 1 patch (7 mg total) onto the skin daily. Start taking on: February 22, 2023        Follow-up Information     Wellington Hampshire, MD. Schedule an appointment as soon as possible for a visit in 1 week(s).   Specialty: Cardiology Contact information: [place] [address] [phone]         Lavonia Dana, MD. Schedule an appointment as soon as possible for a visit in 1 week(s).   Specialty: Nephrology Contact information: [address] [place] [address] [phone]                Discharge Exam: Danley Danker Weights   02/17/23 1419  Weight: 88.5 kg   General.     In no acute distress. Pulmonary.  Lungs clear bilaterally, normal respiratory effort. CV.  Regular rate and rhythm, no JVD, rub or murmur. Abdomen.  Soft, nontender, nondistended, BS positive. CNS.  Alert and oriented .  No focal neurologic deficit. Extremities.  No edema, no cyanosis, pulses intact and symmetrical. Psychiatry.  Judgment and insight appears normal.   Condition at discharge: stable  The results of significant diagnostics from this hospitalization (including imaging, microbiology, ancillary and laboratory) are listed below for reference.   Imaging Studies: MR CARDIAC MORPHOLOGY W WO CONTRAST  Result Date: 02/20/2023 CLINICAL DATA:  LVH, evaluate for HCM, hx of syncope. EXAM: CARDIAC MRI TECHNIQUE: The patient was scanned on a 1.5 Tesla Siemens magnet. A  dedicated cardiac coil was used. Functional imaging was done using Fiesta sequences. 2,3, and 4 chamber views were done to assess for RWMA's. Modified Simpson's rule using a short axis stack was used to calculate an ejection fraction on a dedicated work Conservation officer, nature. The patient received 11 cc of Gadavist. After 10 minutes inversion recovery sequences were used to assess for infiltration and scar tissue. Velocity flow mapping performed in the ascending aorta and main pulmonary artery. CONTRAST:  11 cc  of Gadavist FINDINGS: 1. Normal left ventricular size and systolic function (LVEF = 68%). There are no regional wall motion abnormalities. There is severe asymmetric left ventricular hypertrophy. LV septal wall measures upto 5m in thickness. Mild LV lateral wall hypertrophy. There is systolic anterior motion of mitral valve causing LVOT obstruction. There is no late gadolinium enhancement in the left ventricular myocardium. LVEDV: 140 ml LVESV: 45 ml SV: 96 ml CO: 6.6 L/min Myocardial mass: 236 g 2. Normal right ventricular size, thickness and systolic function (RVEF = 55%). There are no regional wall motion abnormalities. 3.  Normal left and right atrial size. 4. Normal size of the aortic root, ascending aorta and pulmonary artery. 5.  No significant valvular abnormalities. 6.  Normal pericardium.  No pericardial effusion. IMPRESSION: 1.  Normal LV function, LVEF = 68% 2. Severe asymmetric left ventricular hypertrophy. LV septal wall measures upto 74m in thickness. 3. There is systolic anterior motion of mitral valve causing LVOT obstruction. 4. Mild LV lateral wall hypertrophy. 5. There is no late gadolinium enhancement in the left ventricular myocardium. 6.  Normal RV size and function. 7. Findings consistent with Hypertrophic Obstructive Cardiomyopathy. [facility]. Electronically Signed   By: BKate SableM.D.   On: 02/20/2023 14:22   [place]NAL ARTERY DUPLEX  COMPLETE  Result Date: 02/19/2023 CLINICAL DATA:  Hypertension EXAM: RENAL/URINARY TRACT ULTRASOUND RENAL DUPLEX DOPPLER ULTRASOUND COMPARISON:  None Available. FINDINGS: Right Kidney: Length: 9.6. Echogenicity within normal limits. No mass or hydronephrosis visualized. Left Kidney: Length: 10.2. Echogenicity within normal limits. No mass or hydronephrosis visualized. Bladder:  Within normal limits. RENAL DUPLEX ULTRASOUND Right Renal Artery Velocities: Origin:  31.5 cm/sec Mid:  75 cm/sec Hilum:  61 cm/sec Interlobar:  47 cm/sec Arcuate:  65 cm/sec Left Renal Artery Velocities: Origin:  74 cm/sec Mid:  106 cm/sec Hilum:  104 cm/sec Interlobar:  64 cm/sec Arcuate:  47 cm/sec Aortic Velocity:  97 cm/sec Right Renal-Aortic Ratios: Origin: 0.3 Mid:  1.3 Hilum: 1.6 Interlobar: 1.7 Arcuate: 1.5 Left Renal-Aortic Ratios: Origin: 1.3 Mid: 0.9 Hilum: 0.9 Interlobar: 1.5 Arcuate: 2.1 IMPRESSION: Negative for evidence of hemodynamically significant peripheral arterial disease. Normal sonographic appearance of the kidneys. Signed, HCriselda Peaches MD, RSim[facility] Electronically Signed   By: HJacqulynn CadetM.D.   On: 02/19/2023 13:41   ECHOCARDIOGRAM COMPLETE  Result Date: 02/17/2023    ECHOCARDIOGRAM REPORT   Patient Name:   DOLAJUWON BENDDate of Exam: 02/17/2023 Medical Rec #:  [id number]      Height:       73.0 in Accession #:    [id number]     Weight:       207.0 lb Date of Birth:  103-25-1962      BSA:          2.183 m Patient Age:    89years        BP:           199/126 mmHg Patient Gender: M               HR:           70 bpm. Exam Location:  [facility] Procedure: 2D Echo, Cardiac Doppler and Color Doppler Indications:     Hypertrophic obstructive cardiomyopathy  History:         Patient has prior history of Echocardiogram examinations, most                  recent 06/27/2019. Hypertrophic Cardiomyopathy,                  Signs/Symptoms:Chest Pain and  Syncope; Risk                  Factors:Hypertension.  Sonographer:     DWenda LowReferring Phys:  3[place] LorenzoDiagnosing Phys: MKathlyn SacramentoMD IMPRESSIONS  1. Left ventricular ejection fraction, by estimation, is 60 to 65%. The left ventricle has normal function. The left ventricle has no regional wall motion abnormalities. There is severe eccentric left ventricular hypertrophy. Intravventriuclar septum measures 2.3 cm and posterior wall 1.9 cm. No evidence of LVOT obstruction or SAM. Left ventricular diastolic parameters are consistent with Grade I diastolic dysfunction (impaired relaxation).  2. Right ventricular systolic function is normal. The right ventricular size is normal. Tricuspid regurgitation signal is inadequate for assessing PA pressure.  3. Left atrial size was moderately dilated.  4. The mitral valve is normal in structure. No evidence of mitral valve regurgitation. No evidence of mitral stenosis.  5. The aortic valve is normal in structure. Aortic valve regurgitation is trivial. No aortic stenosis is present.  6. Aortic dilatation noted. There is mild dilatation of the aortic root, measuring 40 mm. FINDINGS  Left Ventricle: Left ventricular ejection fraction, by estimation, is 60 to 65%. The left ventricle has normal function. The left ventricle has no regional wall motion abnormalities. The left ventricular internal cavity size was normal in size. There is  severe eccentric left ventricular hypertrophy. Left ventricular diastolic parameters are consistent with Grade I diastolic dysfunction (impaired relaxation). Right Ventricle: The right ventricular size is normal. No increase in right ventricular wall thickness. Right ventricular systolic function is normal. Tricuspid regurgitation signal is inadequate for assessing PA pressure. Left Atrium: Left atrial size was moderately dilated. Right Atrium: Right atrial size was normal in size. Pericardium: There is no evidence of  pericardial effusion. Mitral Valve: The mitral valve is normal in structure. No evidence of mitral valve regurgitation. No evidence of mitral valve stenosis. MV peak gradient, 4.8 mmHg. The mean mitral valve gradient is 1.0 mmHg. Tricuspid Valve: The tricuspid valve is normal in structure. Tricuspid valve regurgitation is not demonstrated. No evidence of tricuspid stenosis. Aortic Valve: The aortic valve is normal in structure. Aortic valve regurgitation is trivial. No aortic stenosis is present. Aortic valve mean gradient measures 4.0 mmHg. Aortic valve peak gradient measures 6.0 mmHg. Aortic valve area, by VTI measures 2.84 cm. Pulmonic Valve: The pulmonic valve was normal in structure. Pulmonic valve regurgitation is not visualized. No evidence of pulmonic stenosis. Aorta: Aortic dilatation noted. There is mild dilatation of the aortic root, measuring 40 mm. Venous: The inferior vena cava was not well visualized. IAS/Shunts: No atrial level shunt detected by color flow Doppler.  LEFT VENTRICLE PLAX 2D LVIDd:         4.90 cm   Diastology LVIDs:         3.20 cm   LV e' medial:    3.81 cm/s LV PW:         1.90 cm   LV E/e' medial:  17.9 LV IVS:        2.30 cm   LV e' lateral:   3.37 cm/s LVOT diam:     2.10 cm   LV E/e' lateral: 20.2 LV SV:         77 LV SV Index:   35 LVOT Area:     3.46 cm  RIGHT VENTRICLE RV Basal diam:  3.45 cm RV Mid diam:    3.00 cm RV S prime:     14.70 cm/s LEFT ATRIUM            Index        RIGHT ATRIUM           Index LA diam:      5.00 cm  2.29 cm/m   RA Area:     15.00 cm LA Vol (A2C): 110.0 ml 50.38 ml/m  RA Volume:   28.70 ml  13.15 ml/m LA Vol (A4C): 65.1 ml  29.82 ml/m  AORTIC VALVE                    PULMONIC VALVE AV  Area (Vmax):    3.01 cm     PV Vmax:       0.82 m/s AV Area (Vmean):   3.05 cm     PV Peak grad:  2.7 mmHg AV Area (VTI):     2.84 cm AV Vmax:           122.00 cm/s AV Vmean:          88.400 cm/s AV VTI:            0.270 m AV Peak Grad:      6.0 mmHg AV Mean  Grad:      4.0 mmHg LVOT Vmax:         106.00 cm/s LVOT Vmean:        77.900 cm/s LVOT VTI:          0.221 m LVOT/AV VTI ratio: 0.82  AORTA Ao Root diam: 4.00 cm Ao Asc diam:  3.80 cm MITRAL VALVE MV Area (PHT): 3.92 cm    SHUNTS MV Area VTI:   2.83 cm    Systemic VTI:  0.22 m MV Peak grad:  4.8 mmHg    Systemic Diam: 2.10 cm MV Mean grad:  1.0 mmHg MV Vmax:       1.09 m/s MV Vmean:      48.7 cm/s MV Decel Time: 194 msec MV E velocity: 68.15 cm/s MV A velocity: 88.80 cm/s MV E/A ratio:  0.77 Kathlyn Sacramento MD Electronically signed by Kathlyn Sacramento MD Signature Date/Time: 02/17/2023/2:35:07 PM    Final     Microbiology: Results for orders placed or performed during the hospital encounter of 06/23/19  SARS Coronavirus 2 (CEPHEID - Performed in [facility] hospital lab), Hosp Order     Status: None   Collection Time: 06/24/19  1:17 AM   Specimen: Nasopharyngeal Swab  Result Value Ref Range Status   SARS Coronavirus 2 NEGATIVE NEGATIVE Final    Comment: (NOTE) If result is NEGATIVE SARS-CoV-2 target nucleic acids are NOT DETECTED. The SARS-CoV-2 RNA is generally detectable in upper and lower  respiratory specimens during the acute phase of infection. The lowest  concentration of SARS-CoV-2 viral copies this assay can detect is 250  copies / mL. A negative result does not preclude SARS-CoV-2 infection  and should not be used as the sole basis for treatment or other  patient management decisions.  A negative result may occur with  improper specimen collection / handling, submission of specimen other  than nasopharyngeal swab, presence of viral mutation(s) within the  areas targeted by this assay, and inadequate number of viral copies  (<250 copies / mL). A negative result must be combined with clinical  observations, patient history, and epidemiological information. If result is POSITIVE SARS-CoV-2 target nucleic acids are DETECTED. The SARS-CoV-2 RNA is generally detectable in upper and lower   respiratory specimens dur ing the acute phase of infection.  Positive  results are indicative of active infection with SARS-CoV-2.  Clinical  correlation with patient history and other diagnostic information is  necessary to determine patient infection status.  Positive results do  not rule out bacterial infection or co-infection with other viruses. If result is PRESUMPTIVE POSTIVE SARS-CoV-2 nucleic acids MAY BE PRESENT.   A presumptive positive result was obtained on the submitted specimen  and confirmed on repeat testing.  While 2019 novel coronavirus  (SARS-CoV-2) nucleic acids may be present in the submitted sample  additional confirmatory testing may be necessary for epidemiological  and / or  clinical management purposes  to differentiate between  SARS-CoV-2 and other Sarbecovirus currently known to infect humans.  If clinically indicated additional testing with an alternate test  methodology [phone]) is advised. The SARS-CoV-2 RNA is generally  detectable in upper and lower respiratory sp ecimens during the acute  phase of infection. The expected result is Negative. Fact Sheet for Patients:  StrictlyIdeas.no Fact Sheet for Healthcare Providers: BankingDealers.co.za This test is not yet approved or cleared by the [place] FDA and has been authorized for detection and/or diagnosis of SARS-CoV-2 by FDA under an Emergency Use Authorization (EUA).  This EUA will remain in effect (meaning this test can be used) for the duration of the COVID-19 declaration under Section 564(b)(1) of the Act, 21 U.S.C. section 360bbb-3(b)(1), unless the authorization is terminated or revoked sooner. Performed at [facility], [address] [place], [place] [address]   MRSA PCR Screening     Status: None   Collection Time: 06/24/19  4:17 AM   Specimen: Nasopharyngeal  Result Value Ref Range Status   MRSA by PCR NEGATIVE NEGATIVE Final     Comment:        The GeneXpert MRSA Assay (FDA approved for NASAL specimens only), is one component of a comprehensive MRSA colonization surveillance program. It is not intended to diagnose MRSA infection nor to guide or monitor treatment for MRSA infections. Performed at [facility], [address] [place], [place] [address]     Labs: CBC: Recent Labs  Lab 02/17/23 0716 02/18/23 0404 02/19/23 0556  WBC 4.1 5.0 4.6  NEUTROABS 2.0  --   --   HGB 12.6* 10.0* 10.2*  HCT 39.2 31.1* 30.7*  MCV 93.1 92.8 91.9  PLT 176 119* [id number]*   Basic Metabolic Panel: Recent Labs  Lab 02/17/23 0716 02/18/23 0404 02/19/23 0556  NA 137 138 136  K 4.0 3.8 4.1  CL 109 109 108  CO2 '22 22 23  '$ GLUCOSE 117* 120* 114*  BUN 30* 25* 20  CREATININE 1.87* 1.84* 1.88*  CALCIUM 8.1* 7.7* 8.0*  PHOS  --   --  2.1*   Liver Function Tests: Recent Labs  Lab 02/17/23 0716 02/18/23 0404 02/19/23 0556  AST 29 19  --   ALT 30 22  --   ALKPHOS 61 51  --   BILITOT 0.6 0.6  --   PROT 7.1 5.9*  --   ALBUMIN 3.8 3.2* 3.2*   CBG: Recent Labs  Lab 02/17/23 1419  GLUCAP 113*    Discharge time spent: greater than 30 minutes.  This record has been created using Systems analyst. Errors have been sought and corrected,but may not always be located. Such creation errors do not reflect on the standard of care.   Signed: Lorella Nimrod, MD [facility] 02/21/2023

## 2023-02-21 NOTE — Progress Notes (Signed)
Rounding Note    Patient Name: Edwin Green Date of Encounter: 02/21/2023  [place] Cardiologist: Kathlyn Sacramento, MD   Subjective   No further epistaxis Still has packing in place on the right Discussed medication adjustments with him, blood pressure much improved, he is in agreement Again today he is talking about needing more time to adjust to his blood pressure medications.  Thinks it will take more than 1 week Denies any significant symptoms from his blood pressure medications currently  Inpatient Medications    Scheduled Meds:  amLODipine  10 mg Oral Daily   carvedilol  37.5 mg Oral BID WC   Chlorhexidine Gluconate Cloth  6 each Topical Daily   hydrALAZINE  50 mg Oral TID   [START ON 02/22/2023] losartan  50 mg Oral Daily   nicotine  7 mg Transdermal Daily   Continuous Infusions:  PRN Meds: acetaminophen, ondansetron **OR** ondansetron (ZOFRAN) IV, mouth rinse   Vital Signs    Vitals:   02/20/23 2359 02/21/23 0449 02/21/23 0829 02/21/23 1324  BP: (!) 159/90 (!) 154/108 (!) 147/123 (!) 154/89  Pulse: 75 76 82 69  Resp: '18 18 20   '$ Temp: 98.5 F (36.9 C) 98.5 F (36.9 C) 98.6 F (37 C)   TempSrc:  Oral    SpO2: 99% 99% 100% 100%  Weight:      Height:        Intake/Output Summary (Last 24 hours) at 02/21/2023 1402 Last data filed at 02/21/2023 [id number] Gross per 24 hour  Intake 360 ml  Output 1000 ml  Net -640 ml      02/17/2023    2:19 PM 08/05/2019   11:16 AM 06/26/2019   11:07 PM  Last 3 Weights  Weight (lbs) 195 lb 1.7 oz 207 lb 193 lb 6.4 oz  Weight (kg) 88.5 kg 93.895 kg 87.726 kg      Telemetry    Normal sinus rhythm- Personally Reviewed  ECG     - Personally Reviewed  Physical Exam   GEN: No acute distress.   Neck: No JVD Cardiac: RRR, no murmurs, rubs, or gallops.  Respiratory: Clear to auscultation bilaterally. GI: Soft, nontender, non-distended  MS: No edema; No deformity. Neuro:  Nonfocal  Psych: Normal affect    Labs    High Sensitivity Troponin:  No results for input(s): "TROPONINIHS" in the last 720 hours.   Chemistry Recent Labs  Lab 02/17/23 0716 02/18/23 0404 02/19/23 0556  NA 137 138 136  K 4.0 3.8 4.1  CL 109 109 108  CO2 '22 22 23  '$ GLUCOSE 117* 120* 114*  BUN 30* 25* 20  CREATININE 1.87* 1.84* 1.88*  CALCIUM 8.1* 7.7* 8.0*  PROT 7.1 5.9*  --   ALBUMIN 3.8 3.2* 3.2*  AST 29 19  --   ALT 30 22  --   ALKPHOS 61 51  --   BILITOT 0.6 0.6  --   GFRNONAA 41* 42* 41*  ANIONGAP '6 7 5    '$ Lipids No results for input(s): "CHOL", "TRIG", "HDL", "LABVLDL", "LDLCALC", "CHOLHDL" in the last 168 hours.  Hematology Recent Labs  Lab 02/17/23 0716 02/18/23 0404 02/19/23 0556  WBC 4.1 5.0 4.6  RBC 4.21* 3.35* 3.34*  HGB 12.6* 10.0* 10.2*  HCT 39.2 31.1* 30.7*  MCV 93.1 92.8 91.9  MCH 29.9 29.9 30.5  MCHC 32.1 32.2 33.2  RDW 13.5 13.5 13.7  PLT 176 119* 147*   Thyroid No results for input(s): "TSH", "FREET4" in the last  168 hours.  BNPNo results for input(s): "BNP", "PROBNP" in the last 168 hours.  DDimer No results for input(s): "DDIMER" in the last 168 hours.   Radiology    MR CARDIAC MORPHOLOGY W WO CONTRAST  Result Date: 02/20/2023 CLINICAL DATA:  LVH, evaluate for HCM, hx of syncope. EXAM: CARDIAC MRI TECHNIQUE: The patient was scanned on a 1.5 Tesla Siemens magnet. A dedicated cardiac coil was used. Functional imaging was done using Fiesta sequences. 2,3, and 4 chamber views were done to assess for RWMA's. Modified Simpson's rule using a short axis stack was used to calculate an ejection fraction on a dedicated work Conservation officer, nature. The patient received 11 cc of Gadavist. After 10 minutes inversion recovery sequences were used to assess for infiltration and scar tissue. Velocity flow mapping performed in the ascending aorta and main pulmonary artery. CONTRAST:  11 cc  of Gadavist FINDINGS: 1. Normal left ventricular size and systolic function (LVEF = 68%). There are  no regional wall motion abnormalities. There is severe asymmetric left ventricular hypertrophy. LV septal wall measures upto 34m in thickness. Mild LV lateral wall hypertrophy. There is systolic anterior motion of mitral valve causing LVOT obstruction. There is no late gadolinium enhancement in the left ventricular myocardium. LVEDV: 140 ml LVESV: 45 ml SV: 96 ml CO: 6.6 L/min Myocardial mass: 236 g 2. Normal right ventricular size, thickness and systolic function (RVEF = 55%). There are no regional wall motion abnormalities. 3.  Normal left and right atrial size. 4. Normal size of the aortic root, ascending aorta and pulmonary artery. 5.  No significant valvular abnormalities. 6.  Normal pericardium.  No pericardial effusion. IMPRESSION: 1. Normal LV function, LVEF = 68% 2. Severe asymmetric left ventricular hypertrophy. LV septal wall measures upto 224min thickness. 3. There is systolic anterior motion of mitral valve causing LVOT obstruction. 4. Mild LV lateral wall hypertrophy. 5. There is no late gadolinium enhancement in the left ventricular myocardium. 6.  Normal RV size and function. 7. Findings consistent with Hypertrophic Obstructive Cardiomyopathy. [facility]. Electronically Signed   By: BrKate Sable.D.   On: 02/20/2023 14:22    Cardiac Studies  Echo  1. Left ventricular ejection fraction, by estimation, is 60 to 65%. The  left ventricle has normal function. The left ventricle has no regional  wall motion abnormalities. There is severe eccentric left ventricular  hypertrophy. Intravventriuclar septum  measures 2.3 cm and posterior wall 1.9 cm. No evidence of LVOT obstruction  or SAM. Left ventricular diastolic parameters are consistent with Grade I  diastolic dysfunction (impaired relaxation).   2. Right ventricular systolic function is normal. The right ventricular  size is normal. Tricuspid regurgitation signal is inadequate for assessing  PA pressure.   3.  Left atrial size was moderately dilated.   4. The mitral valve is normal in structure. No evidence of mitral valve  regurgitation. No evidence of mitral stenosis.   5. The aortic valve is normal in structure. Aortic valve regurgitation is  trivial. No aortic stenosis is present.   6. Aortic dilatation noted. There is mild dilatation of the aortic root,  measuring 40 mm.   Cardiac MRI   IMPRESSION: 1. Normal LV function, LVEF = 68%   2. Severe asymmetric left ventricular hypertrophy. LV septal wall measures upto 2277mn thickness.   3. There is systolic anterior motion of mitral valve causing LVOT obstruction.   4. Mild LV lateral wall hypertrophy.   5. There  is no late gadolinium enhancement in the left ventricular myocardium.   6.  Normal RV size and function.   7. Findings consistent with Hypertrophic Obstructive Cardiomyopathy. Consider referral to [facility].  Patient Profile     62 y.o. male  with a hx of CKD stage II, epistaxis, hypertension, hypertrophic cardiomyopathy who is being seen 02/17/2023 for the evaluation of syncope and hypertension.    Assessment & Plan    Epistaxis Followed by ENT, required packing Suspected to be exacerbated by malignant hypertension, initial blood pressure 244/160 Uncontrolled bleeding from right nostril -Blood pressure much improved, plan for removal of packing per ENT   Hypertrophic cardiomyopathy Severe  LVH noted on echocardiogram July 2020 with near cavity obliteration in systole Was lost to follow-up in cardiology clinic Reports that he stopped all of his blood pressure medications as he did not feel well Echocardiogram this admission again with severe  LVH, no left ventricular outflow tract gradient Cardiac MRI showing severe asymmetric LVH LV septum 22 mm, concern for Sam of mitral valve causing outflow tract gradient -Plan to refer him to HCM as an outpatient Again we have reiterated importance of taking his blood pressure  medications   Renal failure Serum creatinine 1.8, stable Likely secondary to chronically elevated and poorly controlled blood pressure -Renal ultrasound pending   Alcohol abuse Cessation recommended, notes indicating he drinks daily   Essential hypertension Markedly elevated blood pressure on arrival Off nicardipine infusion Continue amlodipine 10, carvedilol 37.5 twice daily, losartan up to 50 daily, hydralazine 50 3 times daily  Long discussion concerning discharge follow-up, need for medication compliance.  Discussed his concerns with getting disability Greater than 50% was spent in counseling and coordination of care with patient Total encounter time 50 minutes or more   For questions or updates, please contact [place] Please consult [email or web address] for contact info under        Signed, Ida Rogue, MD  02/21/2023, 2:02 PM

## 2023-02-21 NOTE — Consult Note (Signed)
[facility]  CONSULT NOTE    Date: 02/21/2023                  Patient Name:  Edwin Green  MRN: [id number]  DOB: 1961/11/21  Age / Sex: 62 y.o., male         PCP: Pcp, No                 Service Requesting Consult: [place]                 Reason for Consult: Acute kidney injury            History of Present Illness: Mr. Edwin Green is a 62 y.o.  male with past medical conditions including hypertension, hypertrophic cardiomyopathy,, who was admitted to [facility] on 02/17/2023 for Epistaxis [R04.0]  Patient reports to the emergency department complaining of nosebleeds.  Patient states his nose has been intermittently bleeding over the past 3 to 4 days at time of admission.  Patient states this is happened to him once in the past, about 4 years prior.  Denies regular use of NSAIDs.  States he normally gets a nosebleed when his blood pressure is high.  Reports being diagnosed with hypertension in his 68s, was given antihypertensive medication.  Patient admits to not following up with Dr., Cristi Loron taking medication when prescription ran out.  No family history of kidney disease.  Denies shortness of breath.  Denies painful or discomfort with urination.  Denies lower extremity edema.  Labs on ED arrival concerning for BUN 30, creatinine 1.87 with GFR 41, and hemoglobin 12.6.  Baseline unknown.  Renal artery duplex negative for hydronephrosis.  Medications: Outpatient medications: Medications Prior to Admission  Medication Sig Dispense Refill Last Dose   losartan (COZAAR) 25 MG tablet Take 1 tablet (25 mg total) by mouth daily. 90 tablet 3    verapamil (CALAN) 120 MG tablet Take 1.5 tablets (180 mg total) by mouth every 12 (twelve) hours. (Patient not taking: Reported on 02/17/2023) 67 tablet 3 Not Taking    Current medications: Current Facility-Administered Medications  Medication Dose Route Frequency Provider Last Rate Last Admin   acetaminophen (TYLENOL) tablet 650 mg   650 mg Oral Q6H PRN Deneise Lever, MD   650 mg at 02/21/23 0843   amLODipine (NORVASC) tablet 10 mg  10 mg Oral Daily Kathlyn Sacramento A, MD   10 mg at 02/21/23 0843   carvedilol (COREG) tablet 37.5 mg  37.5 mg Oral BID WC Minna Merritts, MD   37.5 mg at 02/21/23 [id number]   Chlorhexidine Gluconate Cloth 2 % PADS 6 each  6 each Topical Daily Deneise Lever, MD   6 each at 02/21/23 0843   hydrALAZINE (APRESOLINE) tablet 50 mg  50 mg Oral TID Minna Merritts, MD       [START ON 02/22/2023] losartan (COZAAR) tablet 50 mg  50 mg Oral Daily Gollan, Kathlene November, MD       nicotine (NICODERM CQ - dosed in mg/24 hr) patch 7 mg  7 mg Transdermal Daily Deneise Lever, MD   7 mg at 02/21/23 0843   ondansetron (ZOFRAN) tablet 4 mg  4 mg Oral Q6H PRN Deneise Lever, MD       Or   ondansetron [facility]) injection 4 mg  4 mg Intravenous Q6H PRN Deneise Lever, MD       Oral care mouth rinse  15 mL Mouth Rinse PRN Deneise Lever,  MD          Allergies: No Known Allergies    Past Medical History: Past Medical History:  Diagnosis Date   CKD (chronic kidney disease), stage II    Epistaxis    Hypertension    Hypertrophic cardiomyopathy ([place])    a. TTE 7/20 - 60 to 65%, normal LV cavity size, severe concentric LVH with near cavity obliteration in systole, diastolic dysfunction, normal RV systolic function, normal RV cavity size, dilated aortic root measuring 4 cm, no significant valvular abnormality     Past Surgical History: Past Surgical History:  Procedure Laterality Date   NO PAST SURGERIES       Family History: Family History  Problem Relation Age of Onset   Hypertension Mother    Stroke Father    Hypertension Father    Asthma Son      Social History: Social History   Socioeconomic History   Marital status: Single    Spouse name: Not on file   Number of children: Not on file   Years of education: Not on file   Highest education level: Not on file  Occupational History    Not on file  Tobacco Use   Smoking status: Some Days    Types: Cigarettes   Smokeless tobacco: Never  Substance and Sexual Activity   Alcohol use: Yes   Drug use: No   Sexual activity: Not on file  Other Topics Concern   Not on file  Social History Narrative   Not on file   Social Determinants of Health   Financial Resource Strain: Not on file  Food Insecurity: No Food Insecurity (02/17/2023)   Hunger Vital Sign    Worried About Running Out of Food in the Last Year: Never true    Ran Out of Food in the Last Year: Never true  Transportation Needs: No Transportation Needs (02/17/2023)   PRAPARE - Hydrologist (Medical): No    Lack of Transportation (Non-Medical): No  Physical Activity: Not on file  Stress: Not on file  Social Connections: Not on file  Intimate Partner Violence: Not At Risk (02/17/2023)   Humiliation, Afraid, Rape, and Kick questionnaire    Fear of Current or Ex-Partner: No    Emotionally Abused: No    Physically Abused: No    Sexually Abused: No     Review of Systems: Review of Systems  Constitutional:  Negative for chills, fever and malaise/fatigue.  HENT:  Positive for nosebleeds. Negative for congestion, sore throat and tinnitus.   Eyes:  Negative for blurred vision and redness.  Respiratory:  Negative for cough, shortness of breath and wheezing.   Cardiovascular:  Negative for chest pain, palpitations, claudication and leg swelling.  Gastrointestinal:  Negative for abdominal pain, blood in stool, diarrhea, nausea and vomiting.  Genitourinary:  Negative for flank pain, frequency and hematuria.  Musculoskeletal:  Negative for back pain, falls and myalgias.  Skin:  Negative for rash.  Neurological:  Negative for dizziness, weakness and headaches.  Endo/Heme/Allergies:  Does not bruise/bleed easily.  Psychiatric/Behavioral:  Negative for depression. The patient is not nervous/anxious and does not have insomnia.     Vital  Signs: Blood pressure (!) 147/123, pulse 82, temperature 98.6 F (37 C), resp. rate 20, height '6\' 1"'$  (1.854 m), weight 88.5 kg, SpO2 100 %.  Weight trends: Filed Weights   02/17/23 1419  Weight: 88.5 kg    Physical Exam: General: NAD  Head: Normocephalic, atraumatic. Moist  oral mucosal membranes  Eyes: Anicteric  Lungs:  Clear to auscultation normal effort  Heart: Regular rate and rhythm  Abdomen:  Soft, nontender,   Extremities: No peripheral edema.  Neurologic: Nonfocal, moving all four extremities  Skin: No lesions  Access: None     Lab results: Basic Metabolic Panel: Recent Labs  Lab 02/17/23 0716 02/18/23 0404 02/19/23 0556  NA 137 138 136  K 4.0 3.8 4.1  CL 109 109 108  CO2 '22 22 23  '$ GLUCOSE 117* 120* 114*  BUN 30* 25* 20  CREATININE 1.87* 1.84* 1.88*  CALCIUM 8.1* 7.7* 8.0*  PHOS  --   --  2.1*    Liver Function Tests: Recent Labs  Lab 02/17/23 0716 02/18/23 0404 02/19/23 0556  AST 29 19  --   ALT 30 22  --   ALKPHOS 61 51  --   BILITOT 0.6 0.6  --   PROT 7.1 5.9*  --   ALBUMIN 3.8 3.2* 3.2*   No results for input(s): "LIPASE", "AMYLASE" in the last 168 hours. No results for input(s): "AMMONIA" in the last 168 hours.  CBC: Recent Labs  Lab 02/17/23 0716 02/18/23 0404 02/19/23 0556  WBC 4.1 5.0 4.6  NEUTROABS 2.0  --   --   HGB 12.6* 10.0* 10.2*  HCT 39.2 31.1* 30.7*  MCV 93.1 92.8 91.9  PLT 176 119* 147*    Cardiac Enzymes: No results for input(s): "CKTOTAL", "CKMB", "CKMBINDEX", "TROPONINI" in the last 168 hours.  BNP: Invalid input(s): "POCBNP"  CBG: Recent Labs  Lab 02/17/23 [facility]*    Microbiology: Results for orders placed or performed during the hospital encounter of 06/23/19  SARS Coronavirus 2 (CEPHEID - Performed in [place] hospital lab), Hosp Order     Status: None   Collection Time: 06/24/19  1:17 AM   Specimen: Nasopharyngeal Swab  Result Value Ref Range Status   SARS Coronavirus 2 NEGATIVE  NEGATIVE Final    Comment: (NOTE) If result is NEGATIVE SARS-CoV-2 target nucleic acids are NOT DETECTED. The SARS-CoV-2 RNA is generally detectable in upper and lower  respiratory specimens during the acute phase of infection. The lowest  concentration of SARS-CoV-2 viral copies this assay can detect is 250  copies / mL. A negative result does not preclude SARS-CoV-2 infection  and should not be used as the sole basis for treatment or other  patient management decisions.  A negative result may occur with  improper specimen collection / handling, submission of specimen other  than nasopharyngeal swab, presence of viral mutation(s) within the  areas targeted by this assay, and inadequate number of viral copies  (<250 copies / mL). A negative result must be combined with clinical  observations, patient history, and epidemiological information. If result is POSITIVE SARS-CoV-2 target nucleic acids are DETECTED. The SARS-CoV-2 RNA is generally detectable in upper and lower  respiratory specimens dur ing the acute phase of infection.  Positive  results are indicative of active infection with SARS-CoV-2.  Clinical  correlation with patient history and other diagnostic information is  necessary to determine patient infection status.  Positive results do  not rule out bacterial infection or co-infection with other viruses. If result is PRESUMPTIVE POSTIVE SARS-CoV-2 nucleic acids MAY BE PRESENT.   A presumptive positive result was obtained on the submitted specimen  and confirmed on repeat testing.  While 2019 novel coronavirus  (SARS-CoV-2) nucleic acids may be present in the submitted sample  additional confirmatory testing may be  necessary for epidemiological  and / or clinical management purposes  to differentiate between  SARS-CoV-2 and other Sarbecovirus currently known to infect humans.  If clinically indicated additional testing with an alternate test  methodology [phone]) is  advised. The SARS-CoV-2 RNA is generally  detectable in upper and lower respiratory sp ecimens during the acute  phase of infection. The expected result is Negative. Fact Sheet for Patients:  StrictlyIdeas.no Fact Sheet for Healthcare Providers: BankingDealers.co.za This test is not yet approved or cleared by the [place] FDA and has been authorized for detection and/or diagnosis of SARS-CoV-2 by FDA under an Emergency Use Authorization (EUA).  This EUA will remain in effect (meaning this test can be used) for the duration of the COVID-19 declaration under Section 564(b)(1) of the Act, 21 U.S.C. section 360bbb-3(b)(1), unless the authorization is terminated or revoked sooner. Performed at [facility], [address] [place], [place] [address]   MRSA PCR Screening     Status: None   Collection Time: 06/24/19  4:17 AM   Specimen: Nasopharyngeal  Result Value Ref Range Status   MRSA by PCR NEGATIVE NEGATIVE Final    Comment:        The GeneXpert MRSA Assay (FDA approved for NASAL specimens only), is one component of a comprehensive MRSA colonization surveillance program. It is not intended to diagnose MRSA infection nor to guide or monitor treatment for MRSA infections. Performed at [facility], [address] [place], [place] [address]     Coagulation Studies: No results for input(s): "LABPROT", "INR" in the last 72 hours.  Urinalysis: No results for input(s): "COLORURINE", "LABSPEC", "PHURINE", "GLUCOSEU", "HGBUR", "BILIRUBINUR", "KETONESUR", "PROTEINUR", "UROBILINOGEN", "NITRITE", "LEUKOCYTESUR" in the last 72 hours.  Invalid input(s): "APPERANCEUR"    Imaging: MR CARDIAC MORPHOLOGY W WO CONTRAST  Result Date: 02/20/2023 CLINICAL DATA:  LVH, evaluate for HCM, hx of syncope. EXAM: CARDIAC MRI TECHNIQUE: The patient was scanned on a 1.5 Tesla Siemens magnet. A dedicated cardiac coil was used.  Functional imaging was done using Fiesta sequences. 2,3, and 4 chamber views were done to assess for RWMA's. Modified Simpson's rule using a short axis stack was used to calculate an ejection fraction on a dedicated work Conservation officer, nature. The patient received 11 cc of Gadavist. After 10 minutes inversion recovery sequences were used to assess for infiltration and scar tissue. Velocity flow mapping performed in the ascending aorta and main pulmonary artery. CONTRAST:  11 cc  of Gadavist FINDINGS: 1. Normal left ventricular size and systolic function (LVEF = 68%). There are no regional wall motion abnormalities. There is severe asymmetric left ventricular hypertrophy. LV septal wall measures upto 51m in thickness. Mild LV lateral wall hypertrophy. There is systolic anterior motion of mitral valve causing LVOT obstruction. There is no late gadolinium enhancement in the left ventricular myocardium. LVEDV: 140 ml LVESV: 45 ml SV: 96 ml CO: 6.6 L/min Myocardial mass: 236 g 2. Normal right ventricular size, thickness and systolic function (RVEF = 55%). There are no regional wall motion abnormalities. 3.  Normal left and right atrial size. 4. Normal size of the aortic root, ascending aorta and pulmonary artery. 5.  No significant valvular abnormalities. 6.  Normal pericardium.  No pericardial effusion. IMPRESSION: 1. Normal LV function, LVEF = 68% 2. Severe asymmetric left ventricular hypertrophy. LV septal wall measures upto 289min thickness. 3. There is systolic anterior motion of mitral valve causing LVOT obstruction. 4. Mild LV lateral wall hypertrophy. 5. There is no  late gadolinium enhancement in the left ventricular myocardium. 6.  Normal RV size and function. 7. Findings consistent with Hypertrophic Obstructive Cardiomyopathy. Consider referral to [place] [facility]. Electronically Signed   By: Kate Sable M.D.   On: 02/20/2023 14:22     Assessment & Plan: Mr. Aswad Mcmanus is a 62 y.o.  male  with ast medical conditions including hypertension, hypertrophic cardiomyopathy, who was admitted to [facility] on 02/17/2023 for Epistaxis [R04.0]  Acute kidney injury on suspected chronic kidney disease.  Baseline unknown.  Creatinine on admission 1.87 with GFR 41.  Renal artery ultrasound negative for hydronephrosis.  No IV contrast exposure.  Will order CKD workup and patient will need follow-up in our office at discharge.  Discussed with patient the lack of hypertension management may have led to chronic kidney disease.  2. Anemia of acute blood loss, epistaxis Lab Results  Component Value Date   HGB 10.2 (L) 02/19/2023    Hemoglobin within desired range.  Will continue to monitor.  3.  Hypertension, essential..  Currently receiving amlodipine, carvedilol, hydralazine, and losartan.  4.  Epistaxis, requiring nasal packing.  ENT following.  LOS: 3 Christena Sunderlin 3/8/20241:13 PM

## 2023-02-21 NOTE — Progress Notes (Signed)
02/21/2023 1:12 PM  Lex, Traudt [id number]  Post-nasal packing Day 5    Temp:  [97.8 F (36.6 C)-98.6 F (37 C)] 98.6 F (37 C) (03/08 0829) Pulse Rate:  [66-82] 82 (03/08 0829) Resp:  [16-24] 20 (03/08 0829) BP: (136-159)/(69-123) 147/123 (03/08 0829) SpO2:  [99 %-100 %] 100 % (03/08 0829),     Intake/Output Summary (Last 24 hours) at 02/21/2023 1312 Last data filed at 02/21/2023 0928 Gross per 24 hour  Intake 360 ml  Output 1000 ml  Net -640 ml    No results found for this or any previous visit (from the past 24 hour(s)).  SUBJECTIVE: The patient has not had any bleeding since the packing was put in the first day.  He is not having any real discomfort now as most of the air has been removed from his nasal balloon.  OBJECTIVE: Patient's blood pressure is stabilizing some.  He is now running close to the normal although he still has fairly high diastolic pressure.  The balloon was removed from his right nostril today.  He can see a healing area in the mid septum about 3 cm from the nostril opening.  No bleeding at all and no other signs of irritation to his mucosa.  His left side looks clear.  No sign of bleeding, down the back of his throat.  IMPRESSION: The epistaxis is under control and the septum is now healing well.  He does not need any further packing or treatment for his nose.  He will use a humidifier at home if we get another cold spell with the furnace runs a lot in the air gets drier in his home.  PLAN: He is okay for discharge from an ENT standpoint.  He does not need ENT follow-up unless he has further problems with nosebleeds.  He needs to keep his blood pressure under good control otherwise he certainly can have more nosebleeds in the future.  Edwin Green 02/21/2023, 1:12 PM

## 2023-02-25 DIAGNOSIS — Z0279 Encounter for issue of other medical certificate: Secondary | ICD-10-CM

## 2023-04-03 NOTE — Progress Notes (Signed)
Bethanie Dicker, NP-C Phone: [phone]  Edwin Green is a 62 y.o. male who presents today to establish care and for hypertension.  Patient was admitted to the hospital last month due to nosebleeds from extremely high blood pressure. He was not on any medications at the time. Notes reviewed. He has a Hx of hypertrophic cardiomyopathy. He used to see Cardiology in 2020 but has not seen them since. He has not been back to work since he was hospitalized, he is requesting FMLA paperwork be completed for the last 6 weeks.   HYPERTENSION Disease Monitoring Home BP Monitoring- 145-160/90-100 Chest pain- No    Dyspnea- No Medications Compliance-  Norvasc, Carvedilol, Losartan and Hydralazine. Lightheadedness-  Occasionally  Edema- No BMET    Component Value Date/Time   NA 136 02/19/2023 0556   NA 141 02/04/2018 1034   K 4.1 02/19/2023 0556   CL 108 02/19/2023 0556   CO2 23 02/19/2023 0556   GLUCOSE 114 (H) 02/19/2023 0556   BUN 20 02/19/2023 0556   BUN 16 02/04/2018 1034   CREATININE 1.88 (H) 02/19/2023 0556   CALCIUM 8.0 (L) 02/19/2023 0556   GFRNONAA 41 (L) 02/19/2023 0556   GFRAA >60 06/29/2019 0542     Active Ambulatory Problems    Diagnosis Date Noted   Malignant hypertension 06/24/2019   Nosebleed 06/24/2019   Hypertensive urgency 06/24/2019   Syncope 06/26/2019   Chest pain 06/26/2019   AKI (acute kidney injury) 06/26/2019   Hypertrophic cardiomyopathy    Epistaxis 02/17/2023   Alcohol use 02/17/2023   Chronic renal impairment, stage 3b 02/18/2023   LVH (left ventricular hypertrophy) 02/19/2023   Resolved Ambulatory Problems    Diagnosis Date Noted   No Resolved Ambulatory Problems   Past Medical History:  Diagnosis Date   Arthritis    CKD (chronic kidney disease), stage II    Depression    Hypertension     Family History  Problem Relation Age of Onset   Hypertension Mother    Stroke Father    Hypertension Father    Asthma Son     Social History    Socioeconomic History   Marital status: Single    Spouse name: Not on file   Number of children: Not on file   Years of education: Not on file   Highest education level: Not on file  Occupational History   Not on file  Tobacco Use   Smoking status: Some Days    Types: Cigarettes   Smokeless tobacco: Never  Substance and Sexual Activity   Alcohol use: Yes   Drug use: No   Sexual activity: Not Currently  Other Topics Concern   Not on file  Social History Narrative   Not on file   Social Determinants of Health   Financial Resource Strain: Not on file  Food Insecurity: No Food Insecurity (02/17/2023)   Hunger Vital Sign    Worried About Running Out of Food in the Last Year: Never true    Ran Out of Food in the Last Year: Never true  Transportation Needs: No Transportation Needs (02/17/2023)   PRAPARE - Administrator, Civil Service (Medical): No    Lack of Transportation (Non-Medical): No  Physical Activity: Not on file  Stress: Not on file  Social Connections: Not on file  Intimate Partner Violence: Not At Risk (02/17/2023)   Humiliation, Afraid, Rape, and Kick questionnaire    Fear of Current or Ex-Partner: No    Emotionally Abused: No  Physically Abused: No    Sexually Abused: No    ROS  General:  Negative for unexplained weight loss, fever Skin: Negative for new or changing mole, sore that won't heal HEENT: Negative for trouble hearing, trouble seeing, ringing in ears, mouth sores, hoarseness, change in voice, dysphagia. CV:  Negative for chest pain, dyspnea, edema, palpitations Resp: Negative for cough, dyspnea, hemoptysis GI: Negative for nausea, vomiting, diarrhea, constipation, abdominal pain, melena, hematochezia. GU: Negative for dysuria, incontinence, urinary hesitance, hematuria, vaginal or penile discharge, polyuria, sexual difficulty, lumps in testicle or breasts MSK: Negative for muscle cramps or aches, joint pain or swelling Neuro: Negative  for headaches, weakness, numbness, passing out/fainting Psych: Negative for memory problems  Objective  Physical Exam Vitals:   04/04/23 1030  BP: (!) 142/84  Pulse: 70  Temp: 98.2 F (36.8 C)  SpO2: 96%    BP Readings from Last 3 Encounters:  04/04/23 (!) 142/84  02/21/23 (!) 154/89  08/05/19 (!) 207/139   Wt Readings from Last 3 Encounters:  04/04/23 196 lb 6.4 oz (89.1 kg)  02/17/23 195 lb 1.7 oz (88.5 kg)  08/05/19 207 lb (93.9 kg)    Physical Exam Constitutional:      General: He is not in acute distress.    Appearance: Normal appearance.  HENT:     Head: Normocephalic.  Cardiovascular:     Rate and Rhythm: Normal rate and regular rhythm.     Heart sounds: Normal heart sounds.  Pulmonary:     Effort: Pulmonary effort is normal.     Breath sounds: Normal breath sounds.  Skin:    General: Skin is warm and dry.  Neurological:     General: No focal deficit present.     Mental Status: He is alert.  Psychiatric:        Mood and Affect: Mood normal.        Behavior: Behavior normal.    Assessment/Plan:   Malignant hypertension Assessment & Plan: Chronic. Uncontrolled. Elevated reading x 2 today in office. He has been taking his Norvasc 10 mg daily, Carvedilol 37.5 mg BID, Losartan 50 mg daily and Hydralazine 50 mg TID. Will increase Losartan to 100 mg daily. Encouraged patient to check his blood pressure daily at home, log provided to patient for him to bring with him to next appointment. He will follow up in 2 weeks. Will refer to Cardiology for additional evaluation. Will monitor closely. FMLA paperwork completed.  Orders: -     amLODIPine Besylate; Take 1 tablet (10 mg total) by mouth daily.  Dispense: 90 tablet; Refill: 1 -     Carvedilol; Take 3 tablets (37.5 mg total) by mouth 2 (two) times daily with a meal.  Dispense: 540 tablet; Refill: 1 -     hydrALAZINE HCl; Take 1 tablet (50 mg total) by mouth 3 (three) times daily.  Dispense: 270 tablet; Refill:  1 -     Losartan Potassium; Take 1 tablet (100 mg total) by mouth daily.  Dispense: 90 tablet; Refill: 1 -     Ambulatory referral to Cardiology -     CBC with Differential/Platelet  Hypertrophic cardiomyopathy Assessment & Plan: Continue current blood pressure medication regimen. Losartan increased to 100 mg daily. Goal BP under 140/90. Will refer to Cardiology for further evaluation.   Orders: -     Ambulatory referral to Cardiology  Chronic renal impairment, stage 3b -     Comprehensive metabolic panel  Lipid screening -     Lipid  panel  Thyroid disorder screen -     TSH   Return in about 2 weeks (around 04/18/2023) for Follow up.   Bethanie Dicker, NP-C [facility] [employer]

## 2023-04-04 ENCOUNTER — Other Ambulatory Visit: Payer: Self-pay

## 2023-04-04 ENCOUNTER — Other Ambulatory Visit: Payer: Self-pay | Admitting: Nurse Practitioner

## 2023-04-04 ENCOUNTER — Encounter: Payer: Self-pay | Admitting: Nurse Practitioner

## 2023-04-04 ENCOUNTER — Ambulatory Visit (INDEPENDENT_AMBULATORY_CARE_PROVIDER_SITE_OTHER): Payer: 59 | Admitting: Nurse Practitioner

## 2023-04-04 VITALS — BP 158/105 | HR 70 | Temp 98.2°F | Ht 72.0 in | Wt 196.4 lb

## 2023-04-04 DIAGNOSIS — I10 Essential (primary) hypertension: Secondary | ICD-10-CM | POA: Diagnosis not present

## 2023-04-04 DIAGNOSIS — Z13220 Encounter for screening for lipoid disorders: Secondary | ICD-10-CM | POA: Diagnosis not present

## 2023-04-04 DIAGNOSIS — N1832 Chronic kidney disease, stage 3b: Secondary | ICD-10-CM | POA: Diagnosis not present

## 2023-04-04 DIAGNOSIS — Z1329 Encounter for screening for other suspected endocrine disorder: Secondary | ICD-10-CM

## 2023-04-04 DIAGNOSIS — I422 Other hypertrophic cardiomyopathy: Secondary | ICD-10-CM

## 2023-04-04 LAB — COMPREHENSIVE METABOLIC PANEL
ALT: 12 U/L (ref 0–53)
AST: 14 U/L (ref 0–37)
Albumin: 4.2 g/dL (ref 3.5–5.2)
Alkaline Phosphatase: 74 U/L (ref 39–117)
BUN: 28 mg/dL — ABNORMAL HIGH (ref 6–23)
CO2: 21 mEq/L (ref 19–32)
Calcium: 8.7 mg/dL (ref 8.4–10.5)
Chloride: 105 mEq/L (ref 96–112)
Creatinine, Ser: 2.01 mg/dL — ABNORMAL HIGH (ref 0.40–1.50)
GFR: 35.66 mL/min — ABNORMAL LOW (ref >60.00)
Glucose, Bld: 95 mg/dL (ref 70–99)
Potassium: 4.1 mEq/L (ref 3.5–5.1)
Sodium: 136 mEq/L (ref 135–145)
Total Bilirubin: 0.2 mg/dL (ref 0.2–1.2)
Total Protein: 7 g/dL (ref 6.0–8.3)

## 2023-04-04 LAB — LIPID PANEL
Cholesterol: 149 mg/dL (ref 0–200)
HDL: 45.6 mg/dL (ref >39.00)
LDL Cholesterol: 91 mg/dL (ref 0–99)
NonHDL: 103.57
Total CHOL/HDL Ratio: 3
Triglycerides: 64 mg/dL (ref 0.0–149.0)
VLDL: 12.8 mg/dL (ref 0.0–40.0)

## 2023-04-04 LAB — CBC WITH DIFFERENTIAL/PLATELET
Basophils Absolute: 0.1 10*3/uL (ref 0.0–0.1)
Basophils Relative: 2 % (ref 0.0–3.0)
Eosinophils Absolute: 0.2 10*3/uL (ref 0.0–0.7)
Eosinophils Relative: 4.5 % (ref 0.0–5.0)
HCT: 38.3 % — ABNORMAL LOW (ref 39.0–52.0)
Hemoglobin: 12.5 g/dL — ABNORMAL LOW (ref 13.0–17.0)
Lymphocytes Relative: 32.3 % (ref 12.0–46.0)
Lymphs Abs: 1.5 10*3/uL (ref 0.7–4.0)
MCHC: 32.8 g/dL (ref 30.0–36.0)
MCV: 90.5 fl (ref 78.0–100.0)
Monocytes Absolute: 0.5 10*3/uL (ref 0.1–1.0)
Monocytes Relative: 9.9 % (ref 3.0–12.0)
Neutro Abs: 2.3 10*3/uL (ref 1.4–7.7)
Neutrophils Relative %: 51.3 % (ref 43.0–77.0)
Platelets: 254 10*3/uL (ref 150.0–400.0)
RBC: 4.23 Mil/uL (ref 4.22–5.81)
RDW: 14.1 % (ref 11.5–15.5)
WBC: 4.5 10*3/uL (ref 4.0–10.5)

## 2023-04-04 LAB — TSH: TSH: 4.41 u[IU]/mL (ref 0.35–5.50)

## 2023-04-04 MED ORDER — LOSARTAN POTASSIUM 100 MG PO TABS
100.0000 mg | ORAL_TABLET | Freq: Every day | ORAL | 1 refills | Status: DC
  Filled 2023-04-04 – 2023-05-01 (×2): qty 90, 90d supply, fill #0

## 2023-04-04 MED ORDER — HYDRALAZINE HCL 50 MG PO TABS
50.0000 mg | ORAL_TABLET | Freq: Three times a day (TID) | ORAL | 1 refills | Status: DC
  Filled 2023-04-04 – 2023-05-01 (×2): qty 270, 90d supply, fill #0

## 2023-04-04 MED ORDER — AMLODIPINE BESYLATE 10 MG PO TABS
10.0000 mg | ORAL_TABLET | Freq: Every day | ORAL | 1 refills | Status: DC
  Filled 2023-04-04 – 2023-05-01 (×2): qty 90, 90d supply, fill #0

## 2023-04-04 MED ORDER — CARVEDILOL 12.5 MG PO TABS
37.5000 mg | ORAL_TABLET | Freq: Two times a day (BID) | ORAL | 1 refills | Status: DC
  Filled 2023-04-04 – 2023-05-01 (×2): qty 540, 90d supply, fill #0

## 2023-04-04 NOTE — Assessment & Plan Note (Signed)
Chronic. Uncontrolled. Elevated reading x 2 today in office. He has been taking his Norvasc 10 mg daily, Carvedilol 37.5 mg BID, Losartan 50 mg daily and Hydralazine 50 mg TID. Will increase Losartan to 100 mg daily. Encouraged patient to check his blood pressure daily at home, log provided to patient for him to bring with him to next appointment. He will follow up in 2 weeks. Will refer to Cardiology for additional evaluation. Will monitor closely. FMLA paperwork completed.

## 2023-04-04 NOTE — Assessment & Plan Note (Signed)
Continue current blood pressure medication regimen. Losartan increased to 100 mg daily. Goal BP under 140/90. Will refer to Cardiology for further evaluation.

## 2023-04-08 ENCOUNTER — Telehealth: Payer: Self-pay | Admitting: Nurse Practitioner

## 2023-04-08 NOTE — Telephone Encounter (Signed)
Patient dropped off a paper that is part of the FMLA paper work that needs to be signed by Edwin Green at the bottom of the page. FMLA Paper work is up front in [employer].Patient also needs a letter stating when to return to work. Please call patient at [phone] when ready to pick up FMLA and Letter.

## 2023-04-09 NOTE — Telephone Encounter (Signed)
Form has been placed in provider to be signed folder.

## 2023-04-10 ENCOUNTER — Telehealth: Payer: Self-pay

## 2023-04-10 NOTE — Telephone Encounter (Signed)
Tried to call pt for labs but was getting an error msg saying call can not be completed

## 2023-04-10 NOTE — Telephone Encounter (Signed)
Called and spoke with son to have pt Call the office back as we have beent rying to contact pt all day and it rings and gives an error msg.   Son verbalized understanding and confirmed pts phone number and stated he will have pt call the office back.   FMLA forms pt dropped off have been signed, labeled and placed in the designated pick up area.

## 2023-04-14 ENCOUNTER — Telehealth: Payer: Self-pay | Admitting: Nurse Practitioner

## 2023-04-14 NOTE — Telephone Encounter (Signed)
Pt called backk and apologized his boss came and picked up the forms

## 2023-04-14 NOTE — Telephone Encounter (Signed)
error 

## 2023-04-14 NOTE — Telephone Encounter (Signed)
Needing Return to work Physicist, medical.

## 2023-04-16 ENCOUNTER — Ambulatory Visit: Payer: No Typology Code available for payment source | Admitting: Nurse Practitioner

## 2023-04-18 ENCOUNTER — Other Ambulatory Visit: Payer: Self-pay

## 2023-04-21 ENCOUNTER — Telehealth: Payer: Self-pay | Admitting: Nurse Practitioner

## 2023-04-21 NOTE — Telephone Encounter (Signed)
Patient is requesting a letter to go back to work. Patient states his work is giving him issues and he rather go back to work then deal with them. Please send to MyChart.

## 2023-04-21 NOTE — Telephone Encounter (Signed)
Letter has been sent to pt via mychart per his request, stating that Per FMLA paper work that has been completed and signed Pt may return to work on 04/18/23.

## 2023-04-24 ENCOUNTER — Encounter: Payer: Self-pay | Admitting: Nurse Practitioner

## 2023-04-24 NOTE — Telephone Encounter (Signed)
Pt called stating that he need two more forms to be filled out for [facility] and he will upload it to [employer]

## 2023-04-30 ENCOUNTER — Telehealth: Payer: Self-pay | Admitting: Nurse Practitioner

## 2023-04-30 NOTE — Telephone Encounter (Addendum)
Patient dropped off Short term Disability claim form to be completed. Patient was advised that provider has 5 to 7 days to complete. Forms are up front in [employer] folder. Please send a MyChart message when papers are ready to be picked up.

## 2023-05-02 ENCOUNTER — Other Ambulatory Visit: Payer: Self-pay

## 2023-05-07 ENCOUNTER — Telehealth: Payer: Self-pay

## 2023-05-07 NOTE — Telephone Encounter (Signed)
Pts [employer] forms have been placed an envelope ready for his pick up he has been notified via mychart and  forms have been placed up front in the designated pick up area.

## 2023-05-09 NOTE — Telephone Encounter (Signed)
Pt called in stating that he's unable to come in person to pick papers up. So he would like for [place] to fax [phone] Att: Martha Clan.

## 2023-05-13 NOTE — Telephone Encounter (Signed)
Forms faxed to the given fax number

## 2023-05-16 ENCOUNTER — Telehealth: Payer: Self-pay

## 2023-05-16 ENCOUNTER — Other Ambulatory Visit: Payer: Self-pay

## 2023-05-16 NOTE — Telephone Encounter (Signed)
Some more [employer] forms have been received via fax for pt, they have been placed in provider to be signed folder

## 2023-05-26 NOTE — Telephone Encounter (Signed)
Provider has signed the [employer] forms and I have attached the records that are requested and have faxed the forms to the given fax number [phone] with a completed transmission log

## 2023-08-05 ENCOUNTER — Ambulatory Visit (INDEPENDENT_AMBULATORY_CARE_PROVIDER_SITE_OTHER): Payer: 59 | Admitting: Family

## 2023-08-05 ENCOUNTER — Inpatient Hospital Stay
Admission: EM | Admit: 2023-08-05 | Discharge: 2023-08-09 | DRG: 065 | Disposition: A | Discharge status: Home Health | Payer: No Typology Code available for payment source | Attending: Internal Medicine | Admitting: Internal Medicine

## 2023-08-05 ENCOUNTER — Emergency Department: Payer: No Typology Code available for payment source

## 2023-08-05 ENCOUNTER — Other Ambulatory Visit: Payer: Self-pay

## 2023-08-05 ENCOUNTER — Encounter: Payer: Self-pay | Admitting: Family

## 2023-08-05 VITALS — BP 136/78 | HR 71 | Temp 98.1°F | Ht 73.0 in | Wt 181.0 lb

## 2023-08-05 DIAGNOSIS — R7303 Prediabetes: Secondary | ICD-10-CM | POA: Diagnosis present

## 2023-08-05 DIAGNOSIS — Z825 Family history of asthma and other chronic lower respiratory diseases: Secondary | ICD-10-CM | POA: Diagnosis not present

## 2023-08-05 DIAGNOSIS — E785 Hyperlipidemia, unspecified: Secondary | ICD-10-CM | POA: Diagnosis present

## 2023-08-05 DIAGNOSIS — I629 Nontraumatic intracranial hemorrhage, unspecified: Secondary | ICD-10-CM

## 2023-08-05 DIAGNOSIS — R531 Weakness: Secondary | ICD-10-CM

## 2023-08-05 DIAGNOSIS — F1290 Cannabis use, unspecified, uncomplicated: Secondary | ICD-10-CM | POA: Diagnosis present

## 2023-08-05 DIAGNOSIS — J9811 Atelectasis: Secondary | ICD-10-CM | POA: Diagnosis present

## 2023-08-05 DIAGNOSIS — R410 Disorientation, unspecified: Secondary | ICD-10-CM | POA: Diagnosis not present

## 2023-08-05 DIAGNOSIS — R5381 Other malaise: Secondary | ICD-10-CM | POA: Diagnosis present

## 2023-08-05 DIAGNOSIS — Z91148 Patient's other noncompliance with medication regimen for other reason: Secondary | ICD-10-CM | POA: Diagnosis not present

## 2023-08-05 DIAGNOSIS — F17210 Nicotine dependence, cigarettes, uncomplicated: Secondary | ICD-10-CM | POA: Diagnosis present

## 2023-08-05 DIAGNOSIS — I2489 Other forms of acute ischemic heart disease: Secondary | ICD-10-CM | POA: Diagnosis present

## 2023-08-05 DIAGNOSIS — R4701 Aphasia: Secondary | ICD-10-CM | POA: Diagnosis present

## 2023-08-05 DIAGNOSIS — I6389 Other cerebral infarction: Secondary | ICD-10-CM | POA: Diagnosis not present

## 2023-08-05 DIAGNOSIS — N179 Acute kidney failure, unspecified: Secondary | ICD-10-CM | POA: Diagnosis present

## 2023-08-05 DIAGNOSIS — Z79899 Other long term (current) drug therapy: Secondary | ICD-10-CM

## 2023-08-05 DIAGNOSIS — R471 Dysarthria and anarthria: Secondary | ICD-10-CM | POA: Diagnosis present

## 2023-08-05 DIAGNOSIS — I43 Cardiomyopathy in diseases classified elsewhere: Secondary | ICD-10-CM | POA: Diagnosis present

## 2023-08-05 DIAGNOSIS — I610 Nontraumatic intracerebral hemorrhage in hemisphere, subcortical: Secondary | ICD-10-CM

## 2023-08-05 DIAGNOSIS — I161 Hypertensive emergency: Secondary | ICD-10-CM | POA: Diagnosis present

## 2023-08-05 DIAGNOSIS — G8191 Hemiplegia, unspecified affecting right dominant side: Secondary | ICD-10-CM | POA: Diagnosis present

## 2023-08-05 DIAGNOSIS — I10 Essential (primary) hypertension: Secondary | ICD-10-CM

## 2023-08-05 DIAGNOSIS — R29810 Facial weakness: Secondary | ICD-10-CM | POA: Diagnosis present

## 2023-08-05 DIAGNOSIS — F1090 Alcohol use, unspecified, uncomplicated: Secondary | ICD-10-CM | POA: Diagnosis present

## 2023-08-05 DIAGNOSIS — R4781 Slurred speech: Secondary | ICD-10-CM

## 2023-08-05 DIAGNOSIS — N1832 Chronic kidney disease, stage 3b: Secondary | ICD-10-CM | POA: Diagnosis present

## 2023-08-05 DIAGNOSIS — I618 Other nontraumatic intracerebral hemorrhage: Secondary | ICD-10-CM | POA: Diagnosis present

## 2023-08-05 DIAGNOSIS — G936 Cerebral edema: Secondary | ICD-10-CM | POA: Diagnosis not present

## 2023-08-05 DIAGNOSIS — Z823 Family history of stroke: Secondary | ICD-10-CM

## 2023-08-05 DIAGNOSIS — Z8249 Family history of ischemic heart disease and other diseases of the circulatory system: Secondary | ICD-10-CM

## 2023-08-05 DIAGNOSIS — I422 Other hypertrophic cardiomyopathy: Secondary | ICD-10-CM | POA: Diagnosis present

## 2023-08-05 DIAGNOSIS — I1310 Hypertensive heart and chronic kidney disease without heart failure, with stage 1 through stage 4 chronic kidney disease, or unspecified chronic kidney disease: Secondary | ICD-10-CM | POA: Diagnosis present

## 2023-08-05 DIAGNOSIS — R29706 NIHSS score 6: Secondary | ICD-10-CM | POA: Diagnosis present

## 2023-08-05 DIAGNOSIS — Z716 Tobacco abuse counseling: Secondary | ICD-10-CM | POA: Diagnosis not present

## 2023-08-05 DIAGNOSIS — F32A Depression, unspecified: Secondary | ICD-10-CM | POA: Diagnosis present

## 2023-08-05 DIAGNOSIS — I619 Nontraumatic intracerebral hemorrhage, unspecified: Secondary | ICD-10-CM | POA: Diagnosis not present

## 2023-08-05 DIAGNOSIS — R413 Other amnesia: Secondary | ICD-10-CM | POA: Diagnosis present

## 2023-08-05 LAB — BASIC METABOLIC PANEL
Anion gap: 8 (ref 5–15)
BUN: 25 mg/dL — ABNORMAL HIGH (ref 6–20)
CO2: 23 mmol/L (ref 22–32)
Calcium: 8.6 mg/dL — ABNORMAL LOW (ref 8.9–10.3)
Chloride: 104 mmol/L (ref 98–111)
Creatinine, Ser: 2.01 mg/dL — ABNORMAL HIGH (ref 0.61–1.24)
GFR, Estimated: 38 mL/min — ABNORMAL LOW (ref >60)
Glucose, Bld: 95 mg/dL (ref 70–99)
Potassium: 3.6 mmol/L (ref 3.5–5.1)
Sodium: 135 mmol/L (ref 135–145)

## 2023-08-05 LAB — URINE DRUG SCREEN, QUALITATIVE (ARMC ONLY)
Amphetamines, Ur Screen: NOT DETECTED
Barbiturates, Ur Screen: NOT DETECTED
Benzodiazepine, Ur Scrn: NOT DETECTED
Cannabinoid 50 Ng, Ur ~~LOC~~: POSITIVE — AB
Cocaine Metabolite,Ur ~~LOC~~: NOT DETECTED
MDMA (Ecstasy)Ur Screen: NOT DETECTED
Methadone Scn, Ur: NOT DETECTED
Opiate, Ur Screen: NOT DETECTED
Phencyclidine (PCP) Ur S: NOT DETECTED
Tricyclic, Ur Screen: NOT DETECTED

## 2023-08-05 LAB — TROPONIN I (HIGH SENSITIVITY)
Troponin I (High Sensitivity): 28 ng/L — ABNORMAL HIGH (ref <18)
Troponin I (High Sensitivity): 25 ng/L — ABNORMAL HIGH (ref <18)

## 2023-08-05 LAB — CBC
HCT: 40.3 % (ref 39.0–52.0)
Hemoglobin: 13.5 g/dL (ref 13.0–17.0)
MCH: 30.4 pg (ref 26.0–34.0)
MCHC: 33.5 g/dL (ref 30.0–36.0)
MCV: 90.8 fL (ref 80.0–100.0)
Platelets: 228 10*3/uL (ref 150–400)
RBC: 4.44 MIL/uL (ref 4.22–5.81)
RDW: 13 % (ref 11.5–15.5)
WBC: 3.6 10*3/uL — ABNORMAL LOW (ref 4.0–10.5)
nRBC: 0 % (ref 0.0–0.2)

## 2023-08-05 LAB — APTT: aPTT: 28 seconds (ref 24–36)

## 2023-08-05 LAB — URINALYSIS, ROUTINE W REFLEX MICROSCOPIC
Bilirubin Urine: NEGATIVE
Glucose, UA: NEGATIVE mg/dL
Hgb urine dipstick: NEGATIVE
Ketones, ur: NEGATIVE mg/dL
Leukocytes,Ua: NEGATIVE
Nitrite: NEGATIVE
Protein, ur: NEGATIVE mg/dL
Specific Gravity, Urine: 1.013 (ref 1.005–1.030)
pH: 5 (ref 5.0–8.0)

## 2023-08-05 LAB — PROTIME-INR
INR: 1 (ref 0.8–1.2)
Prothrombin Time: 13.7 seconds (ref 11.4–15.2)

## 2023-08-05 MED ORDER — DOCUSATE SODIUM 100 MG PO CAPS: 100.0000 mg | ORAL_CAPSULE | Freq: Two times a day (BID) | ORAL | Status: DC | PRN

## 2023-08-05 MED ORDER — PANTOPRAZOLE SODIUM 40 MG IV SOLR
40.0000 mg | Freq: Every day | INTRAVENOUS | Status: DC
  Administered 2023-08-05 – 2023-08-08 (×4): 40 mg via INTRAVENOUS
  Filled 2023-08-05 (×4): qty 10

## 2023-08-05 MED ORDER — NICARDIPINE HCL IN NACL 20-0.86 MG/200ML-% IV SOLN
3.0000 mg/h | INTRAVENOUS | Status: DC
  Administered 2023-08-05 (×2): 5 mg/h via INTRAVENOUS
  Administered 2023-08-05: 7.5 mg/h via INTRAVENOUS
  Administered 2023-08-05: 15 mg/h via INTRAVENOUS
  Administered 2023-08-05: 7.5 mg/h via INTRAVENOUS
  Administered 2023-08-06: 5 mg/h via INTRAVENOUS
  Filled 2023-08-05 (×6): qty 200

## 2023-08-05 MED ORDER — LABETALOL HCL 5 MG/ML IV SOLN
10.0000 mg | Freq: Once | INTRAVENOUS | Status: AC
  Administered 2023-08-05: 10 mg via INTRAVENOUS
  Filled 2023-08-05: qty 4

## 2023-08-05 MED ORDER — STROKE: EARLY STAGES OF RECOVERY BOOK: Freq: Once | Status: AC

## 2023-08-05 MED ORDER — POLYETHYLENE GLYCOL 3350 17 G PO PACK: 17.0000 g | PACK | Freq: Every day | ORAL | Status: DC | PRN

## 2023-08-05 MED ORDER — ACETAMINOPHEN 650 MG RE SUPP: 650.0000 mg | RECTAL | Status: DC | PRN

## 2023-08-05 MED ORDER — SENNOSIDES-DOCUSATE SODIUM 8.6-50 MG PO TABS
1.0000 | ORAL_TABLET | Freq: Two times a day (BID) | ORAL | Status: DC
  Administered 2023-08-06 – 2023-08-08 (×2): 1 via ORAL
  Filled 2023-08-05 (×4): qty 1

## 2023-08-05 MED ORDER — HYDRALAZINE HCL 20 MG/ML IJ SOLN: 10.0000 mg | Freq: Once | INTRAMUSCULAR | Status: DC

## 2023-08-05 MED ORDER — LABETALOL HCL 5 MG/ML IV SOLN
10.0000 mg | INTRAVENOUS | Status: DC | PRN
  Administered 2023-08-06 – 2023-08-08 (×4): 10 mg via INTRAVENOUS
  Filled 2023-08-05 (×3): qty 4

## 2023-08-05 MED ORDER — ACETAMINOPHEN 325 MG PO TABS: 650.0000 mg | ORAL_TABLET | ORAL | Status: DC | PRN

## 2023-08-05 MED ORDER — ONDANSETRON HCL 4 MG/2ML IJ SOLN: 4.0000 mg | Freq: Four times a day (QID) | INTRAMUSCULAR | Status: DC | PRN

## 2023-08-05 MED ORDER — GADOBUTROL 1 MMOL/ML IV SOLN
8.0000 mL | Freq: Once | INTRAVENOUS | Status: AC | PRN
  Administered 2023-08-05: 8 mL via INTRAVENOUS

## 2023-08-05 MED ORDER — ACETAMINOPHEN 160 MG/5ML PO SOLN: 650.0000 mg | ORAL | Status: DC | PRN

## 2023-08-05 NOTE — H&P (Signed)
NAME:  Edwin Green, MRN:  [id number], DOB:  01-14-1961, LOS: 0 ADMISSION DATE:  08/05/2023, CONSULTATION DATE: 08/05/2023 REFERRING MD: Dr. Arnoldo Morale, CHIEF COMPLAINT: Hypertension, confusion, slurred speech  History of Present Illness:  62 year old male presenting to [facility] ED from PCP office via EMS on 08/05/2023 for evaluation of hypertension in the setting of difficulty with speech and intermittent confusion.  History obtained per chart review and bedside interview with patient*** Patient reports that a week ago he had sudden onset difficulty with his speech and some confusion.  His example was extreme difficulty using his phone, difficulty with numbers and right-handed weakness.  He explained he believes his symptoms have somewhat improved over the last few days.  The patient son last spoke to him at the end of the month reporting that his father had been briefly living with him but they had moved away recently and he is not sure if his father has been taking his medications as prescribed recently. The patient was dropped off at his PCP office by his employer who had concerns that the patient was confused, forgetful with slurred speech.  The employer also spoke with the patient's roommate who reports that he has been unable to walk for about a week and had right-sided weakness.  This was described as he was using the wall to keep himself upright.  Patient confirmed with PCP he has been off of all of his medications for about a month.  EMS was called for transport to ED for evaluation of possible stroke. ETOH use 1-4 beers/daily, daily smoker 1-2 cigarettes daily Of note he was admitted in March 2024 due to nosebleeds from uncontrolled hypertension and he was not on any medications at that time.  He was seen outpatient on 04/04/2023 with plans for follow-up appointment in 2 weeks and referral to cardiology.  He does not appear that he has seen a provider since 04/04/2023 prior to today's earlier visit. ED  course: Upon arrival patient alert and responsive with mild confusion/memory impairment.  Vital signs initially stable but upon second check patient in hypertensive urgency with BP 232/156.  This was treated with IV as needed without effect and nicardipine drip was initiated.  CT imaging showed 1.3 cm likely intraparenchymal hemorrhage in the left thalamus with local mass effect.  Neurology was urgently consulted and MRI evaluation ordered to rule out underlying mass.  MRI confirmed thalamic bleed with surrounding edema and mild mass effect no underlying mass.  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  Initial NIHSS 6. Medications given: Labetalol, Protonix, nicardipine drip started, IV contrast Initial Vitals: 98.1, 71, 136/78 and 98% on room air  Significant labs: (Labs/ Imaging personally reviewed) I, Cheryll Cockayne Rust-Chester, AGACNP-BC, personally viewed and interpreted this ECG. EKG Interpretation: Date: 08/05/2023, EKG Time: 12:38, Rate: 72, Rhythm: NSR, QRS Axis: LAD Intervals: LVH, prolonged QT, ST/T Wave abnormalities: Lateral T wave inversions, Narrative Interpretation: NSR with signs of LVH and prolonged QT and lateral T wave inversions that are unchanged from previous EKG Chemistry: Na+: 135, K+: 3.6, BUN/Cr.:  25/2.01 serum CO2/ AG: 23/8 Hematology: WBC: 3.6, Hgb: 13.5,  Troponin: 28 > 25  CXR 08/05/23: small amt of linear scarring of atelectasis at the Left lung base. CT head wo contrast 08/05/23: Ill defied 1.3 cm area of hyperdensity in the left thalamus, suspicious for acute intraparenchymal hemorrhage. Surrounding edema and mild regional mass effect.  MRI brain  w/wo contrast, MRA head & neck 08/05/23: 2.2 cm hematoma centered in the left thalamus, with surrounding edema and mild mass effect on the atrium of the left lateral ventricle and third ventricle. No evidence of active extravasation  into the hematoma. No hydrocephalus. No intracranial large vessel occlusion or significant stenosis. No hemodynamically significant stenosis in the neck.  PCCM consulted for admission due to .  Pertinent  Medical History  Uncontrolled hypertension CKD stage IIIb EtOH use THC use Hypertensive cardiomyopathy Tobacco use   Significant Hospital Events: Including procedures, antibiotic start and stop dates in addition to other pertinent events     Interim History / Subjective:  ***  Objective   Blood pressure (!) 149/98, pulse 94, temperature 98.4 F (36.9 C), resp. rate 15, height 6\' 1"  (1.854 m), weight 82.1 kg, SpO2 99%.       No intake or output data in the 24 hours ending 08/05/23 2224 Filed Weights   08/05/23 1233  Weight: 82.1 kg    Examination: General: Adult ***, critically***acutely ill, lying in bed intubated & sedated requiring mechanical ventilation *** NAD HEENT: MM pink/moist, anicteric***, atraumatic, neck supple Neuro: A&O x 3, able to follow commands, PERRL *** , MAE  Neurological Exam Level of arousal and orientation to time, place, and person were intact. Language including expression, naming, repetition, comprehension was assessed and found intact. Attention span and concentration were normal Recent and remote memory were intact Fund of Knowledge was assessed and was intact CN: II - Visual field intact OU III, IV, VI - Extraocular movements intact. V - Facial sensation intact bilaterally. VII - Facial movement intact bilaterally VIII - Hearing & vestibular intact bilaterally X - Palate elevates symmetrically XI - Chin turning & shoulder shrug intact bilaterally. XII - Tongue protrusion intact Motor Strength - strength 5/5 all extremities *** and pronator drift was absent.  Sensory - Light touch was assessed and was symmetrical Coordination - The patient had normal movements in the hands and feet with no ataxia or dysmetria.  Tremor was absent Gait  and Station - deferred.  NIHSS Level of  Consciousness: 0 = Alert, keenly responsive; 1 = Not alert, but arousable by minor stimulation to obey, answer, or respond; 2 = Not alert, requires repeated stimulation to attend, or is obtunded and requires strong or painful stimulation to make movements (not stereotyped); 3 = Responds only with reflex motor or autonomic effects or totally unresponsive, flaccid, and areflexic   0  LOC Questions 0 = Answers both questions correctly; 1 = Answers one question correctly; 2 = Answers neither question correctly.  0  LOC Commands 0 = Performs both tasks correctly; 1 = Performs one task correctly; 2 = Performs neither task correctly  0  Best Gaze  0 = Normal; 1 = Partial gaze palsy; gaze is abnormal in one or both eyes, but forced deviation or total gaze paresis is not present; 2 = Forced deviation, or total gaze paresis not overcome by the oculocephalic maneuver   0  Visual 0 = No visual loss; 1 = Partial hemianopia; 2 = Complete hemianopia; 3 = Bilateral hemianopia (blind including cortical blindness).   0  Facial Palsy 0 = Normal symmetrical movements; 1 = Minor paralysis (flattened nasolabial fold, asymmetry on smiling); 2 = Partial paralysis (total or near-total paralysis of lower face); 3 = Complete paralysis of one or both sides (absence of facial movement in the upper and lower face).  0  Motor Arm LEFT 0 = No  drift; limb holds 90 (or 45) degrees for full 10 secs; 1 = Drift; limb holds 90 (or 45) degrees, but drifts down before full 10 seconds; does not hit bed or other support; 2 = Some effort against gravity; limb cannot get to or maintain (if cued) 90 (or 45) degrees, drifts down to bed, but has some effort against gravity; 3 = No effort against gravity; limb falls; 4 = No movement; UN = unable to test (Amputation or joint fusion).   0  Motor Arm RIGHT 0 = No drift; limb holds 90 (or 45) degrees for full 10 secs; 1 = Drift; limb holds 90 (or 45) degrees, but  drifts down before full 10 seconds; does not hit bed or other support; 2 = Some effort against gravity; limb cannot get to or maintain (if cued) 90 (or 45) degrees, drifts down to bed, but has some effort against gravity; 3 = No effort against gravity; limb falls; 4 = No movement; UN = unable to test (Amputation or joint fusion).   0  Motor Leg LEFT 0 = No drift; leg holds 30 degree position for full 5 secs; 1 = Drift; leg falls by the end of the 5-sec period but does not hit bed; 2 = Some effort against gravity; leg falls to bed by 5 secs, but has some effort against gravity; 3 = No effort against gravity; leg falls to bed immediately; 4 = No movement; UN = unable to test (Amputation or joint fusion).   0  Motor Leg RIGHT 0 = No drift; leg holds 30 degree position for full 5 secs; 1 = Drift; leg falls by the end of the 5-sec period but does not hit bed; 2 = Some effort against gravity; leg falls to bed by 5 secs, but has some effort against gravity; 3 = No effort against gravity; leg falls to bed immediately; 4 = No movement; UN = unable to test (Amputation or joint fusion).   0  Limb Ataxia 0 = Absent; 1 = Present in one limb; 2 = Present in two limbs; UN = Amputation or joint fusion   0  Sensory  0 = Normal, no sensory loss; 1 = Mild-to-moderate sensory loss, patient feels pinprick is less sharp or is dull on the affected side, or there is a loss of superficial pain with pinprick, but patient is aware of being touched; 2 = Severe to total sensory loss, patient is not aware of being touched in the face, arm, and leg.   0  Best Language 0 = No aphasia; normal; 1 = Mild-to-moderate aphasia; some obvious loss of fluency or facility of comprehension, w/o significant limitation on ideas expressed or form of expression. Reduction of speech and/or comprehension, however, makes conversation about provided materials difficult or impossible. For example, in conversation about provided materials, examiner can identify  picture or naming card content from patient's response; 2 = Severe aphasia; all communication is through fragmentary expression; great need for inference, questioning, and guessing by the listener. Range of information that can be exchanged is limited; listener carries burden of communication. Examiner cannot identify materials provided from patient response; 3 = Mute, global aphasia; no usable speech or auditory comprehension  1  Dysarthria 0 = Normal; 1 = Mild-to-moderate dysarthria, patient slurs at least some words and, at worst, can be understood with some difficulty; 2 = Severe dysarthria, patient's speech is so slurred as to be unintelligible in the absence of or out of proportion to any  dysphasia, or is mute/anarthric; UN = Intubated or other physical barrier  1  Extinction/Inattention 0 = No abnormality 1 = Visual, tactile, auditory, spatial, or personal extinction/inattention to bilateral simultaneous stimulation in one of the sensory modalities. 2 = Profound hemi-inattention or extinction to more than one modality; does not recognize own hand or orients to only one side of space.  0  Total RUE weakness in exam, no drift noted  2  CV: s1s2 ***RRR, *** on monitor, no r/m/g Pulm: Regular, non labored on *** , breath sounds ***-BUL & ***-BLL GI: soft, ***, non***tender, bs x 4 GU: foley in place *** with clear yellow urine Skin: *** no rashes/lesions noted Extremities: warm/dry, pulses + 2 R/P, *** edema noted  Resolved Hospital Problem list     Assessment & Plan:  Hemorrhagic stroke suspect secondary to uncontrolled hypertension Hypertensive urgency in the setting of uncontrolled hypertension and medication noncompliance Underlying mass ruled out with MRI - Neuro checks Q 1 - NIHSS Q shift or with any change in mentation - perform bedside swallow > SLP eval if needed - PT/OT consult - Neurology consulted, appreciate input - Echocardiogram with bubble ordered - f/u Lipid panel & Hgb  A1c  -SCDs for VTE prophylaxis, no antiplatelets due to ICH -Blood pressure goal 150-180/90-120, wean nicardipine drip as tolerated -Consider resuming outpatient antihypertensives as patient stabilizes  Suspected acute Kidney Injury superimposed on CKD stage IIIb  Baseline Cr: 1.87, Cr on admission: 2.01.  However labs at last PCP on 04/04/2023 are consistent with this creatinine - Strict I/O's: alert provider if UOP < 0.5 mL/kg/hr - gentle IVF hydration  - Daily BMP, replace electrolytes PRN - Avoid nephrotoxic agents as able, ensure adequate renal perfusion   Best Practice (right click and "Reselect all SmartList Selections" daily)  Diet/type: {diet type:25684} DVT prophylaxis: {anticoagulation (Optional):25687} GI prophylaxis: {WU:98119} Lines: {Central Venous Access:25771} Foley:  {Central Venous Access:25691} Code Status:  {Code Status:26939} Last date of multidisciplinary goals of care discussion [***]  Labs   CBC: Recent Labs  Lab 08/05/23 1235  WBC 3.6*  HGB 13.5  HCT 40.3  MCV 90.8  PLT 228    Basic Metabolic Panel: Recent Labs  Lab 08/05/23 1235  NA 135  K 3.6  CL 104  CO2 23  GLUCOSE 95  BUN 25*  CREATININE 2.01*  CALCIUM 8.6*   GFR: Estimated Creatinine Clearance: 44.7 mL/min (A) (by C-G formula based on SCr of 2.01 mg/dL (H)). Recent Labs  Lab 08/05/23 1235  WBC 3.6*    Liver Function Tests: No results for input(s): "AST", "ALT", "ALKPHOS", "BILITOT", "PROT", "ALBUMIN" in the last 168 hours. No results for input(s): "LIPASE", "AMYLASE" in the last 168 hours. No results for input(s): "AMMONIA" in the last 168 hours.  ABG No results found for: "PHART", "PCO2ART", "PO2ART", "HCO3", "TCO2", "ACIDBASEDEF", "O2SAT"   Coagulation Profile: Recent Labs  Lab 08/05/23 1424  INR 1.0    Cardiac Enzymes: No results for input(s): "CKTOTAL", "CKMB", "CKMBINDEX", "TROPONINI" in the last 168 hours.  HbA1C: No results found for: "HGBA1C"  CBG: No  results for input(s): "GLUCAP" in the last 168 hours.  Review of Systems: Positives in BOLD***  Gen: Denies fever, chills, weight change, fatigue, night sweats HEENT: Denies blurred vision, double vision, hearing loss, tinnitus, sinus congestion, rhinorrhea, sore throat, neck stiffness, dysphagia PULM: Denies shortness of breath, cough, sputum production, hemoptysis, wheezing CV: Denies chest pain, edema, orthopnea, paroxysmal nocturnal dyspnea, palpitations GI: Denies abdominal pain, nausea, vomiting, diarrhea, hematochezia,  melena, constipation, change in bowel habits GU: Denies dysuria, hematuria, polyuria, oliguria, urethral discharge Endocrine: Denies hot or cold intolerance, polyuria, polyphagia or appetite change Derm: Denies rash, dry skin, scaling or peeling skin change Heme: Denies easy bruising, bleeding, bleeding gums Neuro: Denies headache, numbness, weakness, slurred speech, loss of memory or consciousness  Past Medical History:  He,  has a past medical history of Arthritis, CKD (chronic kidney disease), stage II, Depression, Epistaxis, Hypertension, and Hypertrophic cardiomyopathy (HCC).   Surgical History:   Past Surgical History:  Procedure Laterality Date   NO PAST SURGERIES       Social History:   reports that he has been smoking cigarettes. He has never used smokeless tobacco. He reports current alcohol use. He reports that he does not use drugs.   Family History:  His family history includes Asthma in his son; Hypertension in his father and mother; Stroke in his father.   Allergies No Known Allergies   Home Medications  Prior to Admission medications   Medication Sig Start Date End Date Taking? Authorizing Provider  amLODipine (NORVASC) 10 MG tablet Take 1 tablet (10 mg total) by mouth daily. 04/04/23  Yes Bethanie Dicker, NP  carvedilol (COREG) 12.5 MG tablet Take 3 tablets (37.5 mg total) by mouth 2 (two) times daily with a meal. 04/04/23  Yes Bethanie Dicker, NP   hydrALAZINE (APRESOLINE) 50 MG tablet Take 1 tablet (50 mg total) by mouth 3 (three) times daily. 04/04/23  Yes Bethanie Dicker, NP  losartan (COZAAR) 100 MG tablet Take 1 tablet (100 mg total) by mouth daily. 04/04/23  Yes Bethanie Dicker, NP     Critical care time: ***       Cheryll Cockayne Rust-Chester, AGACNP-BC Acute Care Nurse Practitioner [facility]   [phone] / [phone] Please see Amion for details.

## 2023-08-05 NOTE — ED Triage Notes (Signed)
Pt to ED [facility] from PCP for HTN, has been out of meds for a month. Reports having slurred speech a couple weeks ago that resolved. Clear speech.

## 2023-08-05 NOTE — ED Provider Notes (Signed)
[facility] Provider Note    Event Date/Time   First MD Initiated Contact with Patient 08/05/23 1333     (approximate)   History   No chief complaint on file.   HPI  Edwin Green is a 62 y.o. male with a history of hyper tension, hypertrophic cardiomyopathy, CKD, and depression who presents with elevated blood pressure and vague symptoms of not feeling well over approximate the last week.  The patient states that he went to the doctor today because about a week ago he started to feel unwell although cannot identify any specific symptoms, using terms such as "the situation" to describe what he was feeling.  He states that he has been out of his blood pressure medication for approximate last month although he did not intentionally discontinue it.  The patient had an episode of right-sided weakness that had improved.  He also has had some memory loss, and has been somewhat intermittently confused.  He denies any headache, vision changes, weakness or numbness in the arms or legs, difficulty walking, or any acute change in his symptoms today.  I reviewed the past medical records.  The patient was seen at [facility] primary care today for the symptoms and referred to the ED for treatment of hypertension and neurologic workup.  He was admitted to the hospitalist service in March for epistaxis and uncontrolled hypertension.   Physical Exam   Triage Vital Signs: ED Triage Vitals  Encounter Vitals Group     BP 08/05/23 1231 (!) 232/156     Systolic BP Percentile --      Diastolic BP Percentile --      Pulse Rate 08/05/23 1231 72     Resp 08/05/23 1231 20     Temp 08/05/23 1233 98.4 F (36.9 C)     Temp src --      SpO2 08/05/23 1231 100 %     Weight 08/05/23 1233 181 lb (82.1 kg)     Height 08/05/23 1233 6\' 1"  (1.854 m)     Head Circumference --      Peak Flow --      Pain Score 08/05/23 1232 0     Pain Loc --      Pain Education --      Exclude from Growth  Chart --     Most recent vital signs: Vitals:   08/05/23 1525 08/05/23 1530  BP: (!) 152/95 (!) 157/100  Pulse: 81 79  Resp: 15 18  Temp:    SpO2: 99% 100%     General: Alert, oriented x 3, no distress.  CV:  Good peripheral perfusion.  Resp:  Normal effort.  Abd:  No distention.  Other:  Mildly confused when answering certain questions.  EOMI.  PERRLA.  No facial droop.  5/5 motor strength and intact sensation all extremities.  No pronator drift.  No ataxia.  Normal beach.   ED Results / Procedures / Treatments   Labs (all labs ordered are listed, but only abnormal results are displayed) Labs Reviewed  BASIC METABOLIC PANEL - Abnormal; Notable for the following components:      Result Value   BUN 25 (*)    Creatinine, Ser 2.01 (*)    Calcium 8.6 (*)    GFR, Estimated 38 (*)    All other components within normal limits  CBC - Abnormal; Notable for the following components:   WBC 3.6 (*)    All other components within normal limits  URINALYSIS,  ROUTINE W REFLEX MICROSCOPIC - Abnormal; Notable for the following components:   Color, Urine YELLOW (*)    APPearance CLEAR (*)    All other components within normal limits  TROPONIN I (HIGH SENSITIVITY) - Abnormal; Notable for the following components:   Troponin I (High Sensitivity) 28 (*)    All other components within normal limits  TROPONIN I (HIGH SENSITIVITY) - Abnormal; Notable for the following components:   Troponin I (High Sensitivity) 25 (*)    All other components within normal limits  PROTIME-INR  APTT  URINE DRUG SCREEN, QUALITATIVE ([facility] ONLY)  ETHANOL  VITAMIN B12     EKG  ED ECG REPORT I, Dionne Bucy, the attending physician, personally viewed and interpreted this ECG.  Date: 08/05/2023 EKG Time: 1238 Rate: 72 Rhythm: normal sinus rhythm QRS Axis: Left axis Intervals: normal ST/T Wave abnormalities: Nonspecific ST abnormalities anterior lateral Narrative Interpretation: Nonspecific  abnormalities with no evidence of acute ischemia; no significant change when compared to EKG of 02/17/2023    RADIOLOGY  CT head: I independently viewed and interpreted the images; there is a left thalamic hyperdensity concerning for hemorrhage  PROCEDURES:  Critical Care performed: Yes  .Critical Care  Performed by: Dionne Bucy, MD Authorized by: Dionne Bucy, MD   Critical care provider statement:    Critical care time (minutes):  45   Critical care time was exclusive of:  Separately billable procedures and treating other patients   Critical care was necessary to treat or prevent imminent or life-threatening deterioration of the following conditions:  CNS failure or compromise   Critical care was time spent personally by me on the following activities:  Development of treatment plan with patient or surrogate, discussions with consultants, evaluation of patient's response to treatment, examination of patient, ordering and review of laboratory studies, ordering and review of radiographic studies, ordering and performing treatments and interventions, pulse oximetry, re-evaluation of patient's condition, review of old charts and obtaining history from patient or surrogate    MEDICATIONS ORDERED IN ED: Medications  nicardipine (CARDENE) 20mg  in 0.86% saline IV infusion (0.1 mg/ml) (15 mg/hr Intravenous New Bag/Given 08/05/23 1522)   stroke: early stages of recovery book (has no administration in time range)  acetaminophen (TYLENOL) tablet 650 mg (has no administration in time range)    Or  acetaminophen (TYLENOL) 160 MG/5ML solution 650 mg (has no administration in time range)    Or  acetaminophen (TYLENOL) suppository 650 mg (has no administration in time range)  senna-docusate (Senokot-S) tablet 1 tablet (has no administration in time range)  pantoprazole (PROTONIX) injection 40 mg (has no administration in time range)  labetalol (NORMODYNE) injection 10 mg (has no  administration in time range)  labetalol (NORMODYNE) injection 10 mg (10 mg Intravenous Given 08/05/23 1433)     IMPRESSION / MDM / ASSESSMENT AND PLAN / ED COURSE  I reviewed the triage vital signs and the nursing notes.  62 year old male with PMH as noted above presents with elevated blood pressure as well as intermittent confusion, memory loss, and generally feeling unwell over approximately last week.  On exam, his blood pressure is significantly elevated.  Other vital signs are normal.  Neurologic exam is nonfocal.  The patient is oriented x 3 although appears mildly confused and/or memory impaired.  Differential diagnosis includes, but is not limited to, acute CVA, hypertensive emergency, ACS, electrolyte abnormality, AKI, other metabolic disturbance.  Patient's presentation is most consistent with acute presentation with potential threat to life or bodily  function.  The patient is on the cardiac monitor to evaluate for evidence of arrhythmia and/or significant heart rate changes.  CT head shows a 1.3 cm likely intraparenchymal hemorrhage in the left thalamus with local mass effect.  BMP shows creatinine slightly elevated from baseline.  Initial troponin is minimally elevated.  I have ordered a nicardipine drip for blood pressure control with a goal systolic of 140.  I consulted and discussed the case with Dr. Iver Nestle from neurology who will come to evaluate the patient.  She recommends an MRI for further evaluation.  ----------------------------------------- 3:46 PM on 08/05/2023 -----------------------------------------  Blood pressure is significantly improved.  The patient remains alert and with a stable neurology exam.  Dr. Iver Nestle is awaiting the results of the MRI to determine the patient's disposition and whether he will be appropriate for admission here.  I have signed the patient out to the oncoming ED physician Dr. Arnoldo Morale.    FINAL CLINICAL IMPRESSION(S) / ED DIAGNOSES    Final diagnoses:  Hypertensive emergency  Intracranial hemorrhage (HCC)     Rx / DC Orders   ED Discharge Orders     None        Note:  This document was prepared using Dragon voice recognition software and may include unintentional dictation errors.    Dionne Bucy, MD 08/05/23 [phone]

## 2023-08-05 NOTE — Consult Note (Signed)
Neurology Consultation Reason for Consult: c/f ICH  Requesting Physician: Dionne Bucy  CC: Confusion  History is obtained from: Patient and chart review, son via phone  HPI: Edwin Green is a 62 y.o. male with a past medical history significant for uncontrolled hypertension, medication nonadherence, CKD stage IIIb, daily alcohol use, THC use, hypertensive cardiomyopathy, quarter pack per day smoking.   He reports that a week ago he had sudden onset difficulty with his speech and some confusion and for example difficulty using his phone and difficulty with numbers as well as some right hand weakness.  He did not notice his right facial droop but he reports he does not look in the mirror often.  He feels like his symptoms have actually been improving over the course of the last few days but given persistent symptoms he came for evaluation and head CT revealed likely subacute intraparenchymal hemorrhage in the left basal ganglia for which neurology is consulted  He reports he lives with a roommate, but does not have his phone with him for his roommate's phone number  Son last spoke to him at the end of the month, reports that his father had been briefly living with him but they had moved away recently and he is not sure that his father has been taking his medications as prescribed recently.  Patient did report to me that he has been out of his medications  LKW: 1 week ago Thrombolytic given?: No, concern for ICH IA performed?: No, exam not consistent with LVO Premorbid modified rankin scale:      0 - No symptoms.  ICH Score:   Time performed: 2:30 PM GCS: 13-15 is 0 points Infratentorial: No.. If yes, 1 point -- 0  Volume: <30cc is 0 points  Age: 62 y.o.. >80 is 1 point -- 0  Intraventricular extension is 1 point -- 0  Score:0 A Score of 0 points has a 30 day mortality of 0%. Stroke. 2001 Apr;32(4):891-7.  ROS: All other review of systems was negative except as noted in the  HPI, with caveat of mild aphasia  Past Medical History:  Diagnosis Date   Arthritis    CKD (chronic kidney disease), stage II    Depression    Epistaxis    Hypertension    Hypertrophic cardiomyopathy (HCC)    a. TTE 7/20 - 60 to 65%, normal LV cavity size, severe concentric LVH with near cavity obliteration in systole, diastolic dysfunction, normal RV systolic function, normal RV cavity size, dilated aortic root measuring 4 cm, no significant valvular abnormality   Past Surgical History:  Procedure Laterality Date   NO PAST SURGERIES     Current Outpatient Medications  Medication Instructions   amLODipine (NORVASC) 10 mg, Oral, Daily   carvedilol (COREG) 37.5 mg, Oral, 2 times daily with meals   hydrALAZINE (APRESOLINE) 50 mg, Oral, 3 times daily   losartan (COZAAR) 100 mg, Oral, Daily     Family History  Problem Relation Age of Onset   Hypertension Mother    Stroke Father    Hypertension Father    Asthma Son     Social History:  reports that he has been smoking cigarettes. He has never used smokeless tobacco. He reports current alcohol use. He reports that he does not use drugs.   Exam: Current vital signs: BP (!) 193/125   Pulse 73   Temp 98.4 F (36.9 C)   Resp 18   Ht 6\' 1"  (1.854 m)   Wt 82.1 kg  SpO2 99%   BMI 23.88 kg/m  Vital signs in last 24 hours: Temp:  [98.1 F (36.7 C)-98.4 F (36.9 C)] 98.4 F (36.9 C) (08/20 1233) Pulse Rate:  [64-73] 73 (08/20 1400) Resp:  [12-20] 18 (08/20 1400) BP: (136-232)/(78-202) 193/125 (08/20 1400) SpO2:  [98 %-100 %] 99 % (08/20 1400) Weight:  [82.1 kg] 82.1 kg (08/20 1233)   Physical Exam  Constitutional: Appears well-developed and well-nourished.  Psych: Affect calm and cooperative Eyes: No scleral injection HENT: No oropharyngeal obstruction.  MSK: no major joint deformities.  Cardiovascular: Normal rate and regular rhythm. Perfusing extremities well Respiratory: Effort normal, non-labored breathing GI:  Soft.  No distension. There is no tenderness.  Skin: Warm dry and intact visible skin  Neuro: Mental Status: Patient is awake, alert, oriented to person, place, initially reports the month of September and his age is 62 but then self corrects to 62.  Later is able to retain that I told him it is August and he is 62 years old. Limited history secondary to mild aphasia.  No neglect Cranial Nerves: II: Visual Fields are full. Pupils are equal, round, and reactive to light.   III,IV, VI: EOMI without ptosis or diploplia.  Possible very subtle left gaze preference not obvious enough to score on NIH V: Facial sensation is symmetric to light touch VII: Facial movement is notable for right facial droop VIII: hearing is intact to voice X: Uvula elevates symmetrically XI: Shoulder shrug is symmetric. XII: tongue is midline without atrophy or fasciculations.  Motor: Tone is normal. Bulk is normal.  5/5 except for right finger extension which is 4/5.  He does have a right upper extremity pronator drift when his eyes are closed (pronation without drift with eyes open). Sensory: Sensation is symmetric to light touch in the arms and legs. Deep Tendon Reflexes: 3+ and symmetric in the brachioradialis and patellae.  Cerebellar: FNF and HKS are intact bilaterally Gait:  Deferred   NIHSS total 6 Score breakdown: 2 points for incorrect month and age, 1 point for right facial droop, 1 point for right arm drift, 1 point for mild aphasia, 1 point for mild dysarthria Performed at 2:15 PM  I have reviewed labs in epic and the results pertinent to this consultation are:  Basic Metabolic Panel: Recent Labs  Lab 08/05/23 1235  NA 135  K 3.6  CL 104  CO2 23  GLUCOSE 95  BUN 25*  CREATININE 2.01*  CALCIUM 8.6*    CBC: Recent Labs  Lab 08/05/23 1235  WBC 3.6*  HGB 13.5  HCT 40.3  MCV 90.8  PLT 228    Coagulation Studies: No results for input(s): "LABPROT", "INR" in the last 72 hours.    Lab Results  Component Value Date   CHOL 149 04/04/2023   HDL 45.60 04/04/2023   LDLCALC 91 04/04/2023   TRIG 64.0 04/04/2023   CHOLHDL 3 04/04/2023   No results found for: "HGBA1C"   I have reviewed the images obtained:  Head CT personally reviewed, agree with radiology:   Ill defied 1.3 cm area of hyperdensity in the left thalamus, suspicious for acute intraparenchymal hemorrhage. Surrounding edema and mild regional mass effect. Recommend MRI head to further characterize and to evaluate for an etiology (such as infarct).   Impression: This is a 62 year old gentleman with a history of uncontrolled hypertension nonadherent to medications presenting with a intracerebral hemorrhage.  Based on history as well as imaging characteristics I do suspect that this bleed happened  several days ago.  Given low ICH score and improving symptoms most likely appropriate for [facility] admission but pending MRI results may consider admission to [facility] instead.  Due to his very severely uncontrolled hypertension on arrival (232/156) as well as the fact that the timeline of his ICH is felt to be over several days ago, his blood pressure should be more gradually lowered, and treated more along the lines of hypertensive urgency/emergency rather than standard ICH guidelines  Recommendations: # Hemorrhagic stroke, likely hypertensive, possibly underlying mass  or less likely hemorrhagic conversion of ischemic stroke  - Stroke labs HgbA1c, fasting lipid panel - MRI brain w/ and w/o  - MRA of the brain without contrast  - MRA neck w/ and w/o - MRI can serve as a stability scan, however if MRI is significantly delayed, please obtain head CT at approximately 6 hours from initial head CT - Frequent neuro checks, q1hr  - Echocardiogram - No antiplatelets due to ICH - DVT PPx heparin at 24 hrs if stable, SCDs for now - Risk factor modification - Telemetry monitoring - Blood pressure goal initially 150  - 180 / 90 - 120  - PT consult, OT consult, Speech consult when patient stabilized  - Neurology will follow  Son updated via phone, patient indicated that son would be his healthcare decision-maker if needed  Brooke Dare MD-PhD [facility] [phone] [facility] coverage for [facility] is from 8 AM to 4 AM in-house and 4 PM to 8 PM by telephone/video. 8 PM to 8 AM emergent questions or overnight urgent questions should be addressed to Teleneurology On-call or [facility] neurohospitalist; contact information can be found on AMION  Total critical care time: 60 minutes   Critical care time was exclusive of separately billable procedures and treating other patients.   Critical care was necessary to treat or prevent imminent or life-threatening deterioration.   Critical care was time spent personally by me on the following activities: development of treatment plan with patient and/or surrogate as well as nursing, discussions with consultants/primary team, evaluation of patient's response to treatment, examination of patient, obtaining history from patient or surrogate, ordering and performing treatments and interventions, ordering and review of laboratory studies, ordering and review of radiographic studies, and re-evaluation of patient's condition as needed, as documented above.

## 2023-08-05 NOTE — Progress Notes (Signed)
Acute Office Visit  Subjective:     Patient ID: Edwin Green, male    DOB: [birth year], 62 y.o.   MRN: [id number]  Chief Complaint  Patient presents with   POSSIBLE STROKE    HPI 62 year old African-American male, patient of Guillerm[place], NP was dropped off to the office today by his employer who had concerns that he was confused, forgetful, with slurred speech.  The employer reports talking to Mr. Worman roommate who reports that he was unable to walk about a week ago and had right-sided weakness.  Reports he was walking down the sliding up against the wall to keep himself upright.  Reports that his normal baseline is outgoing, coherent and a very Chief Executive Officer.  He reports that he has nothing like that at this time.  Patient admits that he had right-sided weakness and has been more confused but hat those symptoms have improved significantly since that time.  He did not seek emergency treatment.  He has been off of all of his medications for about a month.  Today, he continues to have episodes of confusion and forgetfulness.  Father deceased with a brain aneurysm, unknown age.  Mother alive and well as far as he is able to articulate.  He has a son in the area but declined me calling him today. Review of Systems  Constitutional: Negative.   HENT: Negative.    Respiratory: Negative.    Cardiovascular: Negative.   Gastrointestinal: Negative.   Genitourinary: Negative.   Musculoskeletal: Negative.   Skin: Negative.   Neurological:  Negative for tremors, weakness and headaches.       Admits to right-sided weakness that is improved  Endo/Heme/Allergies: Negative.   Psychiatric/Behavioral:  Positive for memory loss.        Unable to tell me where he works  All other systems reviewed and are negative.  Past Medical History:  Diagnosis Date   Arthritis    CKD (chronic kidney disease), stage II    Depression    Epistaxis    Hypertension    Hypertrophic cardiomyopathy (HCC)     a. TTE 7/20 - 60 to 65%, normal LV cavity size, severe concentric LVH with near cavity obliteration in systole, diastolic dysfunction, normal RV systolic function, normal RV cavity size, dilated aortic root measuring 4 cm, no significant valvular abnormality    Social History   Socioeconomic History   Marital status: Single    Spouse name: Not on file   Number of children: Not on file   Years of education: Not on file   Highest education level: Not on file  Occupational History   Not on file  Tobacco Use   Smoking status: Some Days    Types: Cigarettes   Smokeless tobacco: Never  Substance and Sexual Activity   Alcohol use: Yes   Drug use: No   Sexual activity: Not Currently  Other Topics Concern   Not on file  Social History Narrative   Not on file   Social Determinants of Health   Financial Resource Strain: Not on file  Food Insecurity: No Food Insecurity (02/17/2023)   Hunger Vital Sign    Worried About Running Out of Food in the Last Year: Never true    Ran Out of Food in the Last Year: Never true  Transportation Needs: No Transportation Needs (02/17/2023)   PRAPARE - Administrator, Civil Service (Medical): No    Lack of Transportation (Non-Medical): No  Physical Activity: Not on  file  Stress: Not on file  Social Connections: Not on file  Intimate Partner Violence: Not At Risk (02/17/2023)   Humiliation, Afraid, Rape, and Kick questionnaire    Fear of Current or Ex-Partner: No    Emotionally Abused: No    Physically Abused: No    Sexually Abused: No    Past Surgical History:  Procedure Laterality Date   NO PAST SURGERIES      Family History  Problem Relation Age of Onset   Hypertension Mother    Stroke Father    Hypertension Father    Asthma Son     No Known Allergies  Current Outpatient Medications on File Prior to Visit  Medication Sig Dispense Refill   amLODipine (NORVASC) 10 MG tablet Take 1 tablet (10 mg total) by mouth daily. 90 tablet  1   carvedilol (COREG) 12.5 MG tablet Take 3 tablets (37.5 mg total) by mouth 2 (two) times daily with a meal. 540 tablet 1   hydrALAZINE (APRESOLINE) 50 MG tablet Take 1 tablet (50 mg total) by mouth 3 (three) times daily. 270 tablet 1   losartan (COZAAR) 100 MG tablet Take 1 tablet (100 mg total) by mouth daily. 90 tablet 1   No current facility-administered medications on file prior to visit.    BP 136/78   Pulse 71   Temp 98.1 F (36.7 C) (Oral)   Ht 6\' 1"  (1.854 m)   Wt 181 lb (82.1 kg)   SpO2 98%   BMI 23.88 kg/m chart      Objective:    BP 136/78   Pulse 71   Temp 98.1 F (36.7 C) (Oral)   Ht 6\' 1"  (1.854 m)   Wt 181 lb (82.1 kg)   SpO2 98%   BMI 23.88 kg/m    Physical Exam Vitals and nursing note reviewed.  Constitutional:      Appearance: Normal appearance. He is normal weight.  Eyes:     Extraocular Movements: Extraocular movements intact.     Conjunctiva/sclera: Conjunctivae normal.     Pupils: Pupils are equal, round, and reactive to light.  Cardiovascular:     Rate and Rhythm: Normal rate and regular rhythm.  Pulmonary:     Effort: Pulmonary effort is normal.     Breath sounds: Normal breath sounds.  Abdominal:     General: Abdomen is flat. Bowel sounds are normal.     Palpations: Abdomen is soft.  Musculoskeletal:        General: Normal range of motion.     Cervical back: Normal range of motion and neck supple.  Skin:    General: Skin is warm and dry.  Neurological:     General: No focal deficit present.     Mental Status: He is alert. He is confused.     Cranial Nerves: No facial asymmetry.     Motor: No weakness, tremor, abnormal muscle tone or pronator drift.     Gait: Gait is intact.     Comments: Confused at times and unable to complete his thoughts  Psychiatric:        Mood and Affect: Mood normal.        Behavior: Behavior normal.     No results found for any visits on 08/05/23.      Assessment & Plan:   Problem List Items  Addressed This Visit   None Visit Diagnoses     Slurred speech    -  Primary   Confused  Right sided weakness            Consultation with Dr. Jacquelyne Balint, neurologist suggest that we send him to the emergency department for further workup.   Also discussed this case with Dr. Birdie Sons, internal medicine.  Nonemergency EMS called today and report was provided.  Patient left via EMS.  No follow-ups on file.  Eulis Foster, FNP

## 2023-08-05 NOTE — Progress Notes (Signed)
Confirmed BP parameters with MD Bhagat: SBP 150-180 / DBP 90-120.

## 2023-08-05 NOTE — ED Notes (Signed)
Meal tray given to patient at this time. Call light within reach

## 2023-08-05 NOTE — ED Notes (Addendum)
Pt transported to MRI. Float RN accompanied patient.

## 2023-08-05 NOTE — ED Notes (Signed)
Neurologist at bedside. Provider aware of Cardene at 15mg /hr and BP remaining hypertensive.

## 2023-08-05 NOTE — ED Notes (Signed)
MRI called to speak with patient.

## 2023-08-05 NOTE — ED Provider Notes (Signed)
Care assumed of patient from outgoing provider.  See their note for initial history, exam and plan.  Clinical Course as of 08/05/23 2014  Tue Aug 05, 2023  1502 HTN and out of medications recently - CT head w/ thalamic hemorrhage. Cardene gtt. MR pending. Neuro dispo.  [SM]    Clinical Course User Index [SM] Corena Herter, MD   Discussed with neurology after MRIs, recommended admission to [facility] to the hospitalist for stable head bleed on Cardene infusion.   Corena Herter, MD 08/05/23 2014

## 2023-08-05 NOTE — ED Notes (Signed)
X-ray at bedside

## 2023-08-05 NOTE — ED Notes (Signed)
Neurologist remains at bedside

## 2023-08-05 NOTE — ED Notes (Signed)
Pt returned from MRI °

## 2023-08-05 NOTE — Consult Note (Signed)
Consulting Department:  Emergency department  Primary Physician:  Bethanie Dicker, NP  Chief Complaint: Intracranial hemorrhage  History of Present Illness: 08/05/2023 Zeric Paddock is a 62 y.o. male who presents with the chief complaint of intracranial hemorrhage.  He has a significant history of hypertension, kidney disease, alcohol use, smoking.  He states that he developed onset of slurred speech and some confusion approximately 1 week ago.  He feels like his speech has been slowly improving over the last week.  He presented to the emergency department for some of the slurring.  Was found to have an intraparenchymal hemorrhage in the basal ganglia also neurosurgery was consulted  Review of Systems:  Neurologic review of history/systems negative unless otherwise specified above  Past Medical History: Past Medical History:  Diagnosis Date   Arthritis    CKD (chronic kidney disease), stage II    Depression    Epistaxis    Hypertension    Hypertrophic cardiomyopathy (HCC)    a. TTE 7/20 - 60 to 65%, normal LV cavity size, severe concentric LVH with near cavity obliteration in systole, diastolic dysfunction, normal RV systolic function, normal RV cavity size, dilated aortic root measuring 4 cm, no significant valvular abnormality    Past Surgical History: Past Surgical History:  Procedure Laterality Date   NO PAST SURGERIES      Allergies: Allergies as of 08/05/2023   (No Known Allergies)    Medications:  Current Facility-Administered Medications:    [START ON 08/06/2023]  stroke: early stages of recovery book, , Does not apply, Once, Bhagat, Srishti L, MD   acetaminophen (TYLENOL) tablet 650 mg, 650 mg, Oral, Q4H PRN **OR** acetaminophen (TYLENOL) 160 MG/5ML solution 650 mg, 650 mg, Per Tube, Q4H PRN **OR** acetaminophen (TYLENOL) suppository 650 mg, 650 mg, Rectal, Q4H PRN, Bhagat, Srishti L, MD   labetalol (NORMODYNE) injection 10 mg, 10 mg, Intravenous, Q1H PRN, Bhagat,  Srishti L, MD   nicardipine (CARDENE) 20mg  in 0.86% saline IV infusion (0.1 mg/ml), 3-15 mg/hr, Intravenous, Continuous, Bhagat, Srishti L, MD, Last Rate: 75 mL/hr at 08/05/23 1634, 7.5 mg/hr at 08/05/23 1634   pantoprazole (PROTONIX) injection 40 mg, 40 mg, Intravenous, QHS, Bhagat, Srishti L, MD   senna-docusate (Senokot-S) tablet 1 tablet, 1 tablet, Oral, BID, Bhagat, Srishti L, MD  Current Outpatient Medications:    amLODipine (NORVASC) 10 MG tablet, Take 1 tablet (10 mg total) by mouth daily., Disp: 90 tablet, Rfl: 1   carvedilol (COREG) 12.5 MG tablet, Take 3 tablets (37.5 mg total) by mouth 2 (two) times daily with a meal., Disp: 540 tablet, Rfl: 1   hydrALAZINE (APRESOLINE) 50 MG tablet, Take 1 tablet (50 mg total) by mouth 3 (three) times daily., Disp: 270 tablet, Rfl: 1   losartan (COZAAR) 100 MG tablet, Take 1 tablet (100 mg total) by mouth daily., Disp: 90 tablet, Rfl: 1   Social History: Social History   Tobacco Use   Smoking status: Some Days    Types: Cigarettes   Smokeless tobacco: Never  Substance Use Topics   Alcohol use: Yes   Drug use: No    Family Medical History: Family History  Problem Relation Age of Onset   Hypertension Mother    Stroke Father    Hypertension Father    Asthma Son     Physical Examination: Vitals:   08/05/23 1635 08/05/23 1640  BP: (!) 159/101 (!) 148/96  Pulse: 87 87  Resp: 16 15  Temp:    SpO2:  General: Patient is well developed, well nourished, calm, collected, and in no apparent distress.  NEUROLOGICAL:  General: In no acute distress.   Some communication is altered by presence of a aphasia.  Has some substitution errors.  Pupils equal round and reactive to light.  Full Facial tone demonstrates a slight facial droop..  Tongue protrusion is midline.  Bilateral upper extremities are full strength proximally and distally to confrontation, however there is a very minor pronator drift  GCS: 15   Bilateral upper  and lower extremity sensation is intact to light touch.  Imaging: Narrative & Impression  CLINICAL DATA:  Headache, neuro deficit   EXAM: CT HEAD WITHOUT CONTRAST   TECHNIQUE: Contiguous axial images were obtained from the base of the skull through the vertex without intravenous contrast.   RADIATION DOSE REDUCTION: This exam was performed according to the departmental dose-optimization program which includes automated exposure control, adjustment of the mA and/or kV according to patient size and/or use of iterative reconstruction technique.   COMPARISON:  None Available.   FINDINGS: Brain: Ill defied 1.3 cm area of hyperdensity in the left thalamus, suspicious for acute intraparenchymal hemorrhage. There is surrounding edema and mild regional mass effect. No evidence of hydrocephalus or extra-axial fluid collection.   Vascular: No hyperdense vessel.   Skull: No acute fracture.   Sinuses/Orbits: Clear sinuses.  No acute orbital findings.   IMPRESSION: Ill defied 1.3 cm area of hyperdensity in the left thalamus, suspicious for acute intraparenchymal hemorrhage. Surrounding edema and mild regional mass effect. Recommend MRI head to further characterize and to evaluate for an etiology (such as infarct).   Code stroke imaging results were communicated on 08/05/2023 at 1:25 pm to provider PADUCHOWSKI via telephone, who verbally acknowledged these results.     Electronically Signed   By: Feliberto Harts M.D.   On: 08/05/2023 13:26    I have personally reviewed the images and agree with the above interpretation.  Labs:    Latest Ref Rng & Units 08/05/2023   12:35 PM 04/04/2023   11:04 AM 02/19/2023    5:56 AM  CBC  WBC 4.0 - 10.5 K/uL 3.6  4.5  4.6   Hemoglobin 13.0 - 17.0 g/dL 29.5  62.1  30.8   Hematocrit 39.0 - 52.0 % 40.3  38.3  30.7   Platelets 150 - 400 K/uL 228  254.0  147        Assessment and Plan: Mr. Roedl is a pleasant 62 y.o. male with 1 week  history of slurred speech and some very minor weakness.  He presented to the emergency department.  At this point he is not having significant head pain.  He does have a history of uncontrolled hypertension, smoking, and diabetes.  On imaging he demonstrates a small basal ganglia located hemorrhage with some Perry hemorrhage edema.  Given that his symptoms started approximately a week ago we feel that this is likely secondary to maturation of the blood products rather than presence of a mass lesion.  We do feel he could benefit from an MRI to evaluate for mass lesion.  At this point we will defer management to neurology for hemorrhagic stroke management.  ICH score is 0 for low volume bleed, supratentorial origin, no intraventricular blood, age of 85 and GCS of 15.  Please reach out if you have any further questions.  Will follow-up on the MRI imaging to evaluate whether or not there is underlying mass lesion.   Lovenia Kim, MD/MSCR Dept.  of Neurosurgery

## 2023-08-05 NOTE — ED Notes (Signed)
VO to target SBP less than 180 and DBP less than 120

## 2023-08-06 ENCOUNTER — Inpatient Hospital Stay: Admit: 2023-08-06 | Payer: No Typology Code available for payment source

## 2023-08-06 ENCOUNTER — Inpatient Hospital Stay
Admit: 2023-08-06 | Discharge: 2023-08-06 | Disposition: A | Discharge status: Home / Self Care | Payer: No Typology Code available for payment source | Attending: Neurology

## 2023-08-06 DIAGNOSIS — I6389 Other cerebral infarction: Secondary | ICD-10-CM

## 2023-08-06 DIAGNOSIS — I629 Nontraumatic intracranial hemorrhage, unspecified: Secondary | ICD-10-CM

## 2023-08-06 DIAGNOSIS — I619 Nontraumatic intracerebral hemorrhage, unspecified: Secondary | ICD-10-CM | POA: Diagnosis not present

## 2023-08-06 LAB — PHOSPHORUS: Phosphorus: 2.9 mg/dL (ref 2.5–4.6)

## 2023-08-06 LAB — BASIC METABOLIC PANEL
Anion gap: 3 — ABNORMAL LOW (ref 5–15)
BUN: 20 mg/dL (ref 6–20)
CO2: 22 mmol/L (ref 22–32)
Calcium: 8.1 mg/dL — ABNORMAL LOW (ref 8.9–10.3)
Chloride: 112 mmol/L — ABNORMAL HIGH (ref 98–111)
Creatinine, Ser: 1.68 mg/dL — ABNORMAL HIGH (ref 0.61–1.24)
GFR, Estimated: 47 mL/min — ABNORMAL LOW (ref >60)
Glucose, Bld: 101 mg/dL — ABNORMAL HIGH (ref 70–99)
Potassium: 3.6 mmol/L (ref 3.5–5.1)
Sodium: 137 mmol/L (ref 135–145)

## 2023-08-06 LAB — ECHOCARDIOGRAM COMPLETE BUBBLE STUDY
AR max vel: 4.23 cm2
AV Area VTI: 4.94 cm2
AV Area mean vel: 4.22 cm2
AV Mean grad: 3.5 mmHg
AV Peak grad: 6.2 mmHg
Ao pk vel: 1.24 m/s
Area-P 1/2: 4.74 cm2
MV VTI: 3.3 cm2
S' Lateral: 2 cm

## 2023-08-06 LAB — MAGNESIUM: Magnesium: 2.1 mg/dL (ref 1.7–2.4)

## 2023-08-06 LAB — LIPID PANEL
Cholesterol: 145 mg/dL (ref 0–200)
HDL: 40 mg/dL — ABNORMAL LOW (ref >40)
LDL Cholesterol: 95 mg/dL (ref 0–99)
Total CHOL/HDL Ratio: 3.6 RATIO
Triglycerides: 48 mg/dL (ref <150)
VLDL: 10 mg/dL (ref 0–40)

## 2023-08-06 LAB — CBC
HCT: 40.2 % (ref 39.0–52.0)
Hemoglobin: 13.6 g/dL (ref 13.0–17.0)
MCH: 30.1 pg (ref 26.0–34.0)
MCHC: 33.8 g/dL (ref 30.0–36.0)
MCV: 88.9 fL (ref 80.0–100.0)
Platelets: 228 10*3/uL (ref 150–400)
RBC: 4.52 MIL/uL (ref 4.22–5.81)
RDW: 13 % (ref 11.5–15.5)
WBC: 3.3 10*3/uL — ABNORMAL LOW (ref 4.0–10.5)
nRBC: 0 % (ref 0.0–0.2)

## 2023-08-06 LAB — MRSA NEXT GEN BY PCR, NASAL: MRSA by PCR Next Gen: NOT DETECTED

## 2023-08-06 LAB — HEMOGLOBIN A1C
Hgb A1c MFr Bld: 5.7 % — ABNORMAL HIGH (ref 4.8–5.6)
Mean Plasma Glucose: 116.89 mg/dL

## 2023-08-06 LAB — GLUCOSE, CAPILLARY: Glucose-Capillary: 167 mg/dL — ABNORMAL HIGH (ref 70–99)

## 2023-08-06 LAB — VITAMIN B12: Vitamin B-12: 785 pg/mL (ref 180–914)

## 2023-08-06 LAB — ETHANOL: Alcohol, Ethyl (B): <10 mg/dL (ref <10)

## 2023-08-06 MED ORDER — ADULT MULTIVITAMIN W/MINERALS CH
1.0000 | ORAL_TABLET | Freq: Every day | ORAL | Status: DC
  Administered 2023-08-06 – 2023-08-09 (×4): 1 via ORAL
  Filled 2023-08-06 (×3): qty 1

## 2023-08-06 MED ORDER — THIAMINE MONONITRATE 100 MG PO TABS
100.0000 mg | ORAL_TABLET | Freq: Every day | ORAL | Status: DC
  Administered 2023-08-06 – 2023-08-09 (×4): 100 mg via ORAL
  Filled 2023-08-06 (×4): qty 1

## 2023-08-06 MED ORDER — CHLORDIAZEPOXIDE HCL 25 MG PO CAPS: 25.0000 mg | ORAL_CAPSULE | Freq: Four times a day (QID) | ORAL | Status: AC | PRN

## 2023-08-06 MED ORDER — THIAMINE HCL 100 MG/ML IJ SOLN: 100.0000 mg | Freq: Every day | INTRAMUSCULAR | Status: DC

## 2023-08-06 MED ORDER — FOLIC ACID 1 MG PO TABS
1.0000 mg | ORAL_TABLET | Freq: Every day | ORAL | Status: DC
  Administered 2023-08-06 – 2023-08-09 (×4): 1 mg via ORAL
  Filled 2023-08-06 (×4): qty 1

## 2023-08-06 MED ORDER — HYDRALAZINE HCL 50 MG PO TABS
50.0000 mg | ORAL_TABLET | Freq: Three times a day (TID) | ORAL | Status: DC
  Administered 2023-08-06 – 2023-08-08 (×6): 50 mg via ORAL
  Filled 2023-08-06 (×6): qty 1

## 2023-08-06 MED ORDER — CHLORHEXIDINE GLUCONATE CLOTH 2 % EX PADS
6.0000 | MEDICATED_PAD | Freq: Every day | CUTANEOUS | Status: DC
  Administered 2023-08-06 – 2023-08-08 (×4): 6 via TOPICAL

## 2023-08-06 MED ORDER — AMLODIPINE BESYLATE 10 MG PO TABS
10.0000 mg | ORAL_TABLET | Freq: Every day | ORAL | Status: DC
  Administered 2023-08-06 – 2023-08-09 (×4): 10 mg via ORAL
  Filled 2023-08-06 (×4): qty 1

## 2023-08-06 MED ORDER — LOSARTAN POTASSIUM 50 MG PO TABS
100.0000 mg | ORAL_TABLET | Freq: Every day | ORAL | Status: DC
  Administered 2023-08-06 – 2023-08-09 (×4): 100 mg via ORAL
  Filled 2023-08-06 (×4): qty 2

## 2023-08-06 MED ORDER — ATORVASTATIN CALCIUM 20 MG PO TABS
10.0000 mg | ORAL_TABLET | Freq: Every day | ORAL | Status: DC
  Administered 2023-08-06 – 2023-08-08 (×3): 10 mg via ORAL
  Filled 2023-08-06 (×4): qty 1

## 2023-08-06 NOTE — Progress Notes (Signed)
*  PRELIMINARY RESULTS* Echocardiogram 2D Echocardiogram has been performed.  Edwin Green 08/06/2023, 9:53 AM

## 2023-08-06 NOTE — Evaluation (Signed)
Physical Therapy Evaluation Patient Details Name: Edwin Green MRN: [id number] DOB: [birth year] Today's Date: 08/06/2023  History of Present Illness  62 y.o. male with a past medical history significant for uncontrolled hypertension, medication nonadherence, CKD stage IIIb, daily alcohol use, THC use, hypertensive cardiomyopathy, quarter pack per day smoking.      He reports that a week ago he had sudden onset difficulty with his speech and some confusion and for example difficulty using his phone and difficulty with numbers as well as some right hand weakness.  He did not notice his right facial droop but he reports he does not look in the mirror often.  He feels like his symptoms have actually been improving over the course of the last few days but given persistent symptoms he came for evaluation and head CT revealed likely subacute intraparenchymal hemorrhage in the left basal ganglia f  Clinical Impression  Pt was pleasant and motivated with PT, however showed general flat affect and struggled to give much background information/fully answer basic PLOF questions.  He generally showed good strength and mobility, though he did have some R UE coordination deficits.  Pt was able to rise to standing and ambulate ~200 ft w/o need for physical assist, decreased speed/cadence w/o AD but able to ambulate ~134ft w/o AD.  Pt states he lives alone, documentation indicates roommate.  Pt reports feeling better than he did at start of symptoms ~1 week ago but endorses not being at baseline though he is not able to elaborate more on this and does show some word finding and memory deficits.  Overall moving reasonably well, but will continue to benefit from skilled PT to address functional limitations and deviation from PLOF.        If plan is discharge home, recommend the following: Assist for transportation   Can travel by private vehicle        Equipment Recommendations None recommended by PT   Recommendations for Other Services       Functional Status Assessment       Precautions / Restrictions Precautions Precautions:  (mod fall risk) Restrictions Weight Bearing Restrictions: No      Mobility  Bed Mobility Overal bed mobility: Modified Independent             General bed mobility comments: Easily gets to sitting EOB w/o assist    Transfers Overall transfer level: Modified independent Equipment used: Rolling walker (2 wheels)               General transfer comment: Pt able to rise to standing with only supervision, not hesitancy or overt unsteadiness with transition    Ambulation/Gait Ambulation/Gait assistance: Supervision Gait Distance (Feet): 200 Feet Assistive device: Rolling walker (2 wheels), None         General Gait Details: ~50 ft with RW, then transition to light single hallway rail use and then final 100+ ft w/o UE use.  Pt with slow but consistent cadence, no LOBs, minimal c/o fatigue but slower and more hesitant than baseline.  Stairs            Wheelchair Mobility     Tilt Bed    Modified Rankin (Stroke Patients Only)       Balance Overall balance assessment: Needs assistance Sitting-balance support: No upper extremity supported Sitting balance-Leahy Scale: Normal     Standing balance support: No upper extremity supported Standing balance-Leahy Scale: Good Standing balance comment: SLS <5 sec b/l, maintains tandem ~10 seconds, EC NBOS with mild  sway but able to self correct.  Minimal defecits w/o overt safety concerns.                             Pertinent Vitals/Pain Pain Assessment Pain Assessment: No/denies pain    Home Living                          Prior Function                       Extremity/Trunk Assessment                Communication      Cognition Arousal: Alert Behavior During Therapy: Flat affect Overall Cognitive Status: Impaired/Different from  baseline                                 General Comments: Pt unable to fully answer some questions, states "August or September 2004" and unable to correct with light cuing.  Consistently vague regarding home set up and activity level questions        General Comments General comments (skin integrity, edema, etc.): Pt displays good R UE strength with mild coordination defecits, unsure of baseline cognition but has some minimal confusion and inability to maintain appropriate/consistent conversation that is, apparently, improved since onset of symptoms    Exercises     Assessment/Plan    PT Assessment    PT Problem List         PT Treatment Interventions      PT Goals (Current goals can be found in the Care Plan section)       Frequency Min 1X/week     Co-evaluation               AM-PAC PT "6 Clicks" Mobility  Outcome Measure Help needed turning from your back to your side while in a flat bed without using bedrails?: None Help needed moving from lying on your back to sitting on the side of a flat bed without using bedrails?: None Help needed moving to and from a bed to a chair (including a wheelchair)?: None Help needed standing up from a chair using your arms (e.g., wheelchair or bedside chair)?: None Help needed to walk in hospital room?: A Little Help needed climbing 3-5 steps with a railing? : A Little 6 Click Score: 22    End of Session Equipment Utilized During Treatment: Gait belt Activity Tolerance: Patient tolerated treatment well;Patient limited by fatigue (HR up to ~100 with mild c/o fatigue) Patient left: with call bell/phone within reach Nurse Communication: Mobility status PT Visit Diagnosis: Other symptoms and signs involving the nervous system (R29.898);Difficulty in walking, not elsewhere classified (R26.2)    Time: 1610-9604 PT Time Calculation (min) (ACUTE ONLY): 27 min   Charges:   PT Evaluation $PT Eval Low Complexity: 1  Low PT Treatments $Gait Training: 8-22 mins PT General Charges $$ ACUTE PT VISIT: 1 Visit         Malachi Pro, DPT 08/06/2023, 10:20 AM

## 2023-08-06 NOTE — Progress Notes (Signed)
NAME:  Edwin Green, MRN:  [id number], DOB:  May 15, 1961, LOS: 1 ADMISSION DATE:  08/05/2023, CONSULTATION DATE: 08/05/2023 REFERRING MD: Dr. Arnoldo Morale, CHIEF COMPLAINT: Hypertension, confusion, slurred speech  History of Present Illness:  62 year old male presenting to [facility] ED from PCP office via EMS on 08/05/2023 for evaluation of hypertension in the setting of difficulty with speech and intermittent confusion.  History obtained per chart review and bedside interview with patient Patient reports that a week ago he had sudden onset difficulty with his speech and some confusion.  His example was extreme difficulty using his phone, difficulty with numbers and right-handed weakness.  He explained he believes his symptoms have somewhat improved over the last few days.  The patient's son last spoke to him at the end of the month reporting that his father had been briefly living with him but they had moved away recently and he is not sure if his father has been taking his medications as prescribed recently. The patient was dropped off at his PCP office by his employer who had concerns that the patient was confused, forgetful with slurred speech.  The employer also spoke with the patient's roommate who reports that he has been unable to walk for about a week and had right-sided weakness.  This was described as using the wall to keep himself upright while attempting to walk.  Patient confirmed with PCP he has been off of all of his medications for about a month.  EMS was called for transport to ED for evaluation of possible stroke. He reports regular ETOH use 1-4 beers/daily, daily tobacco use 1-2 cigarettes daily since age 23 and regular marijuana use. When asked about his anti-hypertensive medications he stated, "I just messed up". He used the previous prescriptions but once they ran out he did not refill them or follow up with his PCP or see cardiology.  He denied SOB, fever/chills, chest pain, blurred vision/  vision changes/ headache, falls, LOC. However he is inconsistent with symptoms which I suspect is secondary to mild aphasia and intermittent mild confusion.  Of note he was admitted in March 2024 due to nosebleeds from uncontrolled hypertension and he was not on any medications at that time.  He was seen outpatient on 04/04/2023 with plans for follow-up appointment in 2 weeks and referral to cardiology.  He does not appear that he has seen a provider since 04/04/2023 prior to today's earlier visit. ED course: Upon arrival patient alert and responsive with mild confusion/memory impairment.  Vital signs initially stable but upon second check patient in hypertensive urgency with BP 232/156.  This was treated with IV as needed without effect and nicardipine drip was initiated.  CT imaging showed 1.3 cm likely intraparenchymal hemorrhage in the left thalamus with local mass effect.  Neurology was urgently consulted and MRI evaluation ordered to rule out underlying mass.  MRI confirmed thalamic bleed with surrounding edema and mild mass effect no underlying mass.  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  Initial NIHSS 6. Medications given: Labetalol, Protonix, nicardipine drip started, IV contrast Initial Vitals: 98.1, 71, 136/78 and 98% on room air  EKG Interpretation: Date: 08/05/2023, EKG Time: 12:38, Rate: 72, Rhythm: NSR, QRS Axis: LAD Intervals: LVH, prolonged QT, ST/T Wave abnormalities: Lateral T wave inversions, Narrative Interpretation: NSR with signs of LVH and prolonged QT and lateral T wave inversions that are unchanged from previous EKG Chemistry: Na+: 135,  K+: 3.6, BUN/Cr.:  25/2.01 serum CO2/ AG: 23/8 Hematology: WBC: 3.6, Hgb: 13.5,  Troponin: 28 > 25  CXR 08/05/23: small amt of linear scarring of atelectasis at the Left lung base. CT head wo contrast 08/05/23: Ill defied 1.3 cm area of hyperdensity  in the left thalamus, suspicious for acute intraparenchymal hemorrhage. Surrounding edema and mild regional mass effect.  MRI brain w/wo contrast, MRA head & neck 08/05/23: 2.2 cm hematoma centered in the left thalamus, with surrounding edema and mild mass effect on the atrium of the left lateral ventricle and third ventricle. No evidence of active extravasation into the hematoma. No hydrocephalus. No intracranial large vessel occlusion or significant stenosis. No hemodynamically significant stenosis in the neck.  PCCM consulted for admission due to hemorrhagic stroke suspect s/t uncontrolled hypertension on nicardipine drip.  Pertinent  Medical History  Uncontrolled hypertension CKD stage IIIb EtOH use THC use Hypertensive cardiomyopathy Tobacco use   Significant Hospital Events: Including procedures, antibiotic start and stop dates in addition to other pertinent events   08/05/23: Admit to ICU with hemorrhagic stroke suspect s/t uncontrolled hypertension on nicardipine drip, neurology following 8/21 weaned off infusions, BP stable  Interim History / Subjective:  Patient alert and responsive with mild aphasia and dysarthria.  No pain No focal deficits  Objective   Blood pressure (!) 157/113, pulse 80, temperature 98.8 F (37.1 C), temperature source Oral, resp. rate 13, height 6\' 1"  (1.854 m), weight 80.7 kg, SpO2 95%.        Intake/Output Summary (Last 24 hours) at 08/06/2023 0738 Last data filed at 08/06/2023 0700 Gross per 24 hour  Intake 905.05 ml  Output --  Net 905.05 ml   Filed Weights   08/05/23 1233 08/06/23 0240  Weight: 82.1 kg 80.7 kg    Examination: General: Adult male,lying in bed, NAD HEENT: MM pink/moist, anicteric, atraumatic, neck supple Neuro: A&O x 3, able to follow commands, PERRL +3, MAE CN: II - Visual field intact OU III, IV, VI - Extraocular movements intact. V - Facial sensation intact bilaterally. VII - Facial movement intact bilaterally VIII  - Hearing & vestibular intact bilaterally X - Palate elevates symmetrically XI - Chin turning & shoulder shrug intact bilaterally. XII - Tongue protrusion intact Motor Strength - strength 4/5 in RUE, 5/5 in other extremities and pronator drift was absent.  Sensory - Light touch was assessed and was symmetrical Coordination - The patient had normal movements in the hands and feet with no ataxia or dysmetria.  Tremor was absent Gait and Station - deferred.  NIHSS Level of  Consciousness: 0 = Alert, keenly responsive; 1 = Not alert, but arousable by minor stimulation to obey, answer, or respond; 2 = Not alert, requires repeated stimulation to attend, or is obtunded and requires strong or painful stimulation to make movements (not stereotyped); 3 = Responds only with reflex motor or autonomic effects or totally unresponsive, flaccid, and areflexic   0  LOC Questions 0 = Answers both questions correctly; 1 = Answers one question correctly; 2 = Answers neither question correctly.  0  LOC Commands 0 = Performs both tasks correctly; 1 = Performs one task correctly; 2 = Performs neither task correctly  0  Best Gaze  0 = Normal; 1 = Partial gaze palsy; gaze is abnormal in one or both eyes, but forced deviation or total gaze paresis is not present; 2 = Forced deviation, or total gaze paresis not overcome by the oculocephalic maneuver   0  Visual  0 = No visual loss; 1 = Partial hemianopia; 2 = Complete hemianopia; 3 = Bilateral hemianopia (blind including cortical blindness).   0  Facial Palsy 0 = Normal symmetrical movements; 1 = Minor paralysis (flattened nasolabial fold, asymmetry on smiling); 2 = Partial paralysis (total or near-total paralysis of lower face); 3 = Complete paralysis of one or both sides (absence of facial movement in the upper and lower face).  0  Motor Arm LEFT 0 = No drift; limb holds 90 (or 45) degrees for full 10 secs; 1 = Drift; limb holds 90 (or 45) degrees, but drifts down before  full 10 seconds; does not hit bed or other support; 2 = Some effort against gravity; limb cannot get to or maintain (if cued) 90 (or 45) degrees, drifts down to bed, but has some effort against gravity; 3 = No effort against gravity; limb falls; 4 = No movement; UN = unable to test (Amputation or joint fusion).   0  Motor Arm RIGHT 0 = No drift; limb holds 90 (or 45) degrees for full 10 secs; 1 = Drift; limb holds 90 (or 45) degrees, but drifts down before full 10 seconds; does not hit bed or other support; 2 = Some effort against gravity; limb cannot get to or maintain (if cued) 90 (or 45) degrees, drifts down to bed, but has some effort against gravity; 3 = No effort against gravity; limb falls; 4 = No movement; UN = unable to test (Amputation or joint fusion).   0  Motor Leg LEFT 0 = No drift; leg holds 30 degree position for full 5 secs; 1 = Drift; leg falls by the end of the 5-sec period but does not hit bed; 2 = Some effort against gravity; leg falls to bed by 5 secs, but has some effort against gravity; 3 = No effort against gravity; leg falls to bed immediately; 4 = No movement; UN = unable to test (Amputation or joint fusion).   0  Motor Leg RIGHT 0 = No drift; leg holds 30 degree position for full 5 secs; 1 = Drift; leg falls by the end of the 5-sec period but does not hit bed; 2 = Some effort against gravity; leg falls to bed by 5 secs, but has some effort against gravity; 3 = No effort against gravity; leg falls to bed immediately; 4 = No movement; UN = unable to test (Amputation or joint fusion).   0  Limb Ataxia 0 = Absent; 1 = Present in one limb; 2 = Present in two limbs; UN = Amputation or joint fusion   0  Sensory  0 = Normal, no sensory loss; 1 = Mild-to-moderate sensory loss, patient feels pinprick is less sharp or is dull on the affected side, or there is a loss of superficial pain with pinprick, but patient is aware of being touched; 2 = Severe to total sensory loss, patient is not aware  of being touched in the face, arm, and leg.   0  Best Language 0 = No aphasia; normal; 1 = Mild-to-moderate aphasia; some obvious loss of fluency or facility of comprehension, w/o significant limitation on ideas expressed or form of expression. Reduction of speech and/or comprehension, however, makes conversation about provided materials difficult or impossible. For example, in conversation about provided materials, examiner can identify picture or naming card content from patient's response; 2 = Severe aphasia; all communication is through fragmentary expression; great need for inference, questioning, and guessing by the listener. Range  of information that can be exchanged is limited; listener carries burden of communication. Examiner cannot identify materials provided from patient response; 3 = Mute, global aphasia; no usable speech or auditory comprehension  1  Dysarthria 0 = Normal; 1 = Mild-to-moderate dysarthria, patient slurs at least some words and, at worst, can be understood with some difficulty; 2 = Severe dysarthria, patient's speech is so slurred as to be unintelligible in the absence of or out of proportion to any dysphasia, or is mute/anarthric; UN = Intubated or other physical barrier  1  Extinction/Inattention 0 = No abnormality 1 = Visual, tactile, auditory, spatial, or personal extinction/inattention to bilateral simultaneous stimulation in one of the sensory modalities. 2 = Profound hemi-inattention or extinction to more than one modality; does not recognize own hand or orients to only one side of space.  0  Total RUE weakness in exam, no drift noted  2      Review of Systems: Gen:  Denies  fever, sweats, chills weight loss  HEENT: Denies blurred vision, double vision, ear pain, eye pain, hearing loss, nose bleeds, sore throat Cardiac:  No dizziness, chest pain or heaviness, chest tightness,edema, No JVD Resp:   No cough, -sputum production, -shortness of breath,-wheezing,  -hemoptysis,  Other:  All other systems negative   Physical Examination:   General Appearance: No distress  EYES PERRLA, EOM intact.   NECK Supple, No JVD Pulmonary: normal breath sounds, No wheezing.  CardiovascularNormal S1,S2.  No m/r/g.   Abdomen: Benign, Soft, non-tender. Neurology UE/LE 5/5 strength, no focal deficits Ext pulses intact, cap refill intact ALL OTHER ROS ARE NEGATIVE   Resolved Hospital Problem list     Assessment & Plan:  Hemorrhagic stroke suspect secondary to uncontrolled hypertension Hypertensive urgency in the setting of uncontrolled hypertension and medication noncompliance PMHx: Hypertrophic cardiomyopathy Underlying mass ruled out with MRI - Neuro checks Q 2 - continuous cardiac monitoring - PT/OT consult - Neurology consulted, appreciate input - Echocardiogram with bubble ordered - f/u Lipid panel & Hgb A1c  -SCDs for VTE prophylaxis, no antiplatelets due to ICH -Blood pressure goal 150-180/90-120, wean nicardipine drip as tolerated, continue labetalol PRN -Consider resuming outpatient antihypertensives as patient stabilizes  Suspected acute Kidney Injury superimposed on CKD stage IIIb  Baseline Cr: 1.87, Cr on admission: 2.01.  However labs at last PCP on 04/04/2023 are consistent with this creatinine - Strict I/O's: alert provider if UOP < 0.5 mL/kg/hr - gentle IVF hydration  - Daily BMP, replace electrolytes PRN - Avoid nephrotoxic agents as able, ensure adequate renal perfusion  Polysubstance Use Daily ETOH & marijuana use, however patient denies any ETOH use this past week - thiamine, folic acid and multi-vitamin ordered - monitor CIWA, prn librium   Tobacco Use - smoking cessation counseling provided - nicotine patch offered   ENDO - ICU hypoglycemic\Hyperglycemia protocol -check FSBS per protocol   GI GI PROPHYLAXIS as indicated  NUTRITIONAL STATUS DIET-->as tolerated Constipation protocol as  indicated   ELECTROLYTES -follow labs as needed -replace as needed -pharmacy consultation and following     Best Practice (right click and "Reselect all SmartList Selections" daily)  Diet/type: Regular consistency (see orders)  DVT prophylaxis: SCD GI prophylaxis: N/A Lines: N/A Foley:  N/A Code Status:  full code Last date of multidisciplinary goals of care discussion [08/05/23]  Labs   CBC: Recent Labs  Lab 08/05/23 1235 08/06/23 0620  WBC 3.6* 3.3*  HGB 13.5 13.6  HCT 40.3 40.2  MCV 90.8 88.9  PLT  228 228    Basic Metabolic Panel: Recent Labs  Lab 08/05/23 1235 08/06/23 0620  NA 135 137  K 3.6 3.6  CL 104 112*  CO2 23 22  GLUCOSE 95 101*  BUN 25* 20  CREATININE 2.01* 1.68*  CALCIUM 8.6* 8.1*  MG  --  2.1  PHOS  --  2.9   GFR: Estimated Creatinine Clearance: 53.5 mL/min (A) (by C-G formula based on SCr of 1.68 mg/dL (H)). Recent Labs  Lab 08/05/23 1235 08/06/23 0620  WBC 3.6* 3.3*    Liver Function Tests: No results for input(s): "AST", "ALT", "ALKPHOS", "BILITOT", "PROT", "ALBUMIN" in the last 168 hours. No results for input(s): "LIPASE", "AMYLASE" in the last 168 hours. No results for input(s): "AMMONIA" in the last 168 hours.  ABG No results found for: "PHART", "PCO2ART", "PO2ART", "HCO3", "TCO2", "ACIDBASEDEF", "O2SAT"   Coagulation Profile: Recent Labs  Lab 08/05/23 1424  INR 1.0    Cardiac Enzymes: No results for input(s): "CKTOTAL", "CKMB", "CKMBINDEX", "TROPONINI" in the last 168 hours.  HbA1C: No results found for: "HGBA1C"  CBG: Recent Labs  Lab 08/06/23 0241  GLUCAP 167*  Please see Amion for details.      DVT/GI PRX  assessed I Assessed the need for Labs I Assessed the need for Foley I Assessed the need for Central Venous Line Family Discussion when available I Assessed the need for Mobilization I made an Assessment of medications to be adjusted accordingly Safety Risk assessment completed  CASE DISCUSSED  IN MULTIDISCIPLINARY ROUNDS WITH ICU TEAM     Critical Care Time devoted to patient care services described in this note is 55 minutes.   Lucie Leather, M.D.  [facility]  Medical Director [facility] Medical Director [facility] Cardio-Pulmonary Department

## 2023-08-06 NOTE — Evaluation (Signed)
Occupational Therapy Evaluation Patient Details Name: Edwin Green MRN: [id number] DOB: 25-Jul-1961 Today's Date: 08/06/2023   History of Present Illness Edwin Green is a 62 y.o. male who presented to the ED with mild confusion/memory impairment.  Vital signs initially stable but upon second check patient in hypertensive urgency with BP 232/156.  This was treated with IV as needed without effect and nicardipine drip was initiated.  CT imaging showed 1.3 cm likely intraparenchymal hemorrhage in the left thalamus with local mass effect. MRI confirmed thalamic bleed with surrounding edema and mild mass effect no underlying mass. PMH includes: polysubstance use, CKD IIIb, & Hypertrophic cardiomyopathy.   Clinical Impression   Pt was seen for OT evaluation this date. Prior to hospital admission, pt was independent with all ADL/IADL management, denies falls history in the last 6 months. Pt presents to acute OT demonstrating impaired ADL performance and functional mobility 2/2 decreased balance, decreased cognition, and decreased functional use of his RUE (See OT problem list for additional functional deficits). Pt currently requires SUPERVISION for safety with functional mobility and ADL management from STS. He requires increased cueing to incorporate his RUE into functional activity t/o session.  Pt would benefit from skilled OT services to address noted impairments and functional limitations (see below for any additional details) in order to maximize safety and independence while minimizing falls risk and caregiver burden. Anticipate the need for follow up OT services upon acute hospital DC.        If plan is discharge home, recommend the following: A little help with walking and/or transfers;A little help with bathing/dressing/bathroom;Help with stairs or ramp for entrance;Assistance with cooking/housework;Assist for transportation    Functional Status Assessment  Patient has had a recent  decline in their functional status and demonstrates the ability to make significant improvements in function in a reasonable and predictable amount of time.  Equipment Recommendations  None recommended by OT    Recommendations for Other Services       Precautions / Restrictions Precautions Precautions: Fall Restrictions Weight Bearing Restrictions: No      Mobility Bed Mobility               General bed mobility comments: deferred pt in recliner at start/end of session    Transfers Overall transfer level: Needs assistance Equipment used: Rolling walker (2 wheels), 1 person hand held assist Transfers: Sit to/from Stand, Bed to chair/wheelchair/BSC Sit to Stand: Supervision, From elevated surface     Step pivot transfers: Supervision            Balance Overall balance assessment: Needs assistance Sitting-balance support: No upper extremity supported, Feet supported Sitting balance-Leahy Scale: Normal Sitting balance - Comments: steady with dynamic sitting balance during functional activity   Standing balance support: During functional activity, Bilateral upper extremity supported, No upper extremity supported Standing balance-Leahy Scale: Good                             ADL either performed or assessed with clinical judgement   ADL Overall ADL's : Needs assistance/impaired Eating/Feeding: Set up;Supervision/ safety;Sitting   Grooming: Cueing for compensatory techniques;Supervision/safety;Set up Grooming Details (indicate cue type and reason): cueing for involvement of RUE into functional tasks. Upper Body Bathing: Supervision/ safety;Set up;Sitting Upper Body Bathing Details (indicate cue type and reason): Anticipated Lower Body Bathing: Supervison/ safety;Set up;Sitting/lateral leans   Upper Body Dressing : Minimal assistance;Cueing for compensatory techniques   Lower Body Dressing: Supervision/safety;Set up;Cueing  for compensatory  techniques Lower Body Dressing Details (indicate cue type and reason): Able to doff/don bilat shoes while sitting in reclienr, but initially does so using LUE to adjust shoes. when cued to use RUE pt has increased difficulty, struggles to incorporate RUE into functional tasks. Toilet Transfer: Supervision/safety;Set up;BSC/3in1;Rolling walker (2 wheels) Toilet Transfer Details (indicate cue type and reason): Simulated from recliner Toileting- Clothing Manipulation and Hygiene: Set up;Supervision/safety;Sit to/from stand;Sitting/lateral lean Toileting - Clothing Manipulation Details (indicate cue type and reason): Anticipated     Functional mobility during ADLs: Set up;Supervision/safety;Rolling walker (2 wheels)       Vision Patient Visual Report: No change from baseline       Perception         Praxis         Pertinent Vitals/Pain Pain Assessment Pain Assessment: No/denies pain     Extremity/Trunk Assessment Upper Extremity Assessment Upper Extremity Assessment: Right hand dominant;RUE deficits/detail (LUE WNL for strength, sensation, coordination) RUE Deficits / Details: Grossly weak, 3/5 t/o. noted decreased involvement in functional activities and decreased FMC as compaired to left with sequential digit opposition. Denies sensory deficit or pain at this time. RUE Sensation: decreased proprioception RUE Coordination: decreased fine motor;decreased gross motor   Lower Extremity Assessment Lower Extremity Assessment: Defer to PT evaluation       Communication Communication Communication: Difficulty communicating thoughts/reduced clarity of speech Cueing Techniques: Verbal cues;Gestural cues   Cognition Arousal: Alert Behavior During Therapy: Flat affect Overall Cognitive Status: Impaired/Different from baseline                                 General Comments: Initially states date as September, but able to self-correct with min cueing. Generally unable  to provide much detail regarding living situation, PLOF, etc. No family at bedside to determine cognitive baseline.     General Comments  Pt displays good R UE strength with mild coordination defecits, unsure of baseline cognition but has some minimal confusion and inability to maintain appropriate/consistent conversation that is, apparently, improved since onset of symptoms    Exercises Other Exercises Other Exercises: Pt educated on role of OT in acute setting, compensatory ADL management strategies for involvement of RUE in setting of weakness/decreased coordination, DC recs, and falls prevention strategies.   Shoulder Instructions      Home Living Family/patient expects to be discharged to:: Private residence Living Arrangements: Non-relatives/Friends Available Help at Discharge:  (States "I have people")                                Lives With: Alone    Prior Functioning/Environment                 ADLs Comments: Pt unable to provide much information re: home set up/PLOF states he was independent with mobility/ADL/IADL management, driving, denies falls history. Otherwise unable to say where he was living or with whom. Also unable to state expected DC dispo. States he is "in between things", and "things got a little out of control". Unclear is this is related to current cognitive difficulties, speech delays, or social situation. Will continue to monitor.        OT Problem List: Decreased strength;Decreased coordination;Decreased activity tolerance;Decreased safety awareness;Impaired balance (sitting and/or standing);Decreased knowledge of use of DME or AE      OT Treatment/Interventions: Self-care/ADL training;Therapeutic exercise;Therapeutic activities;Neuromuscular education;Cognitive  remediation/compensation;DME and/or AE instruction;Patient/family education;Balance training    OT Goals(Current goals can be found in the care plan section) Acute Rehab OT  Goals Patient Stated Goal: to feel better OT Goal Formulation: With patient Time For Goal Achievement: 08/20/23 Potential to Achieve Goals: Good ADL Goals Pt Will Perform Grooming: standing;with modified independence Pt Will Perform Upper Body Dressing: sitting;Independently Pt Will Perform Lower Body Dressing: Independently;sit to/from stand;with adaptive equipment Pt Will Transfer to Toilet: ambulating;regular height toilet;with modified independence Pt Will Perform Toileting - Clothing Manipulation and hygiene: sit to/from stand;with modified independence  OT Frequency: Min 1X/week    Co-evaluation              AM-PAC OT "6 Clicks" Daily Activity     Outcome Measure Help from another person eating meals?: None Help from another person taking care of personal grooming?: None Help from another person toileting, which includes using toliet, bedpan, or urinal?: A Little Help from another person bathing (including washing, rinsing, drying)?: A Little Help from another person to put on and taking off regular upper body clothing?: A Little Help from another person to put on and taking off regular lower body clothing?: A Little 6 Click Score: 20   End of Session Equipment Utilized During Treatment: Rolling walker (2 wheels) Nurse Communication: Mobility status  Activity Tolerance: Patient tolerated treatment well Patient left: in chair;with call bell/phone within reach;with chair alarm set  OT Visit Diagnosis: Other abnormalities of gait and mobility (R26.89);Muscle weakness (generalized) (M62.81)                Time: 9629-5284 OT Time Calculation (min): 21 min Charges:  OT General Charges $OT Visit: 1 Visit OT Evaluation $OT Eval Moderate Complexity: 1 Mod OT Treatments $Self Care/Home Management : 8-22 mins  Rockney Ghee, M.S., OTR/L 08/06/23, 12:09 PM

## 2023-08-06 NOTE — Progress Notes (Signed)
Neurology Progress Note  Patient ID: Edwin Green is a 62 y.o. with PMHx of  has a past medical history of Arthritis, CKD (chronic kidney disease), stage II, Depression, Epistaxis, Hypertension, and Hypertrophic cardiomyopathy (HCC).   Subjective: - Symptoms stable  - Denies headache or other new symptoms  Exam: Vitals:   08/06/23 0800 08/06/23 0900  BP: (!) 149/110 (!) 153/117  Pulse: 73 90  Resp: 12 15  Temp:  98.6 F (37 C)  SpO2: 99% 96%   Gen: In bed, comfortable  Resp: non-labored breathing, no grossly audible wheezing Cardiac: Perfusing extremities well  Abd: soft, nt  Neuro: MS: Awake, alert, following commands, mild aphasia persists and states month as Sept but age as 46. Improved fluency of speech today CN: Right facial droop, otherwise intact  Motor: Mild RUE pronator drift, improved from yesterday  Sensory: Intact to light touch throughout  Pertinent data:   Basic Metabolic Panel: Recent Labs  Lab 08/05/23 1235 08/06/23 0620  NA 135 137  K 3.6 3.6  CL 104 112*  CO2 23 22  GLUCOSE 95 101*  BUN 25* 20  CREATININE 2.01* 1.68*  CALCIUM 8.6* 8.1*  MG  --  2.1  PHOS  --  2.9    CBC: Recent Labs  Lab 08/05/23 1235 08/06/23 0620  WBC 3.6* 3.3*  HGB 13.5 13.6  HCT 40.3 40.2  MCV 90.8 88.9  PLT 228 228    Coagulation Studies: Recent Labs    08/05/23 1424  LABPROT 13.7  INR 1.0      No results found for: "HGBA1C"  Lab Results  Component Value Date   CHOL 145 08/06/2023   HDL 40 (L) 08/06/2023   LDLCALC 95 08/06/2023   TRIG 48 08/06/2023   CHOLHDL 3.6 08/06/2023   MRI brain, MRA head, MRA neck personally reviewed, agree with radiology:   1. 2.2 cm hematoma centered in the left thalamus, with surrounding edema and mild mass effect on the atrium of the left lateral ventricle and third ventricle. No evidence of active extravasation into the hematoma. No hydrocephalus. [Characteristics of hematoma suggestive of subacute blood  products] 2. No intracranial large vessel occlusion or significant stenosis. 3. No hemodynamically significant stenosis in the neck. Pierre.Alas.  Microhemorrhages in the right cerebellum, pons, and right caudate head, consistent with history of poorly controlled hypertension]  Impression: Subacute hypertensive bleed with patient presenting with hypertensive urgency/emergency  Recommendations: -Continue to treat as hypertensive urgency / emergency with gradual normalization of BP (appreciate CCM management of BP meds)  -Okay to start DVT prophylaxis today -No antiplatelets at this time secondary to ICH and recurrent poor medication adherence with risk of recurrent ICH -Q4 neurochecks  -Low-dose atorvastatin 10 mg nightly for goal LDL less than 70 (starting with low-dose due to ICH) -Smoking cessation counseling and medication adherence counseling -A1c goal < 7%, adjust meds as needed to achieve goal -Inpatient neurology will sign off at this time, please do not hesitate to reach out with any questions/concerns. Discussed with nurse at bedside and primary team via secure chat  Brooke Dare MD-PhD [facility] [phone]   CRITICAL CARE Performed by: Gordy Councilman   Total critical care time: 35 minutes  Critical care time was exclusive of separately billable procedures and treating other patients.  Critical care was necessary to treat or prevent imminent or life-threatening deterioration.  Critical care was time spent personally by me on the following activities: development of treatment plan with patient and/or surrogate as well  as nursing, discussions with consultants, evaluation of patient's response to treatment, examination of patient, obtaining history from patient or surrogate, ordering and performing treatments and interventions, ordering and review of laboratory studies, ordering and review of radiographic studies, pulse oximetry and re-evaluation of patient's  condition.

## 2023-08-06 NOTE — Progress Notes (Signed)
PHARMACY CONSULT NOTE  Pharmacy Consult for Electrolyte Monitoring and Replacement   Recent Labs: Potassium (mmol/L)  Date Value  08/06/2023 3.6   Magnesium (mg/dL)  Date Value  82/95/6213 2.1   Calcium (mg/dL)  Date Value  08/65/7846 8.1 (L)   Albumin (g/dL)  Date Value  96/29/5284 4.2  02/04/2018 4.6   Phosphorus (mg/dL)  Date Value  13/24/4010 2.9   Sodium (mmol/L)  Date Value  08/06/2023 137  02/04/2018 141     Assessment: 62 year old male presenting to [facility] ED from PCP office via EMS on 08/05/2023 for evaluation of hypertension in the setting of difficulty with speech and intermittent confusion. Pharmacy is asked to follow and replace electrolytes while in CCU  Goal of Therapy:  Electrolytes WNL  Plan:  ---no electrolyte replacement warranted for today ---recheck electrolytes in am  Lowella Bandy ,PharmD Clinical Pharmacist 08/06/2023 7:13 AM

## 2023-08-06 NOTE — Progress Notes (Signed)
eLink Physician-Brief Progress Note Patient Name: Edwin Green DOB: Mar 19, 1961 MRN: [id number]   Date of Service  08/06/2023  HPI/Events of Note  Patient admitted with severe uncontrolled hypertension and a hypertensive thalamic hemorrhage with mild mass effect resulting in altered mental status.  eICU Interventions  New Patient Evaluation.        Migdalia Dk 08/06/2023, 3:57 AM

## 2023-08-06 NOTE — Evaluation (Signed)
Speech Language Pathology Evaluation Patient Details Name: Edwin Green MRN: [id number] DOB: 08-05-1961 Today's Date: 08/06/2023 Time: 6962-9528 SLP Time Calculation (min) (ACUTE ONLY): 18 min  Problem List:  Patient Active Problem List   Diagnosis Date Noted   Stroke, hemorrhagic (HCC) 08/05/2023   LVH (left ventricular hypertrophy) 02/19/2023   Chronic renal impairment, stage 3b (HCC) 02/18/2023   Epistaxis 02/17/2023   Alcohol use 02/17/2023   Hypertrophic cardiomyopathy (HCC)    Syncope 06/26/2019   Chest pain 06/26/2019   AKI (acute kidney injury) (HCC) 06/26/2019   Malignant hypertension 06/24/2019   Nosebleed 06/24/2019   Hypertensive urgency 06/24/2019   Past Medical History:  Past Medical History:  Diagnosis Date   Arthritis    CKD (chronic kidney disease), stage II    Depression    Epistaxis    Hypertension    Hypertrophic cardiomyopathy (HCC)    a. TTE 7/20 - 60 to 65%, normal LV cavity size, severe concentric LVH with near cavity obliteration in systole, diastolic dysfunction, normal RV systolic function, normal RV cavity size, dilated aortic root measuring 4 cm, no significant valvular abnormality   Past Surgical History:  Past Surgical History:  Procedure Laterality Date   NO PAST SURGERIES     HPI:  Edwin Green is a 62 y.o. male with a history of hypertension, hypertrophic cardiomyopathy, CKD, THC use, quarter pack per day smoking. ETOH use 1-4 beers/daily  and depression who presents to the ED on 08/05/2023 with elevated blood pressure (232/156) and vague symptoms of not feeling well over approximate the last week. The patient states that he went to the doctor today because about a week ago he started to feel unwell although cannot identify any specific symptoms, using terms such as "the situation" to describe what he was feeling.  He states that he has been out of his blood pressure medication for approximate last month although he did not intentionally  discontinue it.  The patient had an episode of right-sided weakness that had improved.  He also has had some memory loss, and has been somewhat intermittently confused. MR  Angio revealed 1. 2.2 cm hematoma centered in the left thalamus, with surrounding  edema and mild mass effect on the atrium of the left lateral ventricle and third ventricle. No evidence of active extravasation into the hematoma. No hydrocephalus. 2. No intracranial large vessel occlusion or significant stenosis. 3. No hemodynamically significant stenosis in the neck.   Assessment / Plan / Recommendation Clinical Impression   Pt presents with mild to moderate cognitive communication impairment that is c/b moderate deficits in memory (short term, working, delayed recall of new information prospective), semi complex problem solving, selective attention, emergent awareness and moderate word finding deficits resulting in vague descriptions that can appear tangential. These deficits are further exacerbated by reduced vocal intensity intermittent imprecise articulation resulting in ~ 75% speech intelligibility at the conversation level. As a result, pt is disoriented to day, month and date. He also has poor recall of medical information and any such memory deficits could led to inappropriate medication managagement at discharge. Suspect that pt will benefit from continued therapy with [facility] and at least intermittent supervision at discharge.     SLP Assessment  SLP Recommendation/Assessment: Patient needs continued Speech Lanaguage Pathology Services SLP Visit Diagnosis: Cognitive communication deficit (R41.841);Dysarthria and anarthria (R47.1)    Recommendations for follow up therapy are one component of a multi-disciplinary discharge planning process, led by the attending physician.  Recommendations may be updated based on  patient status, additional functional criteria and insurance authorization.    Follow Up Recommendations  Home health  SLP    Assistance Recommended at Discharge  Intermittent Supervision/Assistance  Functional Status Assessment Patient has had a recent decline in their functional status and demonstrates the ability to make significant improvements in function in a reasonable and predictable amount of time.  Frequency and Duration min 2x/week  2 weeks      SLP Evaluation Cognition  Overall Cognitive Status: Impaired/Different from baseline Arousal/Alertness: Awake/alert Orientation Level: Oriented to person;Oriented to situation;Disoriented to time Year: 2024 Month: December Day of Week: Incorrect Attention: Selective Selective Attention: Impaired Selective Attention Impairment: Verbal complex;Functional complex Memory: Impaired Memory Impairment: Retrieval deficit;Decreased recall of new information;Decreased short term memory Decreased Short Term Memory: Verbal complex;Functional complex Awareness: Impaired Awareness Impairment: Anticipatory impairment;Emergent impairment Problem Solving: Impaired Problem Solving Impairment: Verbal complex;Functional complex       Comprehension  Auditory Comprehension Overall Auditory Comprehension: Appears within functional limits for tasks assessed Reading Comprehension Reading Status: Not tested    Expression Expression Primary Mode of Expression: Verbal Verbal Expression Overall Verbal Expression: Impaired Initiation: No impairment Automatic Speech: Name;Social Response Level of Generative/Spontaneous Verbalization: Sentence Repetition: No impairment Naming: Impairment Responsive: 76-100% accurate Confrontation: Impaired Convergent: 25-49% accurate Divergent: 25-49% accurate Verbal Errors: Aware of errors;Not aware of errors Pragmatics: No impairment Effective Techniques: Open ended questions;Sentence completion;Semantic cues Non-Verbal Means of Communication: Not applicable Written Expression Dominant Hand: Right Written Expression: Not  tested   Oral / Motor  Oral Motor/Sensory Function Overall Oral Motor/Sensory Function: Within functional limits Motor Speech Overall Motor Speech: Impaired Respiration: Within functional limits Phonation: Low vocal intensity Resonance: Within functional limits Articulation: Impaired Level of Impairment: Sentence Intelligibility: Intelligibility reduced Word: 75-100% accurate Phrase: 75-100% accurate Sentence: 75-100% accurate Conversation: 75-100% accurate Motor Planning: Witnin functional limits Motor Speech Errors: Not applicable Effective Techniques: Increased vocal intensity           Vida Nicol B. Dreama Saa, M.S., CCC-SLP, Tree surgeon Certified Brain Injury Specialist [facility]  [facility] Rehabilitation Services Office [phone] Ascom [phone] Fax [phone]

## 2023-08-07 DIAGNOSIS — I619 Nontraumatic intracerebral hemorrhage, unspecified: Secondary | ICD-10-CM | POA: Diagnosis not present

## 2023-08-07 LAB — MAGNESIUM: Magnesium: 2.1 mg/dL (ref 1.7–2.4)

## 2023-08-07 LAB — CBC
HCT: 38.6 % — ABNORMAL LOW (ref 39.0–52.0)
Hemoglobin: 13 g/dL (ref 13.0–17.0)
MCH: 30.2 pg (ref 26.0–34.0)
MCHC: 33.7 g/dL (ref 30.0–36.0)
MCV: 89.6 fL (ref 80.0–100.0)
Platelets: 198 10*3/uL (ref 150–400)
RBC: 4.31 MIL/uL (ref 4.22–5.81)
RDW: 13.1 % (ref 11.5–15.5)
WBC: 4.1 10*3/uL (ref 4.0–10.5)
nRBC: 0 % (ref 0.0–0.2)

## 2023-08-07 LAB — RENAL FUNCTION PANEL
Albumin: 3.1 g/dL — ABNORMAL LOW (ref 3.5–5.0)
Anion gap: 6 (ref 5–15)
BUN: 22 mg/dL — ABNORMAL HIGH (ref 6–20)
CO2: 22 mmol/L (ref 22–32)
Calcium: 8.2 mg/dL — ABNORMAL LOW (ref 8.9–10.3)
Chloride: 108 mmol/L (ref 98–111)
Creatinine, Ser: 1.86 mg/dL — ABNORMAL HIGH (ref 0.61–1.24)
GFR, Estimated: 41 mL/min — ABNORMAL LOW (ref >60)
Glucose, Bld: 122 mg/dL — ABNORMAL HIGH (ref 70–99)
Phosphorus: 2.8 mg/dL (ref 2.5–4.6)
Potassium: 3.5 mmol/L (ref 3.5–5.1)
Sodium: 136 mmol/L (ref 135–145)

## 2023-08-07 MED ORDER — ORAL CARE MOUTH RINSE: 15.0000 mL | OROMUCOSAL | Status: DC | PRN

## 2023-08-07 NOTE — Plan of Care (Signed)
  Problem: Intracerebral Hemorrhage Tissue Perfusion: Goal: Complications of Intracerebral Hemorrhage will be minimized Outcome: Progressing   Problem: Coping: Goal: Will verbalize positive feelings about self Outcome: Progressing Goal: Will identify appropriate support needs Outcome: Progressing   Problem: Health Behavior/Discharge Planning: Goal: Goals will be collaboratively established with patient/family Outcome: Progressing   Problem: Self-Care: Goal: Ability to participate in self-care as condition permits will improve Outcome: Progressing Goal: Ability to communicate needs accurately will improve Outcome: Progressing   Problem: Intracerebral Hemorrhage Tissue Perfusion: Goal: Complications of Intracerebral Hemorrhage will be minimized Outcome: Progressing

## 2023-08-07 NOTE — TOC Initial Note (Signed)
Transition of Care [facility]) - Initial/Assessment Note    Patient Details  Name: Edwin Green MRN: [id number] Date of Birth: [birth year]  Transition of Care [facility]) CM/SW Contact:    Kreg Shropshire, RN Phone Number: 08/07/2023, 1:56 PM  Clinical Narrative:                 Pt arrived from ED from: home Caregiver Support: Christen Butter DME at [facility]  Transportation: Family Previous Services: none HH/SNF Preference: none First Person of Contact:  PCP: Bethanie Dicker, NP  PT recommended [facility] for pt. Cm reached out to [place] from [facility] and she agreed to take pt.   Cm will continue to follow for toc needs and d/c planning.    Barriers to Discharge: Continued Medical Work up   Patient Goals and CMS Choice   CMS Medicare.gov Compare Post Acute Care list provided to:: Patient Choice offered to / list presented to : Patient      Expected Discharge Plan and Services     Post Acute Care Choice: Home Health Living arrangements for the past 2 months: Single Family Home                             HH Agency: [facility] Date [facility] Agency Contacted: 08/07/23 Time HH Agency Contacted: [id number] Representative spoke with at [facility] Agency: [place] [facility]  Prior Living Arrangements/Services Living arrangements for the past 2 months: Single Family Home Lives with:: Self          Need for Family Participation in Patient Care: Yes (Comment) Care giver support system in place?: Yes (comment)      Activities of Daily Living Home Assistive Devices/Equipment: None ADL Screening (condition at time of admission) Patient's cognitive ability adequate to safely complete daily activities?: Yes Is the patient deaf or have difficulty hearing?: No Does the patient have difficulty seeing, even when wearing glasses/contacts?: No Does the patient have difficulty concentrating, remembering, or making decisions?: No Patient able to express need for assistance with  ADLs?: Yes Does the patient have difficulty dressing or bathing?: No Independently performs ADLs?: Yes (appropriate for developmental age) Does the patient have difficulty walking or climbing stairs?: No Weakness of Legs: None Weakness of Arms/Hands: Right  Permission Sought/Granted                  Emotional Assessment Appearance:: Appears younger than stated age Attitude/Demeanor/Rapport: Engaged Affect (typically observed): Calm Orientation: : Oriented to Self, Oriented to Place, Oriented to  Time, Oriented to Situation      Admission diagnosis:  Intracranial hemorrhage (HCC) [I62.9] Stroke, hemorrhagic (HCC) [I61.9] Hypertensive emergency [I16.1] Patient Active Problem List   Diagnosis Date Noted   Intracranial hemorrhage (HCC) 08/06/2023   Stroke, hemorrhagic (HCC) 08/05/2023   LVH (left ventricular hypertrophy) 02/19/2023   Chronic renal impairment, stage 3b (HCC) 02/18/2023   Epistaxis 02/17/2023   Alcohol use 02/17/2023   Hypertrophic cardiomyopathy (HCC)    Syncope 06/26/2019   Chest pain 06/26/2019   AKI (acute kidney injury) (HCC) 06/26/2019   Malignant hypertension 06/24/2019   Nosebleed 06/24/2019   Hypertensive urgency 06/24/2019   PCP:  Bethanie Dicker, NP Pharmacy:   [facility] [address] [place] [address] Phone: [phone] Fax: [phone]     Social Determinants of Health (SDOH) Social History: SDOH Screenings   Food Insecurity: No Food Insecurity (08/06/2023)  Housing: Low Risk  (  08/06/2023)  Transportation Needs: No Transportation Needs (08/06/2023)  Utilities: Not At Risk (08/06/2023)  Depression (PHQ2-9): High Risk (04/04/2023)  Tobacco Use: High Risk (08/05/2023)   SDOH Interventions:     Readmission Risk Interventions     No data to display

## 2023-08-07 NOTE — Progress Notes (Signed)
Occupational Therapy Treatment Patient Details Name: Edwin Green MRN: [id number] DOB: 03/02/1961 Today's Date: 08/07/2023   History of present illness Edwin Green is a 62 y.o. male who presented to the ED with mild confusion/memory impairment.  Vital signs initially stable but upon second check patient in hypertensive urgency with BP 232/156.  This was treated with IV as needed without effect and nicardipine drip was initiated.  CT imaging showed 1.3 cm likely intraparenchymal hemorrhage in the left thalamus with local mass effect. MRI confirmed thalamic bleed with surrounding edema and mild mass effect no underlying mass. PMH includes: polysubstance use, CKD IIIb, & Hypertrophic cardiomyopathy.   OT comments  Upon entering the room, pt supine in bed and greets therapist. He is motivated and agreeable to OT intervention. Staff report pt has been ambulating at mod I level. Pt performs bed mobility without assistance. He changes B socks for B coordination activity and forced use of R UE. Pt needing to be cued to utilize R UE for task and able to doff dirty ones and don clean ones without physical assistance. Focus on coordination tasks with use of R UE while seated on EOB by having pt write his name and date on paper and then doing written maze activity. Maze of moderate difficulty with 6 errors of pt going outside of maze lines or picking up writing utensil to start again. Pt continues to have expressive difficulties during session but able to have conversation with therapist and let his needs be known throughout session. Pt continues to benefit from OT intervention.       If plan is discharge home, recommend the following:  A little help with bathing/dressing/bathroom;Help with stairs or ramp for entrance;Assistance with cooking/housework;Assist for transportation   Equipment Recommendations  None recommended by OT       Precautions / Restrictions Precautions Precautions: Fall        Mobility Bed Mobility Overal bed mobility: Modified Independent                  Transfers                         Balance Overall balance assessment: Modified Independent                                         ADL either performed or assessed with clinical judgement   ADL Overall ADL's : Needs assistance/impaired                     Lower Body Dressing: Supervision/safety;Set up;Cueing for safety;Sitting/lateral leans                       Vision Patient Visual Report: No change from baseline            Cognition Arousal: Alert Behavior During Therapy: WFL for tasks assessed/performed Overall Cognitive Status: Impaired/Different from baseline                                 General Comments: expressive difficulties                   Pertinent Vitals/ Pain       Pain Assessment Pain Assessment: No/denies pain         Frequency  Min 1X/week        Progress Toward Goals  OT Goals(current goals can now be found in the care plan section)  Progress towards OT goals: Progressing toward goals      AM-PAC OT "6 Clicks" Daily Activity     Outcome Measure   Help from another person eating meals?: None Help from another person taking care of personal grooming?: None Help from another person toileting, which includes using toliet, bedpan, or urinal?: A Little Help from another person bathing (including washing, rinsing, drying)?: A Little Help from another person to put on and taking off regular upper body clothing?: A Little Help from another person to put on and taking off regular lower body clothing?: A Little 6 Click Score: 20    End of Session    OT Visit Diagnosis: Muscle weakness (generalized) (M62.81);Other (comment) (coordination deficits)   Activity Tolerance Patient tolerated treatment well   Patient Left in bed;with call bell/phone within reach;with bed alarm set    Nurse Communication Mobility status        Time: 1536-1600 OT Time Calculation (min): 24 min  Charges: OT General Charges $OT Visit: 1 Visit OT Treatments $Neuromuscular Re-education: 23-37 mins  Jackquline Denmark, MS, OTR/L , CBIS ascom [phone]  08/07/23, 4:14 PM

## 2023-08-07 NOTE — Progress Notes (Signed)
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 Hx of medication noncompliance. Continue w/ neuro checks. PT/OT recs HH. Echo shows EF 60-65%, grade I diastolic dysfunction, no regional wall motion abnormalities, no evidence ot interatrial shunt, no PFO, no ASD. Neuro recs apprec   HTN: poorly controlled. Continue on losartan, amlodipine, hydralazine. 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 Avoid nephrotoxic meds  Polysubstance abuse: daily alcohol use & marijuana use. Pt denies any alcohol use this past week. Librium prn. Continue on thiamine, folic acid & multi-vitamin  Tobacco use: received smoking cessation counseling already.   HLD: continue on statin   Pre-DM: HbA1c 5.7. Needs pre-DM education   DVT prophylaxis: SCDs Code Status: full  Family Communication:  Disposition Plan: likely d/c back home w/ HH   Level of care: Stepdown Status is: Inpatient Remains inpatient appropriate because: severity of illness, poorly controlled HTN & hemorrhagic CVA    Consultants:  Neuro ICU   Procedures:   Antimicrobials:   Subjective: Pt c/o fatigue   Objective: Vitals:   08/07/23 0600 08/07/23 0700 08/07/23 0800 08/07/23 0900  BP: (!) 149/112 (!) 174/117 (!) 185/133   Pulse: (!) 52 66 62 99  Resp: (!) 9 17 15  (!) 25  Temp:   (!) 97.5 F (36.4 C)   TempSrc:   Oral   SpO2: 97% 100% 100% 100%  Weight:      Height:        Intake/Output Summary (Last 24 hours) at 08/07/2023 0922 Last data filed at 08/07/2023 0800 Gross per 24 hour  Intake 600 ml  Output 1400 ml  Net -800 ml   Filed Weights   08/05/23 1233 08/06/23 0240 08/07/23 0434  Weight: 82.1 kg 80.7 kg 81.3 kg     Examination:  General exam: Appears calm and comfortable  Respiratory system: Clear to auscultation. Respiratory effort normal. Cardiovascular system: S1 & S2 +. No , rubs, gallops or clicks. Gastrointestinal system: Abdomen is nondistended, soft and nontender. Normal bowel sounds heard. Central nervous system: Alert and oriented. Moves all extremities  Psychiatry: Judgement and insight appears improved. Mood & affect appropriate.     Data Reviewed: I have personally reviewed following labs and imaging studies  CBC: Recent Labs  Lab 08/05/23 1235 08/06/23 0620 08/07/23 0316  WBC 3.6* 3.3* 4.1  HGB 13.5 13.6 13.0  HCT 40.3 40.2 38.6*  MCV 90.8 88.9 89.6  PLT 228 228 198   Basic Metabolic Panel: Recent Labs  Lab 08/05/23 1235 08/06/23 0620 08/07/23 0316  NA 135 137 136  K 3.6 3.6 3.5  CL 104 112* 108  CO2 23 22 22   GLUCOSE 95 101* 122*  BUN 25* 20 22*  CREATININE 2.01* 1.68* 1.86*  CALCIUM 8.6* 8.1* 8.2*  MG  --  2.1 2.1  PHOS  --  2.9 2.8   GFR: Estimated Creatinine Clearance: 48.3 mL/min (A) (by C-G formula based on SCr of 1.86 mg/dL (H)). Liver Function Tests: Recent Labs  Lab 08/07/23 0316  ALBUMIN 3.1*   No results for input(s): "LIPASE", "AMYLASE" in the last 168 hours. No results for input(s): "AMMONIA" in the last 168 hours. Coagulation Profile: Recent Labs  Lab 08/05/23 1424  INR 1.0  Cardiac Enzymes: No results for input(s): "CKTOTAL", "CKMB", "CKMBINDEX", "TROPONINI" in the last 168 hours. BNP (last 3 results) No results for input(s): "PROBNP" in the last 8760 hours. HbA1C: Recent Labs    08/06/23 0620  HGBA1C 5.7*   CBG: Recent Labs  Lab 08/06/23 0241  GLUCAP 167*   Lipid Profile: Recent Labs    08/06/23 0620  CHOL 145  HDL 40*  LDLCALC 95  TRIG 48  CHOLHDL 3.6   Thyroid Function Tests: No results for input(s): "TSH", "T4TOTAL", "FREET4", "T3FREE", "THYROIDAB" in the last 72 hours. Anemia Panel: Recent Labs     08/06/23 0620  VITAMINB12 785   Sepsis Labs: No results for input(s): "PROCALCITON", "LATICACIDVEN" in the last 168 hours.  Recent Results (from the past 240 hour(s))  MRSA Next Gen by PCR, Nasal     Status: None   Collection Time: 08/06/23  2:46 AM   Specimen: Nasal Mucosa; Nasal Swab  Result Value Ref Range Status   MRSA by PCR Next Gen NOT DETECTED NOT DETECTED Final    Comment: (NOTE) The GeneXpert MRSA Assay (FDA approved for NASAL specimens only), is one component of a comprehensive MRSA colonization surveillance program. It is not intended to diagnose MRSA infection nor to guide or monitor treatment for MRSA infections. Test performance is not FDA approved in patients less than 26 years old. Performed at [facility], [address] [place], [place] [address]          Radiology Studies: ECHOCARDIOGRAM COMPLETE BUBBLE STUDY  Result Date: 08/06/2023    ECHOCARDIOGRAM REPORT   Patient Name:   Edwin Green Date of Exam: 08/06/2023 Medical Rec #:  [id number]       Height:       73.0 in Accession #:    [id number]      Weight:       177.9 lb Date of Birth:  25-Aug-1961       BSA:          2.047 m Patient Age:    61 years        BP:           157/113 mmHg Patient Gender: M               HR:           80 bpm. Exam Location:  [facility] Procedure: 2D Echo, Cardiac Doppler, Color Doppler and Saline Contrast Bubble            Study Indications:     Stroke 434.91 /I63.9  History:         Patient has prior history of Echocardiogram examinations, most                  recent 02/17/2023. Risk Factors:Hypertension. Hypertrophic                  cardiomyopathy, CKD.  Sonographer:     Cristela Blue Referring Phys:  [id number] Gordy Councilman Diagnosing Phys: Julien Nordmann MD  Sonographer Comments: Image acquisition challenging due to patient body habitus. IMPRESSIONS  1. Left ventricular ejection fraction, by estimation, is 60 to 65%. The left ventricle has normal function. The left ventricle has  no regional wall motion abnormalities. There is severe concentric left ventricular hypertrophy. LVOT gradient of 9 mm Hg with valsalva. Left ventricular diastolic parameters are consistent with Grade I diastolic dysfunction (impaired relaxation).  2. Right ventricular systolic function is normal. The right ventricular size is normal. There is  normal pulmonary artery systolic pressure. The estimated right ventricular systolic pressure is 18.8 mmHg.  3. The mitral valve is normal in structure. Mild mitral valve regurgitation. No evidence of mitral stenosis.  4. The aortic valve is tricuspid. Aortic valve regurgitation is mild. No aortic stenosis is present.  5. There is mild dilatation of the aortic root, measuring 41 mm.  6. The inferior vena cava is normal in size with greater than 50% respiratory variability, suggesting right atrial pressure of 3 mmHg.  7. Agitated saline contrast bubble study was negative, with no evidence of any interatrial shunt. FINDINGS  Left Ventricle: Left ventricular ejection fraction, by estimation, is 60 to 65%. The left ventricle has normal function. The left ventricle has no regional wall motion abnormalities. The left ventricular internal cavity size was normal in size. There is  severe concentric left ventricular hypertrophy. Left ventricular diastolic parameters are consistent with Grade I diastolic dysfunction (impaired relaxation). Right Ventricle: The right ventricular size is normal. No increase in right ventricular wall thickness. Right ventricular systolic function is normal. There is normal pulmonary artery systolic pressure. The tricuspid regurgitant velocity is 1.86 m/s, and  with an assumed right atrial pressure of 5 mmHg, the estimated right ventricular systolic pressure is 18.8 mmHg. Left Atrium: Left atrial size was normal in size. Right Atrium: Right atrial size was normal in size. Pericardium: There is no evidence of pericardial effusion. Mitral Valve: The mitral valve  is normal in structure. Mild mitral valve regurgitation. No evidence of mitral valve stenosis. MV peak gradient, 3.7 mmHg. The mean mitral valve gradient is 1.0 mmHg. Tricuspid Valve: The tricuspid valve is normal in structure. Tricuspid valve regurgitation is mild . No evidence of tricuspid stenosis. Aortic Valve: The aortic valve is tricuspid. Aortic valve regurgitation is mild. No aortic stenosis is present. Aortic valve mean gradient measures 3.5 mmHg. Aortic valve peak gradient measures 6.2 mmHg. Aortic valve area, by VTI measures 4.94 cm. Pulmonic Valve: The pulmonic valve was normal in structure. Pulmonic valve regurgitation is not visualized. No evidence of pulmonic stenosis. Aorta: The aortic root is normal in size and structure. There is mild dilatation of the aortic root, measuring 41 mm. Venous: The inferior vena cava is normal in size with greater than 50% respiratory variability, suggesting right atrial pressure of 3 mmHg. IAS/Shunts: No atrial level shunt detected by color flow Doppler. Agitated saline contrast was given intravenously to evaluate for intracardiac shunting. Agitated saline contrast bubble study was negative, with no evidence of any interatrial shunt. There  is no evidence of a patent foramen ovale. There is no evidence of an atrial septal defect.  LEFT VENTRICLE PLAX 2D LVIDd:         3.80 cm   Diastology LVIDs:         2.00 cm   LV e' medial:    5.87 cm/s LV PW:         2.00 cm   LV E/e' medial:  8.0 LV IVS:        2.40 cm   LV e' lateral:   5.87 cm/s LVOT diam:     2.20 cm   LV E/e' lateral: 8.0 LV SV:         81 LV SV Index:   39 LVOT Area:     3.80 cm  RIGHT VENTRICLE RV Basal diam:  2.40 cm RV Mid diam:    2.20 cm RV S prime:     17.50 cm/s TAPSE (M-mode): 1.7 cm LEFT  ATRIUM             Index        RIGHT ATRIUM           Index LA diam:        3.70 cm 1.81 cm/m   RA Area:     11.40 cm LA Vol (A2C):   23.1 ml 11.28 ml/m  RA Volume:   16.50 ml  8.06 ml/m LA Vol (A4C):   22.0  ml 10.75 ml/m LA Biplane Vol: 22.7 ml 11.09 ml/m  AORTIC VALVE AV Area (Vmax):    4.23 cm AV Area (Vmean):   4.22 cm AV Area (VTI):     4.94 cm AV Vmax:           124.00 cm/s AV Vmean:          87.400 cm/s AV VTI:            0.163 m AV Peak Grad:      6.2 mmHg AV Mean Grad:      3.5 mmHg LVOT Vmax:         138.00 cm/s LVOT Vmean:        97.000 cm/s LVOT VTI:          0.212 m LVOT/AV VTI ratio: 1.30  AORTA Ao Root diam: 3.98 cm MITRAL VALVE               TRICUSPID VALVE MV Area (PHT): 4.74 cm    TR Peak grad:   13.8 mmHg MV Area VTI:   3.30 cm    TR Vmax:        186.00 cm/s MV Peak grad:  3.7 mmHg MV Mean grad:  1.0 mmHg    SHUNTS MV Vmax:       0.97 m/s    Systemic VTI:  0.21 m MV Vmean:      53.6 cm/s   Systemic Diam: 2.20 cm MV Decel Time: 160 msec MV E velocity: 47.20 cm/s MV A velocity: 90.70 cm/s MV E/A ratio:  0.52 Julien Nordmann MD Electronically signed by Julien Nordmann MD Signature Date/Time: 08/06/2023/11:49:49 AM    Final    MR Brain W and Wo Contrast  Result Date: 08/05/2023 CLINICAL DATA:  Headache hypertension, hemorrhage on head CT EXAM: MRI HEAD WITHOUT AND WITH CONTRAST MRA HEAD WITHOUT CONTRAST MRA NECK WITHOUT AND WITH CONTRAST TECHNIQUE: Multiplanar, multi-echo pulse sequences of the brain and surrounding structures were acquired without and with intravenous contrast. Angiographic images of the Circle of Willis were acquired using MRA technique without intravenous contrast. Angiographic images of the neck were acquired using MRA technique without and with intravenous contrast. Carotid stenosis measurements (when applicable) are obtained utilizing NASCET criteria, using the distal internal carotid diameter as the denominator. CONTRAST:  8mL GADAVIST GADOBUTROL 1 MMOL/ML IV SOLN COMPARISON:  No prior MRI or MRA available, correlation is made with CT head 08/05/2023 FINDINGS: MRI HEAD FINDINGS Brain: 2.2 x 1.9 x 1.9 cm hematoma centered in the left thalamus (AP x TR x CC) (series 9, image  15 and series 17, image 88), as seen on the same-day head CT. The blood products are peripherally T1 hyperintense and centrally T1 hypointense, and primarily T2 hyperintense, likely subacute blood products. Surrounding T2 hyperintense signal, most likely edema, with mass effect on the atrium of the left lateral ventricle and on the third ventricle. No evidence of active extravasation into the hematoma. Additional foci of hemosiderin deposition are noted in the right cerebellum, pons,  and right caudate head, likely sequela of prior hypertensive microhemorrhages. No abnormal parenchymal or meningeal enhancement. No acute infarct, mass, or midline shift. No hydrocephalus or extra-axial collection. No evidence of intraventricular hemorrhage. Normal craniocervical junction. T2 hyperintense signal in the periventricular white matter, likely the sequela of chronic small vessel ischemic disease. Vascular: Please see MRA findings below. Normal arterial and venous enhancement. Skull and upper cervical spine: Normal marrow signal. Sinuses/Orbits: Clear paranasal sinuses. No acute finding in the orbits. Other: The mastoid air cells are well aerated. MRA HEAD FINDINGS Anterior circulation: Both internal carotid arteries are patent to the termini, without significant stenosis. A1 segments patent. Normal anterior communicating artery. Anterior cerebral arteries are patent to their distal aspects without significant stenosis. No M1 stenosis or occlusion. Distal MCA branches perfused to their distal aspects without significant stenosis. Posterior circulation: Vertebral arteries patent to the vertebrobasilar junction without stenosis. Basilar patent to its distal aspect. Superior cerebellar arteries patent proximally. Patent P1 segments. Near fetal origin of the right PCA from the right posterior communicating artery. The left posterior communicating artery is also patent. PCAs perfused to their distal aspects without significant  stenosis. Anatomic variants: Near fetal origin of the right PCA. MRA NECK FINDINGS Aortic arch: Two-vessel arch with a common origin of the brachiocephalic and left common carotid arteries. Imaged portion shows no evidence of aneurysm or dissection. No significant stenosis of the major arch vessel origins. Right carotid system: No evidence of dissection, occlusion, or hemodynamically significant stenosis (greater than 50%). Left carotid system: No evidence of dissection, occlusion, or hemodynamically significant stenosis (greater than 50%). Vertebral arteries: No evidence of dissection, occlusion, or hemodynamically significant stenosis (greater than 50%). Other: None IMPRESSION: 1. 2.2 cm hematoma centered in the left thalamus, with surrounding edema and mild mass effect on the atrium of the left lateral ventricle and third ventricle. No evidence of active extravasation into the hematoma. No hydrocephalus. 2. No intracranial large vessel occlusion or significant stenosis. 3. No hemodynamically significant stenosis in the neck. Electronically Signed   By: Wiliam Ke M.D.   On: 08/05/2023 20:14   MR ANGIO HEAD WO CONTRAST  Result Date: 08/05/2023 CLINICAL DATA:  Headache hypertension, hemorrhage on head CT EXAM: MRI HEAD WITHOUT AND WITH CONTRAST MRA HEAD WITHOUT CONTRAST MRA NECK WITHOUT AND WITH CONTRAST TECHNIQUE: Multiplanar, multi-echo pulse sequences of the brain and surrounding structures were acquired without and with intravenous contrast. Angiographic images of the Circle of Willis were acquired using MRA technique without intravenous contrast. Angiographic images of the neck were acquired using MRA technique without and with intravenous contrast. Carotid stenosis measurements (when applicable) are obtained utilizing NASCET criteria, using the distal internal carotid diameter as the denominator. CONTRAST:  8mL GADAVIST GADOBUTROL 1 MMOL/ML IV SOLN COMPARISON:  No prior MRI or MRA available,  correlation is made with CT head 08/05/2023 FINDINGS: MRI HEAD FINDINGS Brain: 2.2 x 1.9 x 1.9 cm hematoma centered in the left thalamus (AP x TR x CC) (series 9, image 15 and series 17, image 88), as seen on the same-day head CT. The blood products are peripherally T1 hyperintense and centrally T1 hypointense, and primarily T2 hyperintense, likely subacute blood products. Surrounding T2 hyperintense signal, most likely edema, with mass effect on the atrium of the left lateral ventricle and on the third ventricle. No evidence of active extravasation into the hematoma. Additional foci of hemosiderin deposition are noted in the right cerebellum, pons, and right caudate head, likely sequela of prior hypertensive microhemorrhages. No abnormal  parenchymal or meningeal enhancement. No acute infarct, mass, or midline shift. No hydrocephalus or extra-axial collection. No evidence of intraventricular hemorrhage. Normal craniocervical junction. T2 hyperintense signal in the periventricular white matter, likely the sequela of chronic small vessel ischemic disease. Vascular: Please see MRA findings below. Normal arterial and venous enhancement. Skull and upper cervical spine: Normal marrow signal. Sinuses/Orbits: Clear paranasal sinuses. No acute finding in the orbits. Other: The mastoid air cells are well aerated. MRA HEAD FINDINGS Anterior circulation: Both internal carotid arteries are patent to the termini, without significant stenosis. A1 segments patent. Normal anterior communicating artery. Anterior cerebral arteries are patent to their distal aspects without significant stenosis. No M1 stenosis or occlusion. Distal MCA branches perfused to their distal aspects without significant stenosis. Posterior circulation: Vertebral arteries patent to the vertebrobasilar junction without stenosis. Basilar patent to its distal aspect. Superior cerebellar arteries patent proximally. Patent P1 segments. Near fetal origin of the right  PCA from the right posterior communicating artery. The left posterior communicating artery is also patent. PCAs perfused to their distal aspects without significant stenosis. Anatomic variants: Near fetal origin of the right PCA. MRA NECK FINDINGS Aortic arch: Two-vessel arch with a common origin of the brachiocephalic and left common carotid arteries. Imaged portion shows no evidence of aneurysm or dissection. No significant stenosis of the major arch vessel origins. Right carotid system: No evidence of dissection, occlusion, or hemodynamically significant stenosis (greater than 50%). Left carotid system: No evidence of dissection, occlusion, or hemodynamically significant stenosis (greater than 50%). Vertebral arteries: No evidence of dissection, occlusion, or hemodynamically significant stenosis (greater than 50%). Other: None IMPRESSION: 1. 2.2 cm hematoma centered in the left thalamus, with surrounding edema and mild mass effect on the atrium of the left lateral ventricle and third ventricle. No evidence of active extravasation into the hematoma. No hydrocephalus. 2. No intracranial large vessel occlusion or significant stenosis. 3. No hemodynamically significant stenosis in the neck. Electronically Signed   By: Wiliam Ke M.D.   On: 08/05/2023 20:14   MR ANGIO NECK W WO CONTRAST  Result Date: 08/05/2023 CLINICAL DATA:  Headache hypertension, hemorrhage on head CT EXAM: MRI HEAD WITHOUT AND WITH CONTRAST MRA HEAD WITHOUT CONTRAST MRA NECK WITHOUT AND WITH CONTRAST TECHNIQUE: Multiplanar, multi-echo pulse sequences of the brain and surrounding structures were acquired without and with intravenous contrast. Angiographic images of the Circle of Willis were acquired using MRA technique without intravenous contrast. Angiographic images of the neck were acquired using MRA technique without and with intravenous contrast. Carotid stenosis measurements (when applicable) are obtained utilizing NASCET criteria,  using the distal internal carotid diameter as the denominator. CONTRAST:  8mL GADAVIST GADOBUTROL 1 MMOL/ML IV SOLN COMPARISON:  No prior MRI or MRA available, correlation is made with CT head 08/05/2023 FINDINGS: MRI HEAD FINDINGS Brain: 2.2 x 1.9 x 1.9 cm hematoma centered in the left thalamus (AP x TR x CC) (series 9, image 15 and series 17, image 88), as seen on the same-day head CT. The blood products are peripherally T1 hyperintense and centrally T1 hypointense, and primarily T2 hyperintense, likely subacute blood products. Surrounding T2 hyperintense signal, most likely edema, with mass effect on the atrium of the left lateral ventricle and on the third ventricle. No evidence of active extravasation into the hematoma. Additional foci of hemosiderin deposition are noted in the right cerebellum, pons, and right caudate head, likely sequela of prior hypertensive microhemorrhages. No abnormal parenchymal or meningeal enhancement. No acute infarct, mass, or midline shift.  No hydrocephalus or extra-axial collection. No evidence of intraventricular hemorrhage. Normal craniocervical junction. T2 hyperintense signal in the periventricular white matter, likely the sequela of chronic small vessel ischemic disease. Vascular: Please see MRA findings below. Normal arterial and venous enhancement. Skull and upper cervical spine: Normal marrow signal. Sinuses/Orbits: Clear paranasal sinuses. No acute finding in the orbits. Other: The mastoid air cells are well aerated. MRA HEAD FINDINGS Anterior circulation: Both internal carotid arteries are patent to the termini, without significant stenosis. A1 segments patent. Normal anterior communicating artery. Anterior cerebral arteries are patent to their distal aspects without significant stenosis. No M1 stenosis or occlusion. Distal MCA branches perfused to their distal aspects without significant stenosis. Posterior circulation: Vertebral arteries patent to the vertebrobasilar  junction without stenosis. Basilar patent to its distal aspect. Superior cerebellar arteries patent proximally. Patent P1 segments. Near fetal origin of the right PCA from the right posterior communicating artery. The left posterior communicating artery is also patent. PCAs perfused to their distal aspects without significant stenosis. Anatomic variants: Near fetal origin of the right PCA. MRA NECK FINDINGS Aortic arch: Two-vessel arch with a common origin of the brachiocephalic and left common carotid arteries. Imaged portion shows no evidence of aneurysm or dissection. No significant stenosis of the major arch vessel origins. Right carotid system: No evidence of dissection, occlusion, or hemodynamically significant stenosis (greater than 50%). Left carotid system: No evidence of dissection, occlusion, or hemodynamically significant stenosis (greater than 50%). Vertebral arteries: No evidence of dissection, occlusion, or hemodynamically significant stenosis (greater than 50%). Other: None IMPRESSION: 1. 2.2 cm hematoma centered in the left thalamus, with surrounding edema and mild mass effect on the atrium of the left lateral ventricle and third ventricle. No evidence of active extravasation into the hematoma. No hydrocephalus. 2. No intracranial large vessel occlusion or significant stenosis. 3. No hemodynamically significant stenosis in the neck. Electronically Signed   By: Wiliam Ke M.D.   On: 08/05/2023 20:14   DG Abd 1 View  Result Date: 08/05/2023 CLINICAL DATA:  Altered mental status.  MRI clearance. EXAM: ABDOMEN - 1 VIEW COMPARISON:  Chest radiograph obtained at the same time, including the upper abdomen. FINDINGS: Normal bowel-gas pattern. No metallic foreign bodies. Lumbar spine degenerative changes. IMPRESSION: 1. No metallic foreign bodies. 2. No acute abnormality. Electronically Signed   By: Beckie Salts M.D.   On: 08/05/2023 16:40   DG Chest Port 1 View  Result Date: 08/05/2023 CLINICAL  DATA:  Altered mental status.  MRI clearance. EXAM: PORTABLE CHEST 1 VIEW COMPARISON:  06/26/2019 FINDINGS: Interval poor inspiration and borderline enlargement of the cardiac silhouette. Interval small amount of linear scarring or atelectasis at the left lung base. Otherwise, clear lungs. Unremarkable bones. No metallic foreign bodies. IMPRESSION: 1. No metallic foreign bodies. 2. Interval small amount of linear scarring or atelectasis at the left lung base. 3. Interval poor inspiration and borderline cardiomegaly. Electronically Signed   By: Beckie Salts M.D.   On: 08/05/2023 16:39   CT HEAD WO CONTRAST ( )  Result Date: 08/05/2023 CLINICAL DATA:  Headache, neuro deficit EXAM: CT HEAD WITHOUT CONTRAST TECHNIQUE: Contiguous axial images were obtained from the base of the skull through the vertex without intravenous contrast. RADIATION DOSE REDUCTION: This exam was performed according to the departmental dose-optimization program which includes automated exposure control, adjustment of the mA and/or kV according to patient size and/or use of iterative reconstruction technique. COMPARISON:  None Available. FINDINGS: Brain: Ill defied 1.3 cm area of hyperdensity in  the left thalamus, suspicious for acute intraparenchymal hemorrhage. There is surrounding edema and mild regional mass effect. No evidence of hydrocephalus or extra-axial fluid collection. Vascular: No hyperdense vessel. Skull: No acute fracture. Sinuses/Orbits: Clear sinuses.  No acute orbital findings. IMPRESSION: Ill defied 1.3 cm area of hyperdensity in the left thalamus, suspicious for acute intraparenchymal hemorrhage. Surrounding edema and mild regional mass effect. Recommend MRI head to further characterize and to evaluate for an etiology (such as infarct). Code stroke imaging results were communicated on 08/05/2023 at 1:25 pm to provider PADUCHOWSKI via telephone, who verbally acknowledged these results. Electronically Signed   By: Feliberto Harts M.D.   On: 08/05/2023 13:26        Scheduled Meds:  amLODipine  10 mg Oral Daily   atorvastatin  10 mg Oral QHS   Chlorhexidine Gluconate Cloth  6 each Topical Daily   folic acid  1 mg Oral Daily   hydrALAZINE  50 mg Oral Q8H   losartan  100 mg Oral Daily   multivitamin with minerals  1 tablet Oral Daily   pantoprazole (PROTONIX) IV  40 mg Intravenous QHS   senna-docusate  1 tablet Oral BID   thiamine  100 mg Oral Daily   Or   thiamine  100 mg Intravenous Daily   Continuous Infusions:   LOS: 2 days    Time spent: 35 mins    Charise Killian, MD [facility] Pager [phone]  If 7PM-7AM, please contact night-coverage [email or web address] 08/07/2023, 9:22 AM

## 2023-08-07 NOTE — Plan of Care (Signed)
  Problem: Education: Goal: Knowledge of disease or condition will improve Outcome: Progressing   

## 2023-08-07 NOTE — Progress Notes (Signed)
Physical Therapy Treatment Patient Details Name: Edwin Green MRN: [id number] DOB: 01/01/1961 Today's Date: 08/07/2023   History of Present Illness Edwin Green is a 62 y.o. male who presented to the ED with mild confusion/memory impairment.  Vital signs initially stable but upon second check patient in hypertensive urgency with BP 232/156.  This was treated with IV as needed without effect and nicardipine drip was initiated.  CT imaging showed 1.3 cm likely intraparenchymal hemorrhage in the left thalamus with local mass effect. MRI confirmed thalamic bleed with surrounding edema and mild mass effect no underlying mass. PMH includes: polysubstance use, CKD IIIb, & Hypertrophic cardiomyopathy.    PT Comments  Pt pleasant and motivated, still having some issues with word finding/expressive effort.  He was able to confidently and easily manage clean up after toileting and  management of in-room equipment despite some residual R UE weakness.  Pt ambulated 100s of feet w/o AD, community appropriate speed, no LOBs or safety issues and negotiated up/down flight of steps, some of which w/o UE use showing ability to enter/exit the home.  He remains off from his active and independent baseline, elevated BP remains an issue though he is asymptomatic during activity.  Pt will benefit from continued PT per POC.     If plan is discharge home, recommend the following: Assist for transportation   Can travel by private vehicle        Equipment Recommendations  None recommended by PT    Recommendations for Other Services       Precautions / Restrictions Precautions Precautions: Fall Restrictions Weight Bearing Restrictions: No     Mobility  Bed Mobility Overal bed mobility: Modified Independent                  Transfers Overall transfer level: Modified independent Equipment used: None Transfers: Sit to/from Stand, Bed to chair/wheelchair/BSC             General transfer  comment: Pt able to rise to standing with only supervision, not hesitancy or overt unsteadiness with transition    Ambulation/Gait Ambulation/Gait assistance: Modified independent (Device/Increase time) Gait Distance (Feet): 600 Feet Assistive device: Rolling walker (2 wheels), None         General Gait Details: Pt was able to ambulate with safe and confident effort, no need for UEs/AD. HR remains stable (BP remains high within permissble HTN allowances).  Pt reports still not quite to baseline, but ultimately moving well and safely.   Stairs Stairs: Yes Stairs assistance: Supervision Stair Management: No rails, One rail Right Number of Stairs: 15 General stair comments: Pt was able to negotiate up/down a flight of steps w/o issue.  Self selects light rail use on first few steps, with cuing to simulate home entry and able to do both up/down steps with reciprocal strategy w/o LOBs w/o UEs (slower than with UEs, but safe never-the-less)   Wheelchair Mobility     Tilt Bed    Modified Rankin (Stroke Patients Only)       Balance Overall balance assessment: Modified Independent Sitting-balance support: No upper extremity supported, Feet supported Sitting balance-Leahy Scale: Normal     Standing balance support: During functional activity, No upper extremity supported Standing balance-Leahy Scale: Normal Standing balance comment: able to grab objects off the floor, weight shift pushing doors/heavier objects, negotiate steps reciprocally w/o UEs  Cognition Arousal: Alert Behavior During Therapy: Flat affect Overall Cognitive Status: Impaired/Different from baseline                                 General Comments: still struggling to fully articulate fully thoughts/sentences        Exercises      General Comments General comments (skin integrity, edema, etc.): Pt moving with great confidence, mild cognitive/expressive  issues.  Pt symptomatic during mobility but still with significantly elevated BP (162/117 end of session)      Pertinent Vitals/Pain Pain Assessment Pain Assessment: No/denies pain    Home Living                          Prior Function            PT Goals (current goals can now be found in the care plan section) Progress towards PT goals: Progressing toward goals    Frequency    Min 1X/week      PT Plan      Co-evaluation              AM-PAC PT "6 Clicks" Mobility   Outcome Measure  Help needed turning from your back to your side while in a flat bed without using bedrails?: None Help needed moving from lying on your back to sitting on the side of a flat bed without using bedrails?: None Help needed moving to and from a bed to a chair (including a wheelchair)?: None Help needed standing up from a chair using your arms (e.g., wheelchair or bedside chair)?: None Help needed to walk in hospital room?: None Help needed climbing 3-5 steps with a railing? : None 6 Click Score: 24    End of Session Equipment Utilized During Treatment: Gait belt Activity Tolerance: Patient tolerated treatment well;Patient limited by fatigue Patient left: with call bell/phone within reach;in chair Nurse Communication: Mobility status (BP) PT Visit Diagnosis: Other symptoms and signs involving the nervous system (R29.898);Difficulty in walking, not elsewhere classified (R26.2)     Time: 0960-4540 PT Time Calculation (min) (ACUTE ONLY): 14 min  Charges:    $Therapeutic Exercise: 8-22 mins PT General Charges $$ ACUTE PT VISIT: 1 Visit                     Malachi Pro, DPT 08/07/2023, 2:34 PM

## 2023-08-08 DIAGNOSIS — I619 Nontraumatic intracerebral hemorrhage, unspecified: Secondary | ICD-10-CM | POA: Diagnosis not present

## 2023-08-08 LAB — BASIC METABOLIC PANEL
Anion gap: 5 (ref 5–15)
BUN: 21 mg/dL — ABNORMAL HIGH (ref 6–20)
CO2: 25 mmol/L (ref 22–32)
Calcium: 8.4 mg/dL — ABNORMAL LOW (ref 8.9–10.3)
Chloride: 107 mmol/L (ref 98–111)
Creatinine, Ser: 1.82 mg/dL — ABNORMAL HIGH (ref 0.61–1.24)
GFR, Estimated: 42 mL/min — ABNORMAL LOW (ref >60)
Glucose, Bld: 117 mg/dL — ABNORMAL HIGH (ref 70–99)
Potassium: 3.9 mmol/L (ref 3.5–5.1)
Sodium: 137 mmol/L (ref 135–145)

## 2023-08-08 MED ORDER — CARVEDILOL 25 MG PO TABS
25.0000 mg | ORAL_TABLET | Freq: Two times a day (BID) | ORAL | Status: DC
  Administered 2023-08-08 – 2023-08-09 (×2): 25 mg via ORAL
  Filled 2023-08-08: qty 1
  Filled 2023-08-08: qty 2

## 2023-08-08 MED ORDER — HYDRALAZINE HCL 50 MG PO TABS
75.0000 mg | ORAL_TABLET | Freq: Three times a day (TID) | ORAL | Status: DC
  Administered 2023-08-08 – 2023-08-09 (×4): 75 mg via ORAL
  Filled 2023-08-08 (×4): qty 2

## 2023-08-08 NOTE — Progress Notes (Signed)
Occupational Therapy Treatment Patient Details Name: Edwin Green MRN: [id number] DOB: 1961/07/06 Today's Date: 08/08/2023   History of present illness Edwin Green is a 62 y.o. male who presented to the ED with mild confusion/memory impairment.  Vital signs initially stable but upon second check patient in hypertensive urgency with BP 232/156.  This was treated with IV as needed without effect and nicardipine drip was initiated.  CT imaging showed 1.3 cm likely intraparenchymal hemorrhage in the left thalamus with local mass effect. MRI confirmed thalamic bleed with surrounding edema and mild mass effect no underlying mass. PMH includes: polysubstance use, CKD IIIb, & Hypertrophic cardiomyopathy.   OT comments  Chart reviewed to date, pt greeted in bed, agreeable to OT tx session. Pt continues to have expressive deficits and poor multi step direction following noted on this date. Improvements noted in incorporation of RUE into functional, bimanual tasks with crossing midline with RUE to facilitate improved RUE function also targeted during session. Please see further details below. Pt is making progress towards goals, OT will continue to follow acutely.       If plan is discharge home, recommend the following:  A little help with bathing/dressing/bathroom;Help with stairs or ramp for entrance;Assistance with cooking/housework;Assist for transportation   Equipment Recommendations  None recommended by OT    Recommendations for Other Services      Precautions / Restrictions Precautions Precautions: Fall Restrictions Weight Bearing Restrictions: No       Mobility Bed Mobility Overal bed mobility: Modified Independent                  Transfers Overall transfer level: Modified independent Equipment used: None                     Balance Overall balance assessment: Modified Independent Sitting-balance support: No upper extremity supported, Feet supported Sitting  balance-Leahy Scale: Normal     Standing balance support: During functional activity, No upper extremity supported Standing balance-Leahy Scale: Good                             ADL either performed or assessed with clinical judgement   ADL Overall ADL's : Needs assistance/impaired     Grooming: Cueing for compensatory techniques;Supervision/safety;Set up;Standing;Wash/dry hands;Wash/dry face               Lower Body Dressing: Supervision/safety;Sitting/lateral leans Lower Body Dressing Details (indicate cue type and reason): donn/doff socks Toilet Transfer: Supervision/safety;Ambulation           Functional mobility during ADLs: Modified independent        Cognition Arousal: Alert Behavior During Therapy: WFL for tasks assessed/performed Overall Cognitive Status: Impaired/Different from baseline                                 General Comments: expressive deficits, good one step direction following, poor two step direction following completing 0/3 attempts at two step directions after initial cues        Exercises Other Exercises Other Exercises: RUE dynamic sitting, then graded up to standing reaching tasks simulating IADL set up with RUE across midline across planes of active reach 10x 1 set in sitting with MOD I, 10x 4 sets in standing with supervision, verbal cues for body mechanics for optimal crossing midline technique for safety       General Comments BP 172/111 prior to  start of session, 178/118 at end of session, nurse aware and BP within parameters    Pertinent Vitals/ Pain       Pain Assessment Pain Assessment: No/denies pain   Frequency  Min 1X/week        Progress Toward Goals  OT Goals(current goals can now be found in the care plan section)  Progress towards OT goals: Progressing toward goals      AM-PAC OT "6 Clicks" Daily Activity     Outcome Measure   Help from another person eating meals?: None Help  from another person taking care of personal grooming?: None Help from another person toileting, which includes using toliet, bedpan, or urinal?: A Little Help from another person bathing (including washing, rinsing, drying)?: A Little Help from another person to put on and taking off regular upper body clothing?: None Help from another person to put on and taking off regular lower body clothing?: None 6 Click Score: 22    End of Session    OT Visit Diagnosis: Muscle weakness (generalized) (M62.81);Other (comment) (coordination deficits)   Activity Tolerance Patient tolerated treatment well   Patient Left in chair;with call bell/phone within reach;Other (comment) (provided word search for patient)   Nurse Communication Mobility status        Time: 8295-6213 OT Time Calculation (min): 22 min  Charges: OT General Charges $OT Visit: 1 Visit OT Treatments $Therapeutic Activity: 8-22 mins Oleta Mouse, OTD OTR/L  08/08/23, 3:42 PM

## 2023-08-08 NOTE — Progress Notes (Signed)
Speech Language Pathology Treatment: Cognitive-Linquistic  Patient Details Name: Edwin Green MRN: [id number] DOB: 1961/11/24 Today's Date: 08/08/2023 Time: 1207-1220 SLP Time Calculation (min) (ACUTE ONLY): 13 min  Assessment / Plan / Recommendation Clinical Impression  Skilled treatment session targeted pt's word finding deficits. SLP facilitated session by providing moderate to minimal assistance during generative naming task. SLP provided pt with basic categories to build vocabulary. With moderate assistance faded to minimal assistance to visualize objects/items, pt able to constantly name 6-7 items in each category x 5. Pt is able to communicate his basic wants/needs but would continue ST services either thru [facility] or Outpatient to target his moderate higher level word finding and cognitive communication deficits.    HPI HPI: Edwin Green is a 62 y.o. male with a history of hypertension, hypertrophic cardiomyopathy, CKD, THC use, quarter pack per day smoking. ETOH use 1-4 beers/daily  and depression who presents to the ED on 08/05/2023 with elevated blood pressure (232/156) and vague symptoms of not feeling well over approximate the last week. The patient states that he went to the doctor today because about a week ago he started to feel unwell although cannot identify any specific symptoms, using terms such as "the situation" to describe what he was feeling.  He states that he has been out of his blood pressure medication for approximate last month although he did not intentionally discontinue it.  The patient had an episode of right-sided weakness that had improved.  He also has had some memory loss, and has been somewhat intermittently confused. MR  Angio revealed 1. 2.2 cm hematoma centered in the left thalamus, with surrounding  edema and mild mass effect on the atrium of the left lateral ventricle and third ventricle. No evidence of active extravasation into the hematoma. No hydrocephalus. 2.  No intracranial large vessel occlusion or significant stenosis. 3. No hemodynamically significant stenosis in the neck.      SLP Plan  All goals met      Recommendations for follow up therapy are one component of a multi-disciplinary discharge planning process, led by the attending physician.  Recommendations may be updated based on patient status, additional functional criteria and insurance authorization.    Recommendations   HHST vs Outpatient ST                       Intermittent Supervision/Assistance Cognitive communication deficit (R41.841);Dysarthria and anarthria (R47.1)     All goals met    [name] Dreama Saa, M.S., CCC-SLP, Tree surgeon Certified Brain Injury Specialist [facility]  [facility] Rehabilitation Services Office [phone] Ascom [phone] Fax [phone]

## 2023-08-08 NOTE — Plan of Care (Signed)
  Problem: Coping: Goal: Will verbalize positive feelings about self Outcome: Progressing Goal: Will identify appropriate support needs Outcome: Progressing   Problem: Health Behavior/Discharge Planning: Goal: Ability to manage health-related needs will improve Outcome: Progressing   Problem: Self-Care: Goal: Ability to participate in self-care as condition permits will improve Outcome: Progressing Goal: Ability to communicate needs accurately will improve Outcome: Progressing

## 2023-08-08 NOTE — Progress Notes (Signed)
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 Hx of medication noncompliance. Continue w/ neuro checks. PT/OT recs HH. Echo shows EF 60-65%, grade I diastolic dysfunction, no regional wall motion abnormalities, no evidence ot interatrial shunt, no PFO, no ASD. Neuro recs apprec   HTN: poorly controlled. Continue on losartan, amlodipine. Increase hydralazine to 75mg  TID and restart home coreg. 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   Polysubstance abuse: daily alcohol use & marijuana use. Pt denies any alcohol use this past week. Librium prn. Continue on multi-vitamin, thiamine, folic acie   Tobacco use: received smoking cessation counseling already.   HLD: continue on statin   Pre-DM: HbA1c 5.7. Needs preDM education    DVT prophylaxis: SCDs Code Status: full  Family Communication:  Disposition Plan: likely d/c back home w/ HH   Level of care: Stepdown Status is: Inpatient Remains inpatient appropriate because: severity of illness, poorly controlled HTN & hemorrhagic CVA    Consultants:  Neuro ICU   Procedures:   Antimicrobials:   Subjective: Pt c/o malaise  Objective: Vitals:   08/08/23 0539 08/08/23 0600 08/08/23 0700 08/08/23 0800  BP: (!) 162/104 (!) 171/126 (!) 177/109 (!) 174/113  Pulse:  64 73 83  Resp:  15 (!) 22 19  Temp:    98.4 F (36.9 C)  TempSrc:      SpO2:  100% 100% 100%  Weight:      Height:        Intake/Output Summary (Last 24 hours) at 08/08/2023 0908 Last data filed at 08/08/2023 0757 Gross per 24 hour  Intake 1440 ml  Output 1250 ml  Net 190 ml   Filed Weights   08/06/23 0240 08/07/23 0434 08/08/23 0500  Weight: 80.7 kg 81.3  kg 81.3 kg    Examination:  General exam: Appears comfortable  Respiratory system: clear breath sounds b/l Cardiovascular system: S1/S2+. No rubs or clicks  Gastrointestinal system: Abd is soft, NT, ND & normal bowel sounds  Central nervous system: alert & oriented. Moves all extremities  Psychiatry: Judgement and insight appears at baseline. Flat mood and affect      Data Reviewed: I have personally reviewed following labs and imaging studies  CBC: Recent Labs  Lab 08/05/23 1235 08/06/23 0620 08/07/23 0316  WBC 3.6* 3.3* 4.1  HGB 13.5 13.6 13.0  HCT 40.3 40.2 38.6*  MCV 90.8 88.9 89.6  PLT 228 228 198   Basic Metabolic Panel: Recent Labs  Lab 08/05/23 1235 08/06/23 0620 08/07/23 0316 08/08/23 0242  NA 135 137 136 137  K 3.6 3.6 3.5 3.9  CL 104 112* 108 107  CO2 23 22 22 25   GLUCOSE 95 101* 122* 117*  BUN 25* 20 22* 21*  CREATININE 2.01* 1.68* 1.86* 1.82*  CALCIUM 8.6* 8.1* 8.2* 8.4*  MG  --  2.1 2.1  --   PHOS  --  2.9 2.8  --    GFR: Estimated Creatinine Clearance: 49.4 mL/min (A) (by C-G formula based on SCr of 1.82 mg/dL (H)). Liver Function Tests: Recent Labs  Lab 08/07/23 0316  ALBUMIN 3.1*   No results for input(s): "LIPASE", "AMYLASE" in the last 168 hours. No results for input(s): "AMMONIA" in the  last 168 hours. Coagulation Profile: Recent Labs  Lab 08/05/23 1424  INR 1.0   Cardiac Enzymes: No results for input(s): "CKTOTAL", "CKMB", "CKMBINDEX", "TROPONINI" in the last 168 hours. BNP (last 3 results) No results for input(s): "PROBNP" in the last 8760 hours. HbA1C: Recent Labs    08/06/23 0620  HGBA1C 5.7*   CBG: Recent Labs  Lab 08/06/23 0241  GLUCAP 167*   Lipid Profile: Recent Labs    08/06/23 0620  CHOL 145  HDL 40*  LDLCALC 95  TRIG 48  CHOLHDL 3.6   Thyroid Function Tests: No results for input(s): "TSH", "T4TOTAL", "FREET4", "T3FREE", "THYROIDAB" in the last 72 hours. Anemia Panel: Recent Labs     08/06/23 0620  VITAMINB12 785   Sepsis Labs: No results for input(s): "PROCALCITON", "LATICACIDVEN" in the last 168 hours.  Recent Results (from the past 240 hour(s))  MRSA Next Gen by PCR, Nasal     Status: None   Collection Time: 08/06/23  2:46 AM   Specimen: Nasal Mucosa; Nasal Swab  Result Value Ref Range Status   MRSA by PCR Next Gen NOT DETECTED NOT DETECTED Final    Comment: (NOTE) The GeneXpert MRSA Assay (FDA approved for NASAL specimens only), is one component of a comprehensive MRSA colonization surveillance program. It is not intended to diagnose MRSA infection nor to guide or monitor treatment for MRSA infections. Test performance is not FDA approved in patients less than 47 years old. Performed at [facility], [address] [place], [place] [address]          Radiology Studies: ECHOCARDIOGRAM COMPLETE BUBBLE STUDY  Result Date: 08/06/2023    ECHOCARDIOGRAM REPORT   Patient Name:   [name] Date of Exam: 08/06/2023 Medical Rec #:  [id number]       Height:       73.0 in Accession #:    [id number]      Weight:       177.9 lb Date of Birth:  1961/06/05       BSA:          2.047 m Patient Age:    62 years        BP:           157/113 mmHg Patient Gender: M               HR:           80 bpm. Exam Location:  [facility] Procedure: 2D Echo, Cardiac Doppler, Color Doppler and Saline Contrast Bubble            Study Indications:     Stroke 434.91 /I63.9  History:         Patient has prior history of Echocardiogram examinations, most                  recent 02/17/2023. Risk Factors:Hypertension. Hypertrophic                  cardiomyopathy, CKD.  Sonographer:     Cristela Blue Referring Phys:  [id number] Gordy Councilman Diagnosing Phys: Julien Nordmann MD  Sonographer Comments: Image acquisition challenging due to patient body habitus. IMPRESSIONS  1. Left ventricular ejection fraction, by estimation, is 60 to 65%. The left ventricle has normal function. The left ventricle has  no regional wall motion abnormalities. There is severe concentric left ventricular hypertrophy. LVOT gradient of 9 mm Hg with valsalva. Left ventricular diastolic parameters are consistent with Grade I diastolic dysfunction (impaired relaxation).  2. Right ventricular systolic function is normal. The right ventricular size is normal. There is normal pulmonary artery systolic pressure. The estimated right ventricular systolic pressure is 18.8 mmHg.  3. The mitral valve is normal in structure. Mild mitral valve regurgitation. No evidence of mitral stenosis.  4. The aortic valve is tricuspid. Aortic valve regurgitation is mild. No aortic stenosis is present.  5. There is mild dilatation of the aortic root, measuring 41 mm.  6. The inferior vena cava is normal in size with greater than 50% respiratory variability, suggesting right atrial pressure of 3 mmHg.  7. Agitated saline contrast bubble study was negative, with no evidence of any interatrial shunt. FINDINGS  Left Ventricle: Left ventricular ejection fraction, by estimation, is 60 to 65%. The left ventricle has normal function. The left ventricle has no regional wall motion abnormalities. The left ventricular internal cavity size was normal in size. There is  severe concentric left ventricular hypertrophy. Left ventricular diastolic parameters are consistent with Grade I diastolic dysfunction (impaired relaxation). Right Ventricle: The right ventricular size is normal. No increase in right ventricular wall thickness. Right ventricular systolic function is normal. There is normal pulmonary artery systolic pressure. The tricuspid regurgitant velocity is 1.86 m/s, and  with an assumed right atrial pressure of 5 mmHg, the estimated right ventricular systolic pressure is 18.8 mmHg. Left Atrium: Left atrial size was normal in size. Right Atrium: Right atrial size was normal in size. Pericardium: There is no evidence of pericardial effusion. Mitral Valve: The mitral valve  is normal in structure. Mild mitral valve regurgitation. No evidence of mitral valve stenosis. MV peak gradient, 3.7 mmHg. The mean mitral valve gradient is 1.0 mmHg. Tricuspid Valve: The tricuspid valve is normal in structure. Tricuspid valve regurgitation is mild . No evidence of tricuspid stenosis. Aortic Valve: The aortic valve is tricuspid. Aortic valve regurgitation is mild. No aortic stenosis is present. Aortic valve mean gradient measures 3.5 mmHg. Aortic valve peak gradient measures 6.2 mmHg. Aortic valve area, by VTI measures 4.94 cm. Pulmonic Valve: The pulmonic valve was normal in structure. Pulmonic valve regurgitation is not visualized. No evidence of pulmonic stenosis. Aorta: The aortic root is normal in size and structure. There is mild dilatation of the aortic root, measuring 41 mm. Venous: The inferior vena cava is normal in size with greater than 50% respiratory variability, suggesting right atrial pressure of 3 mmHg. IAS/Shunts: No atrial level shunt detected by color flow Doppler. Agitated saline contrast was given intravenously to evaluate for intracardiac shunting. Agitated saline contrast bubble study was negative, with no evidence of any interatrial shunt. There  is no evidence of a patent foramen ovale. There is no evidence of an atrial septal defect.  LEFT VENTRICLE PLAX 2D LVIDd:         3.80 cm   Diastology LVIDs:         2.00 cm   LV e' medial:    5.87 cm/s LV PW:         2.00 cm   LV E/e' medial:  8.0 LV IVS:        2.40 cm   LV e' lateral:   5.87 cm/s LVOT diam:     2.20 cm   LV E/e' lateral: 8.0 LV SV:         81 LV SV Index:   39 LVOT Area:     3.80 cm  RIGHT VENTRICLE RV Basal diam:  2.40 cm RV Mid diam:    2.20  cm RV S prime:     17.50 cm/s TAPSE (M-mode): 1.7 cm LEFT ATRIUM             Index        RIGHT ATRIUM           Index LA diam:        3.70 cm 1.81 cm/m   RA Area:     11.40 cm LA Vol (A2C):   23.1 ml 11.28 ml/m  RA Volume:   16.50 ml  8.06 ml/m LA Vol (A4C):   22.0  ml 10.75 ml/m LA Biplane Vol: 22.7 ml 11.09 ml/m  AORTIC VALVE AV Area (Vmax):    4.23 cm AV Area (Vmean):   4.22 cm AV Area (VTI):     4.94 cm AV Vmax:           124.00 cm/s AV Vmean:          87.400 cm/s AV VTI:            0.163 m AV Peak Grad:      6.2 mmHg AV Mean Grad:      3.5 mmHg LVOT Vmax:         138.00 cm/s LVOT Vmean:        97.000 cm/s LVOT VTI:          0.212 m LVOT/AV VTI ratio: 1.30  AORTA Ao Root diam: 3.98 cm MITRAL VALVE               TRICUSPID VALVE MV Area (PHT): 4.74 cm    TR Peak grad:   13.8 mmHg MV Area VTI:   3.30 cm    TR Vmax:        186.00 cm/s MV Peak grad:  3.7 mmHg MV Mean grad:  1.0 mmHg    SHUNTS MV Vmax:       0.97 m/s    Systemic VTI:  0.21 m MV Vmean:      53.6 cm/s   Systemic Diam: 2.20 cm MV Decel Time: 160 msec MV E velocity: 47.20 cm/s MV A velocity: 90.70 cm/s MV E/A ratio:  0.52 Julien Nordmann MD Electronically signed by Julien Nordmann MD Signature Date/Time: 08/06/2023/11:49:49 AM    Final         Scheduled Meds:  amLODipine  10 mg Oral Daily   atorvastatin  10 mg Oral QHS   Chlorhexidine Gluconate Cloth  6 each Topical Daily   folic acid  1 mg Oral Daily   hydrALAZINE  75 mg Oral Q8H   losartan  100 mg Oral Daily   multivitamin with minerals  1 tablet Oral Daily   pantoprazole (PROTONIX) IV  40 mg Intravenous QHS   senna-docusate  1 tablet Oral BID   thiamine  100 mg Oral Daily   Or   thiamine  100 mg Intravenous Daily   Continuous Infusions:   LOS: 3 days    Time spent: 25 mins    Charise Killian, MD [facility] Pager [phone]  If 7PM-7AM, please contact night-coverage [email or web address] 08/08/2023, 9:08 AM

## 2023-08-08 NOTE — Progress Notes (Signed)
Physical Therapy Treatment Patient Details Name: Lennon Kucharski MRN: [id number] DOB: 15-May-1961 Today's Date: 08/08/2023   History of Present Illness Erling Sherrard is a 62 y.o. male who presented to the ED with mild confusion/memory impairment.  Vital signs initially stable but upon second check patient in hypertensive urgency with BP 232/156.  This was treated with IV as needed without effect and nicardipine drip was initiated.  CT imaging showed 1.3 cm likely intraparenchymal hemorrhage in the left thalamus with local mass effect. MRI confirmed thalamic bleed with surrounding edema and mild mass effect no underlying mass. PMH includes: polysubstance use, CKD IIIb, & Hypertrophic cardiomyopathy.    PT Comments  Pt was pleasant and motivated to participate during the session and put forth good effort throughout. Of note pt with most recent BP of 172/111: spoke with nursing, pt was cleared to work with therapy without restriction; Pt's BP values remained within permissive hypertension guidelines throughout session. Pt able to amb around unit with RW at [facility] showing no imbalance throughout. Pt provided cues to keep self centered when using RW to ensure safe DME use and amb. Pt able to perform Various static and dynamic standing balance activities with BUE unsupported. Pt tolerating all balance activities performed today, only showing minor swaying with eyes closed activity. Pt will benefit from continued PT services upon discharge to safely address deficits listed in patient problem list for decreased caregiver assistance and eventual return to PLOF.    If plan is discharge home, recommend the following: Assist for transportation   Can travel by private vehicle        Equipment Recommendations  None recommended by PT    Recommendations for Other Services       Precautions / Restrictions Precautions Precautions: Fall Restrictions Weight Bearing Restrictions: No     Mobility  Bed  Mobility               General bed mobility comments: pt in recliner at start of session    Transfers Overall transfer level: Supervision Equipment used: Rolling walker (2 wheels) Transfers: Sit to/from Stand Sit to Stand: Supervision           General transfer comment: Pt able to rise with RW at supervision level, no imbalance seen    Ambulation/Gait Ambulation/Gait assistance: Contact guard assist Gait Distance (Feet): 200 Feet Assistive device: Rolling walker (2 wheels) Gait Pattern/deviations: Step-through pattern, Decreased stride length, Decreased step length - right, Decreased step length - left Gait velocity: decreased     General Gait Details: Pt able to amb with RW lap around unit, no overt imbalance seen, PT given cues for pt to keep self aligned within RW as pt had tendency to stay closer to R side of walker. pt also instructed to keep walker completely on ground when performing sharp turns during amb   Stairs             Wheelchair Mobility     Tilt Bed    Modified Rankin (Stroke Patients Only)       Balance Overall balance assessment: Modified Independent Sitting-balance support: No upper extremity supported, Feet supported Sitting balance-Leahy Scale: Normal     Standing balance support: During functional activity, No upper extremity supported Standing balance-Leahy Scale: Good Standing balance comment: Pt able to perform various balance tasks                            Cognition Arousal: Alert Behavior  During Therapy: WFL for tasks assessed/performed Overall Cognitive Status: Impaired/Different from baseline                                          Exercises Other Exercises Other Exercises: Pt peformed standing dynamic balance activities: reaching and cross body reaching at raised try with semi tandem and rhomberg positioning. Other Exercises: Pt standing with narrow BoS eyes closed/eyes open x 3  rounds of 15 seconds Other Exercises: Pt performing horizontal and vertical head turns 1 x 30 seconds each with narrow BoS    General Comments       Pertinent Vitals/Pain Pain Assessment Pain Assessment: No/denies pain    Home Living                          Prior Function            PT Goals (current goals can now be found in the care plan section) Progress towards PT goals: Progressing toward goals    Frequency    Min 1X/week      PT Plan      Co-evaluation              AM-PAC PT "6 Clicks" Mobility   Outcome Measure  Help needed turning from your back to your side while in a flat bed without using bedrails?: None Help needed moving from lying on your back to sitting on the side of a flat bed without using bedrails?: None Help needed moving to and from a bed to a chair (including a wheelchair)?: A Little Help needed standing up from a chair using your arms (e.g., wheelchair or bedside chair)?: A Little Help needed to walk in hospital room?: A Little Help needed climbing 3-5 steps with a railing? : A Little 6 Click Score: 20    End of Session Equipment Utilized During Treatment: Gait belt Activity Tolerance: Patient tolerated treatment well Patient left: with call bell/phone within reach;in chair Nurse Communication: Mobility status (BP) PT Visit Diagnosis: Other symptoms and signs involving the nervous system (R29.898);Difficulty in walking, not elsewhere classified (R26.2)     Time: 8657-8469 PT Time Calculation (min) (ACUTE ONLY): 18 min  Charges:                            Cecile Sheerer, SPT 08/08/23, 5:20 PM

## 2023-08-09 DIAGNOSIS — I619 Nontraumatic intracerebral hemorrhage, unspecified: Secondary | ICD-10-CM | POA: Diagnosis not present

## 2023-08-09 LAB — BASIC METABOLIC PANEL
Anion gap: 6 (ref 5–15)
BUN: 23 mg/dL — ABNORMAL HIGH (ref 6–20)
CO2: 24 mmol/L (ref 22–32)
Calcium: 8.5 mg/dL — ABNORMAL LOW (ref 8.9–10.3)
Chloride: 105 mmol/L (ref 98–111)
Creatinine, Ser: 1.73 mg/dL — ABNORMAL HIGH (ref 0.61–1.24)
GFR, Estimated: 45 mL/min — ABNORMAL LOW (ref >60)
Glucose, Bld: 94 mg/dL (ref 70–99)
Potassium: 4.1 mmol/L (ref 3.5–5.1)
Sodium: 135 mmol/L (ref 135–145)

## 2023-08-09 MED ORDER — ATORVASTATIN CALCIUM 10 MG PO TABS: 10.0000 mg | ORAL_TABLET | Freq: Every day | ORAL | 0 refills | Status: DC

## 2023-08-09 MED ORDER — HYDRALAZINE HCL 25 MG PO TABS: 75.0000 mg | ORAL_TABLET | Freq: Three times a day (TID) | ORAL | 0 refills | Status: DC

## 2023-08-09 NOTE — Progress Notes (Signed)
MD order received in [facility] to discharge pt home with home health today; TOC previously reestablished Home Health services with [facility]; verbally reviewed AVS with pt, Rxs escribed to [facility] on [address] in [place], [place]; no questions voiced at this time; pt's discharge pending his eating lunch and his ride arriving for discharge

## 2023-08-09 NOTE — Discharge Instructions (Signed)
[facility]

## 2023-08-09 NOTE — Plan of Care (Signed)
  Problem: Education: Goal: Knowledge of disease or condition will improve Outcome: Progressing   Problem: Intracerebral Hemorrhage Tissue Perfusion: Goal: Complications of Intracerebral Hemorrhage will be minimized Outcome: Progressing   Problem: Coping: Goal: Will verbalize positive feelings about self Outcome: Progressing Goal: Will identify appropriate support needs Outcome: Progressing   Problem: Health Behavior/Discharge Planning: Goal: Ability to manage health-related needs will improve Outcome: Progressing   Problem: Self-Care: Goal: Ability to participate in self-care as condition permits will improve Outcome: Progressing Goal: Ability to communicate needs accurately will improve Outcome: Progressing

## 2023-08-09 NOTE — Progress Notes (Signed)
Patient ambulated with nurse OOF per request to d/c to private vehicle. Ride called to verify he was on the way to pick pt up. About 5-10 min away. Pt requesting to wait outside. Chair provided.

## 2023-08-09 NOTE — Discharge Summary (Signed)
Physician Discharge Summary  Edwin Green ZOX:[id number] DOB: [birth year] DOA: 08/05/2023  PCP: Bethanie Dicker, NP  Admit date: 08/05/2023 Discharge date: 08/09/2023  Admitted From: home  Disposition:  home w/ home health   Recommendations for Outpatient Follow-up:  Follow up with PCP in 1-2 weeks F/u w/ neuro, Dr. Sherryll Burger, in 1-2 weeks   Home Health: yes Equipment/Devices:  Discharge Condition: stable  CODE STATUS: full  Diet recommendation: Heart Healthy  Brief/Interim Summary: HPI was taken from NP Rust-Chester: 62 year old male presenting to [facility] ED from PCP office via EMS on 08/05/2023 for evaluation of hypertension in the setting of difficulty with speech and intermittent confusion.   History obtained per chart review and bedside interview with patient Patient reports that a week ago he had sudden onset difficulty with his speech and some confusion.  His example was extreme difficulty using his phone, difficulty with numbers and right-handed weakness.  He explained he believes his symptoms have somewhat improved over the last few days.  The patient's son last spoke to him at the end of the month reporting that his father had been briefly living with him but they had moved away recently and he is not sure if his father has been taking his medications as prescribed recently. The patient was dropped off at his PCP office by his employer who had concerns that the patient was confused, forgetful with slurred speech.  The employer also spoke with the patient's roommate who reports that he has been unable to walk for about a week and had right-sided weakness.  This was described as using the wall to keep himself upright while attempting to walk.  Patient confirmed with PCP he has been off of all of his medications for about a month.  EMS was called for transport to ED for evaluation of possible stroke. He reports regular ETOH use 1-4 beers/daily, daily tobacco use 1-2 cigarettes daily since age 71  and regular marijuana use. When asked about his anti-hypertensive medications he stated, "I just messed up". He used the previous prescriptions but once they ran out he did not refill them or follow up with his PCP or see cardiology.  He denied SOB, fever/chills, chest pain, blurred vision/ vision changes/ headache, falls, LOC. However he is inconsistent with symptoms which I suspect is secondary to mild aphasia and intermittent mild confusion.   Of note he was admitted in March 2024 due to nosebleeds from uncontrolled hypertension and he was not on any medications at that time.  He was seen outpatient on 04/04/2023 with plans for follow-up appointment in 2 weeks and referral to cardiology.  He does not appear that he has seen a provider since 04/04/2023 prior to today's earlier visit. ED course: Upon arrival patient alert and responsive with mild confusion/memory impairment.  Vital signs initially stable but upon second check patient in hypertensive urgency with BP 232/156.  This was treated with IV as needed without effect and nicardipine drip was initiated.  CT imaging showed 1.3 cm likely intraparenchymal hemorrhage in the left thalamus with local mass effect.  Neurology was urgently consulted and MRI evaluation ordered to rule out underlying mass.  MRI confirmed thalamic bleed with surrounding edema and mild mass effect no underlying mass.  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  Initial NIHSS 6. Medications given: Labetalol, Protonix, nicardipine drip started, IV contrast Initial Vitals: 98.1, 71, 136/78 and 98% on room air  Significant labs: (Labs/ Imaging personally reviewed) I, Cheryll Cockayne Rust-Chester, AGACNP-BC, personally viewed and interpreted this ECG. EKG Interpretation: Date: 08/05/2023, EKG Time: 12:38, Rate: 72, Rhythm: NSR, QRS Axis: LAD Intervals: LVH, prolonged QT, ST/T Wave abnormalities:  Lateral T wave inversions, Narrative Interpretation: NSR with signs of LVH and prolonged QT and lateral T wave inversions that are unchanged from previous EKG Chemistry: Na+: 135, K+: 3.6, BUN/Cr.:  25/2.01 serum CO2/ AG: 23/8 Hematology: WBC: 3.6, Hgb: 13.5,  Troponin: 28 > 25   CXR 08/05/23: small amt of linear scarring of atelectasis at the Left lung base. CT head wo contrast 08/05/23: Ill defied 1.3 cm area of hyperdensity in the left thalamus, suspicious for acute intraparenchymal hemorrhage. Surrounding edema and mild regional mass effect.  MRI brain w/wo contrast, MRA head & neck 08/05/23: 2.2 cm hematoma centered in the left thalamus, with surrounding edema and mild mass effect on the atrium of the left lateral ventricle and third ventricle. No evidence of active extravasation into the hematoma. No hydrocephalus. No intracranial large vessel occlusion or significant stenosis. No hemodynamically significant stenosis in the neck.   PCCM consulted for admission due to hemorrhagic stroke suspect s/t uncontrolled hypertension on nicardipine drip.    Discharge Diagnoses:  Principal Problem:   Stroke, hemorrhagic (HCC) Active Problems:   Intracranial hemorrhage (HCC)  Hemorrhagic CVA: likely secondary to poorly controlled HTN. Hx of medication noncompliance. Pt did receive education on the importance of taking his BP meds as prescribed and pt verbalized his understanding. Continue w/ neuro checks. PT/OT recs HH. Echo shows EF 60-65%, grade I diastolic dysfunction, no regional wall motion abnormalities, no evidence ot interatrial shunt, no PFO, no ASD. Neuro recs apprec   HTN: poorly controlled. Continue on losartan, amlodipine. Increase hydralazine to 75mg  TID and restart coreg. Labetalol prn   Possible AKI on CKDIIIb: Cr is trending down from day prior   Polysubstance abuse: daily alcohol use & marijuana use. Pt denies any alcohol use this past week. Librium prn. Continue on multi-vitamin,  thiamine, folic acie   Tobacco use: received smoking cessation counseling already.   HLD: continue on statin   Pre-DM: HbA1c 5.7. Needs pre-DM education   Discharge Instructions  Discharge Instructions     Diet - low sodium heart healthy   Complete by: As directed    Discharge instructions   Complete by: As directed    F/u w/ PCP in 1-2 weeks. F/u w/ neuro, Dr. Sherryll Burger, in 1-2 weeks   Increase activity slowly   Complete by: As directed       Allergies as of 08/09/2023   No Known Allergies      Medication List     TAKE these medications    amLODipine 10 MG tablet Commonly known as: NORVASC Take 1 tablet (10 mg total) by mouth daily.   atorvastatin 10 MG tablet Commonly known as: LIPITOR Take 1 tablet (10 mg total) by mouth at bedtime.   carvedilol 12.5 MG tablet Commonly known as: COREG Take 3 tablets (37.5 mg total) by mouth 2 (two) times daily with a meal.   hydrALAZINE 25 MG tablet Commonly known as: APRESOLINE Take 3 tablets (75 mg total) by mouth every 8 (eight) hours. What changed:  medication strength how much to take when to take this   losartan 100 MG tablet Commonly known as: COZAAR Take 1 tablet (100 mg total) by mouth daily.        Follow-up Information     Lonell Face,  MD Follow up.   Specialty: Neurology Why: F/u in 1-2 weeks Contact information: [address] [facility] [place] [address] [phone]         Bethanie Dicker, NP Follow up.   Specialty: Nurse Practitioner Why: F/u in 1-2 weeks Contact information: [address]urell [address] [place] [address] [phone]                No Known Allergies  Consultations: ICU Neuro  Neuro surg    Procedures/Studies: ECHOCARDIOGRAM COMPLETE BUBBLE STUDY  Result Date: 08/06/2023    ECHOCARDIOGRAM REPORT   Patient Name:   Edwin Green Date of Exam: 08/06/2023 Medical Rec #:  [id number]       Height:       73.0 in Accession #:     [id number]      Weight:       177.9 lb Date of Birth:  1961/09/18       BSA:          2.047 m Patient Age:    61 years        BP:           157/113 mmHg Patient Gender: M               HR:           80 bpm. Exam Location:  [facility] Procedure: 2D Echo, Cardiac Doppler, Color Doppler and Saline Contrast Bubble            Study Indications:     Stroke 434.91 /I63.9  History:         Patient has prior history of Echocardiogram examinations, most                  recent 02/17/2023. Risk Factors:Hypertension. Hypertrophic                  cardiomyopathy, CKD.  Sonographer:     Cristela Blue Referring Phys:  [id number] Gordy Councilman Diagnosing Phys: Julien Nordmann MD  Sonographer Comments: Image acquisition challenging due to patient body habitus. IMPRESSIONS  1. Left ventricular ejection fraction, by estimation, is 60 to 65%. The left ventricle has normal function. The left ventricle has no regional wall motion abnormalities. There is severe concentric left ventricular hypertrophy. LVOT gradient of 9 mm Hg with valsalva. Left ventricular diastolic parameters are consistent with Grade I diastolic dysfunction (impaired relaxation).  2. Right ventricular systolic function is normal. The right ventricular size is normal. There is normal pulmonary artery systolic pressure. The estimated right ventricular systolic pressure is 18.8 mmHg.  3. The mitral valve is normal in structure. Mild mitral valve regurgitation. No evidence of mitral stenosis.  4. The aortic valve is tricuspid. Aortic valve regurgitation is mild. No aortic stenosis is present.  5. There is mild dilatation of the aortic root, measuring 41 mm.  6. The inferior vena cava is normal in size with greater than 50% respiratory variability, suggesting right atrial pressure of 3 mmHg.  7. Agitated saline contrast bubble study was negative, with no evidence of any interatrial shunt. FINDINGS  Left Ventricle: Left ventricular ejection fraction, by estimation, is 60 to 65%. The  left ventricle has normal function. The left ventricle has no regional wall motion abnormalities. The left ventricular internal cavity size was normal in size. There is  severe concentric left ventricular hypertrophy. Left ventricular diastolic parameters are consistent with Grade I diastolic dysfunction (impaired relaxation). Right Ventricle: The right ventricular size is  normal. No increase in right ventricular wall thickness. Right ventricular systolic function is normal. There is normal pulmonary artery systolic pressure. The tricuspid regurgitant velocity is 1.86 m/s, and  with an assumed right atrial pressure of 5 mmHg, the estimated right ventricular systolic pressure is 18.8 mmHg. Left Atrium: Left atrial size was normal in size. Right Atrium: Right atrial size was normal in size. Pericardium: There is no evidence of pericardial effusion. Mitral Valve: The mitral valve is normal in structure. Mild mitral valve regurgitation. No evidence of mitral valve stenosis. MV peak gradient, 3.7 mmHg. The mean mitral valve gradient is 1.0 mmHg. Tricuspid Valve: The tricuspid valve is normal in structure. Tricuspid valve regurgitation is mild . No evidence of tricuspid stenosis. Aortic Valve: The aortic valve is tricuspid. Aortic valve regurgitation is mild. No aortic stenosis is present. Aortic valve mean gradient measures 3.5 mmHg. Aortic valve peak gradient measures 6.2 mmHg. Aortic valve area, by VTI measures 4.94 cm. Pulmonic Valve: The pulmonic valve was normal in structure. Pulmonic valve regurgitation is not visualized. No evidence of pulmonic stenosis. Aorta: The aortic root is normal in size and structure. There is mild dilatation of the aortic root, measuring 41 mm. Venous: The inferior vena cava is normal in size with greater than 50% respiratory variability, suggesting right atrial pressure of 3 mmHg. IAS/Shunts: No atrial level shunt detected by color flow Doppler. Agitated saline contrast was given  intravenously to evaluate for intracardiac shunting. Agitated saline contrast bubble study was negative, with no evidence of any interatrial shunt. There  is no evidence of a patent foramen ovale. There is no evidence of an atrial septal defect.  LEFT VENTRICLE PLAX 2D LVIDd:         3.80 cm   Diastology LVIDs:         2.00 cm   LV e' medial:    5.87 cm/s LV PW:         2.00 cm   LV E/e' medial:  8.0 LV IVS:        2.40 cm   LV e' lateral:   5.87 cm/s LVOT diam:     2.20 cm   LV E/e' lateral: 8.0 LV SV:         81 LV SV Index:   39 LVOT Area:     3.80 cm  RIGHT VENTRICLE RV Basal diam:  2.40 cm RV Mid diam:    2.20 cm RV S prime:     17.50 cm/s TAPSE (M-mode): 1.7 cm LEFT ATRIUM             Index        RIGHT ATRIUM           Index LA diam:        3.70 cm 1.81 cm/m   RA Area:     11.40 cm LA Vol (A2C):   23.1 ml 11.28 ml/m  RA Volume:   16.50 ml  8.06 ml/m LA Vol (A4C):   22.0 ml 10.75 ml/m LA Biplane Vol: 22.7 ml 11.09 ml/m  AORTIC VALVE AV Area (Vmax):    4.23 cm AV Area (Vmean):   4.22 cm AV Area (VTI):     4.94 cm AV Vmax:           124.00 cm/s AV Vmean:          87.400 cm/s AV VTI:            0.163 m AV Peak Grad:  6.2 mmHg AV Mean Grad:      3.5 mmHg LVOT Vmax:         138.00 cm/s LVOT Vmean:        97.000 cm/s LVOT VTI:          0.212 m LVOT/AV VTI ratio: 1.30  AORTA Ao Root diam: 3.98 cm MITRAL VALVE               TRICUSPID VALVE MV Area (PHT): 4.74 cm    TR Peak grad:   13.8 mmHg MV Area VTI:   3.30 cm    TR Vmax:        186.00 cm/s MV Peak grad:  3.7 mmHg MV Mean grad:  1.0 mmHg    SHUNTS MV Vmax:       0.97 m/s    Systemic VTI:  0.21 m MV Vmean:      53.6 cm/s   Systemic Diam: 2.20 cm MV Decel Time: 160 msec MV E velocity: 47.20 cm/s MV A velocity: 90.70 cm/s MV E/A ratio:  0.52 Julien Nordmann MD Electronically signed by Julien Nordmann MD Signature Date/Time: 08/06/2023/11:49:49 AM    Final    MR Brain W and Wo Contrast  Result Date: 08/05/2023 CLINICAL DATA:  Headache hypertension,  hemorrhage on head CT EXAM: MRI HEAD WITHOUT AND WITH CONTRAST MRA HEAD WITHOUT CONTRAST MRA NECK WITHOUT AND WITH CONTRAST TECHNIQUE: Multiplanar, multi-echo pulse sequences of the brain and surrounding structures were acquired without and with intravenous contrast. Angiographic images of the Circle of Willis were acquired using MRA technique without intravenous contrast. Angiographic images of the neck were acquired using MRA technique without and with intravenous contrast. Carotid stenosis measurements (when applicable) are obtained utilizing NASCET criteria, using the distal internal carotid diameter as the denominator. CONTRAST:  8mL GADAVIST GADOBUTROL 1 MMOL/ML IV SOLN COMPARISON:  No prior MRI or MRA available, correlation is made with CT head 08/05/2023 FINDINGS: MRI HEAD FINDINGS Brain: 2.2 x 1.9 x 1.9 cm hematoma centered in the left thalamus (AP x TR x CC) (series 9, image 15 and series 17, image 88), as seen on the same-day head CT. The blood products are peripherally T1 hyperintense and centrally T1 hypointense, and primarily T2 hyperintense, likely subacute blood products. Surrounding T2 hyperintense signal, most likely edema, with mass effect on the atrium of the left lateral ventricle and on the third ventricle. No evidence of active extravasation into the hematoma. Additional foci of hemosiderin deposition are noted in the right cerebellum, pons, and right caudate head, likely sequela of prior hypertensive microhemorrhages. No abnormal parenchymal or meningeal enhancement. No acute infarct, mass, or midline shift. No hydrocephalus or extra-axial collection. No evidence of intraventricular hemorrhage. Normal craniocervical junction. T2 hyperintense signal in the periventricular white matter, likely the sequela of chronic small vessel ischemic disease. Vascular: Please see MRA findings below. Normal arterial and venous enhancement. Skull and upper cervical spine: Normal marrow signal. Sinuses/Orbits:  Clear paranasal sinuses. No acute finding in the orbits. Other: The mastoid air cells are well aerated. MRA HEAD FINDINGS Anterior circulation: Both internal carotid arteries are patent to the termini, without significant stenosis. A1 segments patent. Normal anterior communicating artery. Anterior cerebral arteries are patent to their distal aspects without significant stenosis. No M1 stenosis or occlusion. Distal MCA branches perfused to their distal aspects without significant stenosis. Posterior circulation: Vertebral arteries patent to the vertebrobasilar junction without stenosis. Basilar patent to its distal aspect. Superior cerebellar arteries patent proximally. Patent P1 segments. Near fetal  origin of the right PCA from the right posterior communicating artery. The left posterior communicating artery is also patent. PCAs perfused to their distal aspects without significant stenosis. Anatomic variants: Near fetal origin of the right PCA. MRA NECK FINDINGS Aortic arch: Two-vessel arch with a common origin of the brachiocephalic and left common carotid arteries. Imaged portion shows no evidence of aneurysm or dissection. No significant stenosis of the major arch vessel origins. Right carotid system: No evidence of dissection, occlusion, or hemodynamically significant stenosis (greater than 50%). Left carotid system: No evidence of dissection, occlusion, or hemodynamically significant stenosis (greater than 50%). Vertebral arteries: No evidence of dissection, occlusion, or hemodynamically significant stenosis (greater than 50%). Other: None IMPRESSION: 1. 2.2 cm hematoma centered in the left thalamus, with surrounding edema and mild mass effect on the atrium of the left lateral ventricle and third ventricle. No evidence of active extravasation into the hematoma. No hydrocephalus. 2. No intracranial large vessel occlusion or significant stenosis. 3. No hemodynamically significant stenosis in the neck.  Electronically Signed   By: Wiliam Ke M.D.   On: 08/05/2023 20:14   MR ANGIO HEAD WO CONTRAST  Result Date: 08/05/2023 CLINICAL DATA:  Headache hypertension, hemorrhage on head CT EXAM: MRI HEAD WITHOUT AND WITH CONTRAST MRA HEAD WITHOUT CONTRAST MRA NECK WITHOUT AND WITH CONTRAST TECHNIQUE: Multiplanar, multi-echo pulse sequences of the brain and surrounding structures were acquired without and with intravenous contrast. Angiographic images of the Circle of Willis were acquired using MRA technique without intravenous contrast. Angiographic images of the neck were acquired using MRA technique without and with intravenous contrast. Carotid stenosis measurements (when applicable) are obtained utilizing NASCET criteria, using the distal internal carotid diameter as the denominator. CONTRAST:  8mL GADAVIST GADOBUTROL 1 MMOL/ML IV SOLN COMPARISON:  No prior MRI or MRA available, correlation is made with CT head 08/05/2023 FINDINGS: MRI HEAD FINDINGS Brain: 2.2 x 1.9 x 1.9 cm hematoma centered in the left thalamus (AP x TR x CC) (series 9, image 15 and series 17, image 88), as seen on the same-day head CT. The blood products are peripherally T1 hyperintense and centrally T1 hypointense, and primarily T2 hyperintense, likely subacute blood products. Surrounding T2 hyperintense signal, most likely edema, with mass effect on the atrium of the left lateral ventricle and on the third ventricle. No evidence of active extravasation into the hematoma. Additional foci of hemosiderin deposition are noted in the right cerebellum, pons, and right caudate head, likely sequela of prior hypertensive microhemorrhages. No abnormal parenchymal or meningeal enhancement. No acute infarct, mass, or midline shift. No hydrocephalus or extra-axial collection. No evidence of intraventricular hemorrhage. Normal craniocervical junction. T2 hyperintense signal in the periventricular white matter, likely the sequela of chronic small vessel  ischemic disease. Vascular: Please see MRA findings below. Normal arterial and venous enhancement. Skull and upper cervical spine: Normal marrow signal. Sinuses/Orbits: Clear paranasal sinuses. No acute finding in the orbits. Other: The mastoid air cells are well aerated. MRA HEAD FINDINGS Anterior circulation: Both internal carotid arteries are patent to the termini, without significant stenosis. A1 segments patent. Normal anterior communicating artery. Anterior cerebral arteries are patent to their distal aspects without significant stenosis. No M1 stenosis or occlusion. Distal MCA branches perfused to their distal aspects without significant stenosis. Posterior circulation: Vertebral arteries patent to the vertebrobasilar junction without stenosis. Basilar patent to its distal aspect. Superior cerebellar arteries patent proximally. Patent P1 segments. Near fetal origin of the right PCA from the right posterior communicating artery. The  left posterior communicating artery is also patent. PCAs perfused to their distal aspects without significant stenosis. Anatomic variants: Near fetal origin of the right PCA. MRA NECK FINDINGS Aortic arch: Two-vessel arch with a common origin of the brachiocephalic and left common carotid arteries. Imaged portion shows no evidence of aneurysm or dissection. No significant stenosis of the major arch vessel origins. Right carotid system: No evidence of dissection, occlusion, or hemodynamically significant stenosis (greater than 50%). Left carotid system: No evidence of dissection, occlusion, or hemodynamically significant stenosis (greater than 50%). Vertebral arteries: No evidence of dissection, occlusion, or hemodynamically significant stenosis (greater than 50%). Other: None IMPRESSION: 1. 2.2 cm hematoma centered in the left thalamus, with surrounding edema and mild mass effect on the atrium of the left lateral ventricle and third ventricle. No evidence of active extravasation  into the hematoma. No hydrocephalus. 2. No intracranial large vessel occlusion or significant stenosis. 3. No hemodynamically significant stenosis in the neck. Electronically Signed   By: Wiliam Ke M.D.   On: 08/05/2023 20:14   MR ANGIO NECK W WO CONTRAST  Result Date: 08/05/2023 CLINICAL DATA:  Headache hypertension, hemorrhage on head CT EXAM: MRI HEAD WITHOUT AND WITH CONTRAST MRA HEAD WITHOUT CONTRAST MRA NECK WITHOUT AND WITH CONTRAST TECHNIQUE: Multiplanar, multi-echo pulse sequences of the brain and surrounding structures were acquired without and with intravenous contrast. Angiographic images of the Circle of Willis were acquired using MRA technique without intravenous contrast. Angiographic images of the neck were acquired using MRA technique without and with intravenous contrast. Carotid stenosis measurements (when applicable) are obtained utilizing NASCET criteria, using the distal internal carotid diameter as the denominator. CONTRAST:  8mL GADAVIST GADOBUTROL 1 MMOL/ML IV SOLN COMPARISON:  No prior MRI or MRA available, correlation is made with CT head 08/05/2023 FINDINGS: MRI HEAD FINDINGS Brain: 2.2 x 1.9 x 1.9 cm hematoma centered in the left thalamus (AP x TR x CC) (series 9, image 15 and series 17, image 88), as seen on the same-day head CT. The blood products are peripherally T1 hyperintense and centrally T1 hypointense, and primarily T2 hyperintense, likely subacute blood products. Surrounding T2 hyperintense signal, most likely edema, with mass effect on the atrium of the left lateral ventricle and on the third ventricle. No evidence of active extravasation into the hematoma. Additional foci of hemosiderin deposition are noted in the right cerebellum, pons, and right caudate head, likely sequela of prior hypertensive microhemorrhages. No abnormal parenchymal or meningeal enhancement. No acute infarct, mass, or midline shift. No hydrocephalus or extra-axial collection. No evidence of  intraventricular hemorrhage. Normal craniocervical junction. T2 hyperintense signal in the periventricular white matter, likely the sequela of chronic small vessel ischemic disease. Vascular: Please see MRA findings below. Normal arterial and venous enhancement. Skull and upper cervical spine: Normal marrow signal. Sinuses/Orbits: Clear paranasal sinuses. No acute finding in the orbits. Other: The mastoid air cells are well aerated. MRA HEAD FINDINGS Anterior circulation: Both internal carotid arteries are patent to the termini, without significant stenosis. A1 segments patent. Normal anterior communicating artery. Anterior cerebral arteries are patent to their distal aspects without significant stenosis. No M1 stenosis or occlusion. Distal MCA branches perfused to their distal aspects without significant stenosis. Posterior circulation: Vertebral arteries patent to the vertebrobasilar junction without stenosis. Basilar patent to its distal aspect. Superior cerebellar arteries patent proximally. Patent P1 segments. Near fetal origin of the right PCA from the right posterior communicating artery. The left posterior communicating artery is also patent. PCAs perfused to their  distal aspects without significant stenosis. Anatomic variants: Near fetal origin of the right PCA. MRA NECK FINDINGS Aortic arch: Two-vessel arch with a common origin of the brachiocephalic and left common carotid arteries. Imaged portion shows no evidence of aneurysm or dissection. No significant stenosis of the major arch vessel origins. Right carotid system: No evidence of dissection, occlusion, or hemodynamically significant stenosis (greater than 50%). Left carotid system: No evidence of dissection, occlusion, or hemodynamically significant stenosis (greater than 50%). Vertebral arteries: No evidence of dissection, occlusion, or hemodynamically significant stenosis (greater than 50%). Other: None IMPRESSION: 1. 2.2 cm hematoma centered in  the left thalamus, with surrounding edema and mild mass effect on the atrium of the left lateral ventricle and third ventricle. No evidence of active extravasation into the hematoma. No hydrocephalus. 2. No intracranial large vessel occlusion or significant stenosis. 3. No hemodynamically significant stenosis in the neck. Electronically Signed   By: Wiliam Ke M.D.   On: 08/05/2023 20:14   DG Abd 1 View  Result Date: 08/05/2023 CLINICAL DATA:  Altered mental status.  MRI clearance. EXAM: ABDOMEN - 1 VIEW COMPARISON:  Chest radiograph obtained at the same time, including the upper abdomen. FINDINGS: Normal bowel-gas pattern. No metallic foreign bodies. Lumbar spine degenerative changes. IMPRESSION: 1. No metallic foreign bodies. 2. No acute abnormality. Electronically Signed   By: Beckie Salts M.D.   On: 08/05/2023 16:40   DG Chest Port 1 View  Result Date: 08/05/2023 CLINICAL DATA:  Altered mental status.  MRI clearance. EXAM: PORTABLE CHEST 1 VIEW COMPARISON:  06/26/2019 FINDINGS: Interval poor inspiration and borderline enlargement of the cardiac silhouette. Interval small amount of linear scarring or atelectasis at the left lung base. Otherwise, clear lungs. Unremarkable bones. No metallic foreign bodies. IMPRESSION: 1. No metallic foreign bodies. 2. Interval small amount of linear scarring or atelectasis at the left lung base. 3. Interval poor inspiration and borderline cardiomegaly. Electronically Signed   By: Beckie Salts M.D.   On: 08/05/2023 16:39   CT HEAD WO CONTRAST ( )  Result Date: 08/05/2023 CLINICAL DATA:  Headache, neuro deficit EXAM: CT HEAD WITHOUT CONTRAST TECHNIQUE: Contiguous axial images were obtained from the base of the skull through the vertex without intravenous contrast. RADIATION DOSE REDUCTION: This exam was performed according to the departmental dose-optimization program which includes automated exposure control, adjustment of the mA and/or kV according to patient size  and/or use of iterative reconstruction technique. COMPARISON:  None Available. FINDINGS: Brain: Ill defied 1.3 cm area of hyperdensity in the left thalamus, suspicious for acute intraparenchymal hemorrhage. There is surrounding edema and mild regional mass effect. No evidence of hydrocephalus or extra-axial fluid collection. Vascular: No hyperdense vessel. Skull: No acute fracture. Sinuses/Orbits: Clear sinuses.  No acute orbital findings. IMPRESSION: Ill defied 1.3 cm area of hyperdensity in the left thalamus, suspicious for acute intraparenchymal hemorrhage. Surrounding edema and mild regional mass effect. Recommend MRI head to further characterize and to evaluate for an etiology (such as infarct). Code stroke imaging results were communicated on 08/05/2023 at 1:25 pm to provider PADUCHOWSKI via telephone, who verbally acknowledged these results. Electronically Signed   By: Feliberto Harts M.D.   On: 08/05/2023 13:26   (Echo, Carotid, EGD, Colonoscopy, ERCP)    Subjective: Pt c/o fatigue   Discharge Exam: Vitals:   08/09/23 0733 08/09/23 0820  BP: (!) 169/122 (!) 166/96  Pulse: 67   Resp:    Temp:    SpO2: 100%    Vitals:   08/09/23 0500  08/09/23 0731 08/09/23 0733 08/09/23 0820  BP:  (!) 199/120 (!) 169/122 (!) 166/96  Pulse:  70 67   Resp:  12    Temp:  98.2 F (36.8 C)    TempSrc:  Oral    SpO2:  100% 100%   Weight: 84.2 kg     Height:        General: Pt is alert, awake, not in acute distress Cardiovascular: S1/S2 +, no rubs, no gallops Respiratory: CTA bilaterally, no wheezing, no rhonchi Abdominal: Soft, NT, ND, bowel sounds + Extremities: no edema, no cyanosis    The results of significant diagnostics from this hospitalization (including imaging, microbiology, ancillary and laboratory) are listed below for reference.     Microbiology: Recent Results (from the past 240 hour(s))  MRSA Next Gen by PCR, Nasal     Status: None   Collection Time: 08/06/23  2:46 AM    Specimen: Nasal Mucosa; Nasal Swab  Result Value Ref Range Status   MRSA by PCR Next Gen NOT DETECTED NOT DETECTED Final    Comment: (NOTE) The GeneXpert MRSA Assay (FDA approved for NASAL specimens only), is one component of a comprehensive MRSA colonization surveillance program. It is not intended to diagnose MRSA infection nor to guide or monitor treatment for MRSA infections. Test performance is not FDA approved in patients less than 2 years old. Performed at [facility], [address] [place], [place] [address]      Labs: BNP (last 3 results) No results for input(s): "BNP" in the last 8760 hours. Basic Metabolic Panel: Recent Labs  Lab 08/05/23 1235 08/06/23 0620 08/07/23 0316 08/08/23 0242 08/09/23 0515  NA 135 137 136 137 135  K 3.6 3.6 3.5 3.9 4.1  CL 104 112* 108 107 105  CO2 23 22 22 25 24   GLUCOSE 95 101* 122* 117* 94  BUN 25* 20 22* 21* 23*  CREATININE 2.01* 1.68* 1.86* 1.82* 1.73*  CALCIUM 8.6* 8.1* 8.2* 8.4* 8.5*  MG  --  2.1 2.1  --   --   PHOS  --  2.9 2.8  --   --    Liver Function Tests: Recent Labs  Lab 08/07/23 0316  ALBUMIN 3.1*   No results for input(s): "LIPASE", "AMYLASE" in the last 168 hours. No results for input(s): "AMMONIA" in the last 168 hours. CBC: Recent Labs  Lab 08/05/23 1235 08/06/23 0620 08/07/23 0316  WBC 3.6* 3.3* 4.1  HGB 13.5 13.6 13.0  HCT 40.3 40.2 38.6*  MCV 90.8 88.9 89.6  PLT 228 228 198   Cardiac Enzymes: No results for input(s): "CKTOTAL", "CKMB", "CKMBINDEX", "TROPONINI" in the last 168 hours. BNP: Invalid input(s): "POCBNP" CBG: Recent Labs  Lab 08/06/23 0241  GLUCAP 167*   D-Dimer No results for input(s): "DDIMER" in the last 72 hours. Hgb A1c No results for input(s): "HGBA1C" in the last 72 hours. Lipid Profile No results for input(s): "CHOL", "HDL", "LDLCALC", "TRIG", "CHOLHDL", "LDLDIRECT" in the last 72 hours. Thyroid function studies No results for input(s): "TSH", "T4TOTAL",  "T3FREE", "THYROIDAB" in the last 72 hours.  Invalid input(s): "FREET3" Anemia work up No results for input(s): "VITAMINB12", "FOLATE", "FERRITIN", "TIBC", "IRON", "RETICCTPCT" in the last 72 hours. Urinalysis    Component Value Date/Time   COLORURINE YELLOW (A) 08/05/2023 1446   APPEARANCEUR CLEAR (A) 08/05/2023 1446   APPEARANCEUR Clear 02/04/2018 1034   LABSPEC 1.013 08/05/2023 1446   PHURINE 5.0 08/05/2023 1446   GLUCOSEU NEGATIVE 08/05/2023 1446   HGBUR NEGATIVE  08/05/2023 1446   BILIRUBINUR NEGATIVE 08/05/2023 1446   BILIRUBINUR Negative 02/04/2018 1034   KETONESUR NEGATIVE 08/05/2023 1446   PROTEINUR NEGATIVE 08/05/2023 1446   NITRITE NEGATIVE 08/05/2023 1446   LEUKOCYTESUR NEGATIVE 08/05/2023 1446   Sepsis Labs Recent Labs  Lab 08/05/23 1235 08/06/23 0620 08/07/23 0316  WBC 3.6* 3.3* 4.1   Microbiology Recent Results (from the past 240 hour(s))  MRSA Next Gen by PCR, Nasal     Status: None   Collection Time: 08/06/23  2:46 AM   Specimen: Nasal Mucosa; Nasal Swab  Result Value Ref Range Status   MRSA by PCR Next Gen NOT DETECTED NOT DETECTED Final    Comment: (NOTE) The GeneXpert MRSA Assay (FDA approved for NASAL specimens only), is one component of a comprehensive MRSA colonization surveillance program. It is not intended to diagnose MRSA infection nor to guide or monitor treatment for MRSA infections. Test performance is not FDA approved in patients less than 46 years old. Performed at [facility], [address], [place], [place] [address]      Time coordinating discharge: Over 30 minutes  SIGNED:   Charise Killian, MD  [facility] 08/09/2023, 12:35 PM Pager   If 7PM-7AM, please contact night-coverage [email or web address]

## 2023-08-09 NOTE — TOC Transition Note (Signed)
Transition of Care [facility]) - CM/SW Discharge Note   Patient Details  Name: Edwin Green MRN: [id number] Date of Birth: 16-Jan-1961  [facility]) CM/SW Contact:  Allena Katz, LCSW Phone Number: 08/09/2023, 12:37 PM   Clinical Narrative:   Pt discharging today. CSW notified [place] with [facility].     Final next level of care: Home w Home Health Services Barriers to Discharge: Continued Medical Work up   Patient Goals and CMS Choice CMS Medicare.gov Compare Post Acute Care list provided to:: Patient Choice offered to / list presented to : Patient  Discharge Placement                         Discharge Plan and Services Additional resources added to the After Visit Summary for       Post Acute Care Choice: Home Health                      [facility] Agency: [facility] Date [facility] Agency Contacted: 08/07/23 Time HH Agency Contacted: 1356 Representative spoke with at [facility] Agency: [place] [facility]  Social Determinants of Health (SDOH) Interventions SDOH Screenings   Food Insecurity: No Food Insecurity (08/06/2023)  Housing: Low Risk  (08/06/2023)  Transportation Needs: No Transportation Needs (08/06/2023)  Utilities: Not At Risk (08/06/2023)  Depression (PHQ2-9): High Risk (04/04/2023)  Tobacco Use: High Risk (08/05/2023)     Readmission Risk Interventions     No data to display

## 2023-08-09 NOTE — Progress Notes (Signed)
Mobility Specialist - Progress Note   08/09/23 0857  Mobility  Activity Ambulated independently in hallway;Stood at bedside;Dangled on edge of bed  Level of Assistance Independent  Assistive Device None  Distance Ambulated (ft) 200 ft  Activity Response Tolerated well  Mobility Referral Yes  $Mobility charge 1 Mobility  Mobility Specialist Start Time (ACUTE ONLY) 0845  Mobility Specialist Stop Time (ACUTE ONLY) 0855  Mobility Specialist Time Calculation (min) (ACUTE ONLY) 10 min   Pt sitting on EOB on RA upon arrival. Pt STS and ambulates in hallway indep with no LOB noted. Pt returns to EOB with needs in reach.   Terrilyn Saver  Mobility Specialist  08/09/23 8:59 AM

## 2023-08-09 NOTE — Plan of Care (Signed)
  Problem: Education: Goal: Knowledge of disease or condition will improve Outcome: Adequate for Discharge Goal: Knowledge of secondary prevention will improve (MUST DOCUMENT ALL) Outcome: Adequate for Discharge Goal: Knowledge of patient specific risk factors will improve Loraine Leriche N/A or DELETE if not current risk factor) Outcome: Adequate for Discharge   Problem: Intracerebral Hemorrhage Tissue Perfusion: Goal: Complications of Intracerebral Hemorrhage will be minimized Outcome: Adequate for Discharge   Problem: Coping: Goal: Will verbalize positive feelings about self Outcome: Adequate for Discharge Goal: Will identify appropriate support needs Outcome: Adequate for Discharge   Problem: Health Behavior/Discharge Planning: Goal: Ability to manage health-related needs will improve Outcome: Adequate for Discharge Goal: Goals will be collaboratively established with patient/family Outcome: Adequate for Discharge   Problem: Self-Care: Goal: Ability to participate in self-care as condition permits will improve Outcome: Adequate for Discharge Goal: Ability to communicate needs accurately will improve Outcome: Adequate for Discharge   Problem: Education: Goal: Knowledge of General Education information will improve Description: Including pain rating scale, medication(s)/side effects and non-pharmacologic comfort measures Outcome: Adequate for Discharge   Problem: Health Behavior/Discharge Planning: Goal: Ability to manage health-related needs will improve Outcome: Adequate for Discharge   Problem: Clinical Measurements: Goal: Ability to maintain clinical measurements within normal limits will improve Outcome: Adequate for Discharge Goal: Will remain free from infection Outcome: Adequate for Discharge Goal: Diagnostic test results will improve Outcome: Adequate for Discharge Goal: Cardiovascular complication will be avoided Outcome: Adequate for Discharge   Problem:  Safety: Goal: Ability to remain free from injury will improve Outcome: Adequate for Discharge   Problem: Education: Goal: Knowledge of disease or condition will improve Outcome: Adequate for Discharge Goal: Knowledge of secondary prevention will improve (MUST DOCUMENT ALL) Outcome: Adequate for Discharge Goal: Knowledge of patient specific risk factors will improve Loraine Leriche N/A or DELETE if not current risk factor) Outcome: Adequate for Discharge   Problem: Intracerebral Hemorrhage Tissue Perfusion: Goal: Complications of Intracerebral Hemorrhage will be minimized Outcome: Adequate for Discharge   Problem: Coping: Goal: Will verbalize positive feelings about self Outcome: Adequate for Discharge Goal: Will identify appropriate support needs Outcome: Adequate for Discharge   Problem: Health Behavior/Discharge Planning: Goal: Ability to manage health-related needs will improve Outcome: Adequate for Discharge Goal: Goals will be collaboratively established with patient/family Outcome: Adequate for Discharge   Problem: Self-Care: Goal: Ability to participate in self-care as condition permits will improve Outcome: Adequate for Discharge Goal: Verbalization of feelings and concerns over difficulty with self-care will improve Outcome: Adequate for Discharge Goal: Ability to communicate needs accurately will improve Outcome: Adequate for Discharge   Problem: Nutrition: Goal: Risk of aspiration will decrease Outcome: Adequate for Discharge Goal: Dietary intake will improve Outcome: Adequate for Discharge

## 2023-08-11 ENCOUNTER — Telehealth: Payer: Self-pay

## 2023-08-11 NOTE — Transitions of Care (Post Inpatient/ED Visit) (Unsigned)
   08/11/2023  Name: Edwin Green MRN: [id number] DOB: [birth year]  Today's TOC FU Call Status: Today's TOC FU Call Status:: Unsuccessful Call (1st Attempt) Unsuccessful Call (1st Attempt) Date: 08/11/23  Attempted to reach the patient regarding the most recent Inpatient/ED visit.  Follow Up Plan: Additional outreach attempts will be made to reach the patient to complete the Transitions of Care (Post Inpatient/ED visit) call.   Signature Karena Addison, LPN [facility] Nurse Health Advisor Direct Dial [phone]

## 2023-08-12 NOTE — Transitions of Care (Post Inpatient/ED Visit) (Unsigned)
   08/12/2023  Name: Edwin Green MRN: [id number] DOB: Mar 23, 1961  Today's TOC FU Call Status: Today's TOC FU Call Status:: Unsuccessful Call (2nd Attempt) Unsuccessful Call (1st Attempt) Date: 08/11/23 Unsuccessful Call (2nd Attempt) Date: 08/12/23  Attempted to reach the patient regarding the most recent Inpatient/ED visit.  Follow Up Plan: Additional outreach attempts will be made to reach the patient to complete the Transitions of Care (Post Inpatient/ED visit) call.   Signature Karena Addison, LPN [facility] Nurse Health Advisor Direct Dial [phone]

## 2023-08-13 NOTE — Transitions of Care (Post Inpatient/ED Visit) (Signed)
   08/13/2023  Name: Excell Bostock MRN: [id number] DOB: 05-06-1961  Today's TOC FU Call Status: Today's TOC FU Call Status:: Unsuccessful Call (3rd Attempt) Unsuccessful Call (1st Attempt) Date: 08/11/23 Unsuccessful Call (2nd Attempt) Date: 08/12/23 Unsuccessful Call (3rd Attempt) Date: 08/13/23  Attempted to reach the patient regarding the most recent Inpatient/ED visit.  Follow Up Plan: No further outreach attempts will be made at this time. We have been unable to contact the patient.  Signature  Karena Addison, LPN [facility] Nurse Health Advisor Direct Dial [phone]

## 2023-08-21 ENCOUNTER — Telehealth: Payer: Self-pay | Admitting: Nurse Practitioner

## 2023-08-21 NOTE — Telephone Encounter (Signed)
Physical therapy just called.He said he need verbal orders for the pt. Frequency#  [id number]   Phone number is [phone]

## 2023-08-21 NOTE — Telephone Encounter (Signed)
Verbal orders given  

## 2023-08-22 ENCOUNTER — Telehealth: Payer: Self-pay

## 2023-08-22 NOTE — Telephone Encounter (Signed)
Shireen Miller called from [employer] to state they are going to do home health occupational therapy with patient.  Shireen states patient is agreeable.  Shireen states orders are being faxed to [place].

## 2023-08-25 ENCOUNTER — Telehealth: Payer: Self-pay

## 2023-08-25 NOTE — Telephone Encounter (Signed)
A vital sign alert report was received from [facility], placed in provider to be signed folder to have provider review and sign off for scan

## 2023-08-26 ENCOUNTER — Telehealth: Payer: Self-pay

## 2023-08-26 NOTE — Telephone Encounter (Signed)
Placed in basket to be scanned to chart

## 2023-08-26 NOTE — Telephone Encounter (Signed)
Edwin Green, patient's son (not on [place]), called from [place] to state patient recently had a stroke.  [name] states he would like to take an intermittent leave from work to come back and help take care of patient.  Elijah states patient is currently not remembering things and he is concerned that he will forget to take his medications.  [name] states he would like to know if we would be able to complete a leave of absence form for him.  I spoke with Donavan Foil, CMA, regarding this issue.  Alvino Blood states patient will need to come in and fill out a DPR before we can fill out a leave of absence form.  I relayed message to [facility].  I let Elijah know that he can have patient call us to schedule an appointment with Bethanie Dicker, NP.  I tried to call patient but his phone is not in service.

## 2023-08-26 NOTE — Telephone Encounter (Signed)
[facility] orders have been received needing provider signature. Forms have been placed in provider to be signed folder.

## 2023-08-27 ENCOUNTER — Telehealth: Payer: Self-pay | Admitting: Nurse Practitioner

## 2023-08-27 NOTE — Telephone Encounter (Signed)
Reita May from [employer] called wanting additional information regarding pt cerebral infracture Reference #[id number]

## 2023-08-28 ENCOUNTER — Inpatient Hospital Stay: Payer: No Typology Code available for payment source | Admitting: Nurse Practitioner

## 2023-08-29 NOTE — Telephone Encounter (Signed)
Called [employer] to speak with Reita May and was transferred to representative [place]. I informed her that I was calling to give the information Bethanie Dicker, NP has not seen patient for a hospital follow up since his stroke and that the only information she has is from the hospital. Pietro Cassis asked if I could possible answer some questions. She asked what the diagnosis codes for his event, when was his last day of work, what is his return to work date how long was he in the hospital for and what are the treatment plan and/or medications at discharge. I with the help of Arville Lime was able to the hospital dates patient was in hospital, gave date that patient was dropped off to our office, and the plan of home health OT, follow up with PCP and Neurology. She thanked me for calling and stated that if any other information is needed someone will give our office a call back.

## 2023-09-02 ENCOUNTER — Telehealth: Payer: Self-pay

## 2023-09-02 NOTE — Telephone Encounter (Signed)
2 [facility] forms have been received that need provider signature. They have both been placed in provider to be signed folder

## 2023-09-05 ENCOUNTER — Ambulatory Visit (INDEPENDENT_AMBULATORY_CARE_PROVIDER_SITE_OTHER): Payer: 59 | Admitting: Nurse Practitioner

## 2023-09-05 ENCOUNTER — Encounter: Payer: Self-pay | Admitting: Nurse Practitioner

## 2023-09-05 VITALS — BP 130/88 | HR 62 | Temp 98.4°F | Ht 73.0 in | Wt 182.2 lb

## 2023-09-05 DIAGNOSIS — I619 Nontraumatic intracerebral hemorrhage, unspecified: Secondary | ICD-10-CM | POA: Diagnosis not present

## 2023-09-05 DIAGNOSIS — I10 Essential (primary) hypertension: Secondary | ICD-10-CM | POA: Diagnosis not present

## 2023-09-05 NOTE — Assessment & Plan Note (Addendum)
BP stable today. Counseled on importance of mediation compliance. He will continue his current medication regimen. Has not had follow up with Neurology. Office called and appointment scheduled for patient on September 18, 2023. He will continue home PT, OT, and Speech Therapy. Encouraged follow up as scheduled.

## 2023-09-05 NOTE — Telephone Encounter (Signed)
Forms have been faxed to [phone] with a completed transmission log

## 2023-09-05 NOTE — Assessment & Plan Note (Signed)
Chronic issue. Stable today in office. He will continue Norvasc 10 mg daily, Carvedilol 37.5 mg BID, Losartan 100 mg daily and Hydralazine 75 mg TID. Encouraged to continue monitoring blood pressure at home. Home health to contact if elevated. Will continue to monitor.

## 2023-09-05 NOTE — Progress Notes (Signed)
Bethanie Dicker, NP-C Phone: [phone]  Edwin Green is a 62 y.o. male who presents today for hospital follow up.   Patient hospitalized on 08/05/2023 for hemorrhagic stroke, intracranial hemorrhage. He was discharged on 08/09/2023. Likely secondary to poorly controlled hypertension as he had been noncompliant with his medications. He currently has home health, PT, OT and Speech Therapy coming weekly. He has mild right upper extremity weakness and difficulty finding words. He has not had a follow up appointment with Neurology.  HYPERTENSION- Patient reports being compliant with his medications.  Disease Monitoring Home BP Monitoring- Home health checking Chest pain- No    Dyspnea- No Medications Compliance-  Norvasc, Coreg, Losartan, Hydralazine. Lightheadedness-  No  Edema- No BMET    Component Value Date/Time   NA 135 08/09/2023 0515   NA 141 02/04/2018 1034   K 4.1 08/09/2023 0515   CL 105 08/09/2023 0515   CO2 24 08/09/2023 0515   GLUCOSE 94 08/09/2023 0515   BUN 23 (H) 08/09/2023 0515   BUN 16 02/04/2018 1034   CREATININE 1.73 (H) 08/09/2023 0515   CALCIUM 8.5 (L) 08/09/2023 0515   GFRNONAA 45 (L) 08/09/2023 0515   GFRAA >60 06/29/2019 0542    Social History   Tobacco Use  Smoking Status Some Days   Types: Cigarettes  Smokeless Tobacco Never    Current Outpatient Medications on File Prior to Visit  Medication Sig Dispense Refill   amLODipine (NORVASC) 10 MG tablet Take 1 tablet (10 mg total) by mouth daily. 90 tablet 1   atorvastatin (LIPITOR) 10 MG tablet Take 1 tablet (10 mg total) by mouth at bedtime. 30 tablet 0   carvedilol (COREG) 12.5 MG tablet Take 3 tablets (37.5 mg total) by mouth 2 (two) times daily with a meal. 540 tablet 1   hydrALAZINE (APRESOLINE) 25 MG tablet Take 3 tablets (75 mg total) by mouth every 8 (eight) hours. 90 tablet 0   losartan (COZAAR) 100 MG tablet Take 1 tablet (100 mg total) by mouth daily. 90 tablet 1   No current  facility-administered medications on file prior to visit.    ROS see history of present illness  Objective  Physical Exam Vitals:   09/05/23 1056  BP: 130/88  Pulse: 62  Temp: 98.4 F (36.9 C)  SpO2: 98%    BP Readings from Last 3 Encounters:  09/05/23 130/88  08/09/23 (!) 166/96  08/05/23 136/78   Wt Readings from Last 3 Encounters:  09/05/23 182 lb 3.2 oz (82.6 kg)  08/09/23 185 lb 10 oz (84.2 kg)  08/05/23 181 lb (82.1 kg)    Physical Exam Constitutional:      General: He is not in acute distress.    Appearance: Normal appearance.  HENT:     Head: Normocephalic.  Cardiovascular:     Rate and Rhythm: Normal rate and regular rhythm.     Heart sounds: Normal heart sounds.  Pulmonary:     Effort: Pulmonary effort is normal.     Breath sounds: Normal breath sounds.  Skin:    General: Skin is warm and dry.  Neurological:     General: No focal deficit present.     Mental Status: He is alert.     Sensory: Sensation is intact.     Motor: No weakness.     Coordination: Coordination is intact.     Gait: Gait is intact.  Psychiatric:        Mood and Affect: Mood normal.  Behavior: Behavior normal.    Assessment/Plan: Please see individual problem list.  Stroke, hemorrhagic (HCC) Assessment & Plan: BP stable today. Counseled on importance of mediation compliance. He will continue his current medication regimen. Has not had follow up with Neurology. Office called and appointment scheduled for patient on September 18, 2023. He will continue home PT, OT, and Speech Therapy. Encouraged follow up as scheduled.    Malignant hypertension Assessment & Plan: Chronic issue. Stable today in office. He will continue Norvasc 10 mg daily, Carvedilol 37.5 mg BID, Losartan 100 mg daily and Hydralazine 75 mg TID. Encouraged to continue monitoring blood pressure at home. Home health to contact if elevated. Will continue to monitor.     Return in about 3 months (around  12/05/2023) for Follow up.   Bethanie Dicker, NP-C [facility] [employer]

## 2023-09-11 ENCOUNTER — Other Ambulatory Visit: Payer: Self-pay

## 2023-09-11 ENCOUNTER — Telehealth: Payer: Self-pay

## 2023-09-11 DIAGNOSIS — I10 Essential (primary) hypertension: Secondary | ICD-10-CM

## 2023-09-11 MED ORDER — LOSARTAN POTASSIUM 100 MG PO TABS
100.0000 mg | ORAL_TABLET | Freq: Every day | ORAL | 3 refills | Status: DC
  Filled 2023-09-11: qty 90, 90d supply, fill #0

## 2023-09-11 MED ORDER — ATORVASTATIN CALCIUM 10 MG PO TABS: 10.0000 mg | ORAL_TABLET | Freq: Every day | ORAL | 3 refills | Status: DC

## 2023-09-11 MED ORDER — AMLODIPINE BESYLATE 10 MG PO TABS
10.0000 mg | ORAL_TABLET | Freq: Every day | ORAL | 3 refills | Status: DC
Start: 2023-09-11 — End: 2023-09-11
  Filled 2023-09-11: qty 90, 90d supply, fill #0

## 2023-09-11 MED ORDER — CARVEDILOL 12.5 MG PO TABS
37.5000 mg | ORAL_TABLET | Freq: Two times a day (BID) | ORAL | 3 refills | Status: DC
Start: 2023-09-11 — End: 2023-09-11
  Filled 2023-09-11: qty 180, 30d supply, fill #0

## 2023-09-11 MED ORDER — LOSARTAN POTASSIUM 100 MG PO TABS: 100.0000 mg | ORAL_TABLET | Freq: Every day | ORAL | 3 refills | Status: DC

## 2023-09-11 MED ORDER — ATORVASTATIN CALCIUM 10 MG PO TABS
10.0000 mg | ORAL_TABLET | Freq: Every day | ORAL | 3 refills | Status: DC
  Filled 2023-09-11: qty 90, 90d supply, fill #0

## 2023-09-11 MED ORDER — AMLODIPINE BESYLATE 10 MG PO TABS: 10.0000 mg | ORAL_TABLET | Freq: Every day | ORAL | 3 refills | Status: DC

## 2023-09-11 MED ORDER — HYDRALAZINE HCL 25 MG PO TABS
75.0000 mg | ORAL_TABLET | Freq: Three times a day (TID) | ORAL | 3 refills | Status: DC
  Filled 2023-09-11: qty 270, 30d supply, fill #0

## 2023-09-11 MED ORDER — HYDRALAZINE HCL 25 MG PO TABS: 75.0000 mg | ORAL_TABLET | Freq: Three times a day (TID) | ORAL | 3 refills | Status: DC

## 2023-09-11 MED ORDER — CARVEDILOL 12.5 MG PO TABS: 37.5000 mg | ORAL_TABLET | Freq: Two times a day (BID) | ORAL | 3 refills | Status: DC

## 2023-09-11 NOTE — Addendum Note (Signed)
Addended by: Donavan Foil on: 09/11/2023 04:16 PM   Modules accepted: Orders

## 2023-09-11 NOTE — Telephone Encounter (Signed)
Pt son called asking about the FMLA forms and medication-carvedilol, amlodipine and losartan

## 2023-09-11 NOTE — Telephone Encounter (Signed)
A faxed was received from pts son FMLA forms that he would like filled out to fly down here to help pt out. He typed a whole letter.   We informed Mr. Tippens at his appt that we would not be able to fill out his son who is out of states fmla forms.   Forms and letter has been placed in provider to be signed folder.    Pt son included contact phone number and email.

## 2023-09-11 NOTE — Addendum Note (Signed)
Addended by: Donavan Foil on: 09/11/2023 04:55 PM   Modules accepted: Orders

## 2023-09-11 NOTE — Telephone Encounter (Signed)
Patient's son, Mee Hives, called to state he would like to know if it's too late to change the pharmacy we will be sending patient's refills to from [facility] to [facility] on [address], which is closer to patient.

## 2023-09-11 NOTE — Addendum Note (Signed)
Addended by: Donavan Foil on: 09/11/2023 04:03 PM   Modules accepted: Orders

## 2023-09-12 ENCOUNTER — Other Ambulatory Visit: Payer: Self-pay

## 2023-09-15 ENCOUNTER — Telehealth: Payer: Self-pay

## 2023-09-15 ENCOUNTER — Other Ambulatory Visit: Payer: Self-pay

## 2023-09-15 NOTE — Telephone Encounter (Signed)
Plan of Care forms were received for provider signature. Forms have been placed in provider to be signed folder

## 2023-09-15 NOTE — Telephone Encounter (Signed)
error 

## 2023-09-16 DIAGNOSIS — F1010 Alcohol abuse, uncomplicated: Secondary | ICD-10-CM

## 2023-09-16 DIAGNOSIS — E785 Hyperlipidemia, unspecified: Secondary | ICD-10-CM | POA: Diagnosis not present

## 2023-09-16 DIAGNOSIS — F32A Depression, unspecified: Secondary | ICD-10-CM

## 2023-09-16 DIAGNOSIS — I129 Hypertensive chronic kidney disease with stage 1 through stage 4 chronic kidney disease, or unspecified chronic kidney disease: Secondary | ICD-10-CM | POA: Diagnosis not present

## 2023-09-16 DIAGNOSIS — N1832 Chronic kidney disease, stage 3b: Secondary | ICD-10-CM | POA: Diagnosis not present

## 2023-09-16 DIAGNOSIS — I69351 Hemiplegia and hemiparesis following cerebral infarction affecting right dominant side: Secondary | ICD-10-CM | POA: Diagnosis not present

## 2023-09-16 DIAGNOSIS — M1990 Unspecified osteoarthritis, unspecified site: Secondary | ICD-10-CM

## 2023-09-16 NOTE — Telephone Encounter (Signed)
Forms have been faxed to [phone] with a completed transmission log

## 2023-09-16 NOTE — Telephone Encounter (Signed)
Left detailed VM on son Jiles Prows phone [phone] to [facility] in regards to [facility] paper work needed a good return fax for forms.

## 2023-09-17 NOTE — Telephone Encounter (Signed)
Fax number [phone]

## 2023-09-17 NOTE — Telephone Encounter (Signed)
Forms have been faxed to the proided fax number [phone] with a completed transmission log

## 2023-09-17 NOTE — Telephone Encounter (Signed)
2nd attempt to call Elijah did not leave a 2nd vm

## 2023-09-18 NOTE — Telephone Encounter (Signed)
Forms have been faxed back to [phone] with a completed transmission log

## 2023-09-18 NOTE — Telephone Encounter (Signed)
Called and spoke with Edwin Green  in regards to what was needed on the forms I informed him we still have a copy in office of FMLA forms and that the missing info will be added and re faxed to his given fax number

## 2023-09-18 NOTE — Telephone Encounter (Signed)
Pt son called stating they are some missing information on the FMLA form that his employer need like the certificate number. Pt son would like to be called

## 2023-11-17 ENCOUNTER — Telehealth: Payer: Self-pay

## 2023-11-17 NOTE — Telephone Encounter (Signed)
[employer] forms that need clarification have been placed in provider to be signed folder

## 2024-01-28 ENCOUNTER — Telehealth: Payer: Self-pay

## 2024-01-28 NOTE — Telephone Encounter (Signed)
Copied from CRM [id number]. Topic: Clinical - Medical Advice >> Jan 28, 2024  4:21 PM Tiffany H wrote: Reason for CRM: Patient called to request help with disability paperwork. Patient would like to know how to go about expressing how difficult his day to day life   Patient may need help with transportation. Patient advised that he has lost signficant strength in his left arm. He has lost speed and mobility. He is too slow to return to the work. He is very discouraged and needs support from disability to keep going with his recovery.  Please assist.

## 2024-01-29 ENCOUNTER — Telehealth: Payer: Self-pay

## 2024-01-29 NOTE — Telephone Encounter (Signed)
Called pt but VM box is not set up unable to leave a VM.   Provider would like pt to know that she cannot help with disability that his son and attorney can fill the forms out and that Neurology should really be in control of the disability. Provider also signed FMLA paperwork for pts son to be reliable transportation as well which has been documented in pts chart.

## 2024-01-29 NOTE — Telephone Encounter (Signed)
VM left on Edwin Green's phone to CB   Edwin Green provided Edwin Green's phone number and it has been updated to pts chart contacts.    Called [employer] to inform that Edwin Green updated his insurance to [place] and to try and get him scheduled they informed me that they are not in network with [facility] and that Edwin Green would need to reach out to the [place] for them to submit a referral to them.   Called and updated Edwin Green about this and informed him I would try and update his Son [name] in regards to this.

## 2024-01-29 NOTE — Telephone Encounter (Signed)
Called pt and informed pt that Neurology should have a handle in disability forms since he is needing disability for his stroke.    Pts stated he has been going back and forth with [employer] about paper work and they informed him to drop forms off. Pt last officer visit with [employer] was 09-18-23. I informed him that I would call them and see about getting him scheduled.    Called [employer] to get him scheduled and they informed me they need to know what insurance he is using because if he is using [employer] he will need to go through the [place] to have forms sent for the allotted amount of times he can be seen by them.    Informed  pt what [employer] informed me and he stated he was fired and had letters from [employer] and he is not sure if Monia Pouch is working. I informed pt that he would have to call [employer] to see if the insurance is active and if they will cover his visits if not he needs to reach out tot he [place].

## 2024-01-29 NOTE — Telephone Encounter (Signed)
Copied from CRM [id number]. Topic: General - Other >> Jan 29, 2024  1:17 PM Sim Boast F wrote: Reason for CRM: Patient called to give Alvino Blood an update, his insurance Human resources officer) is updated in EPIC and ACTIVE - would like a call back regarding his appointment with [facility] for rehab.

## 2024-01-29 NOTE — Telephone Encounter (Signed)
Info docu in other phone note

## 2024-01-30 NOTE — Telephone Encounter (Signed)
FYI If [facility] called back please inform them the message is given down below why the CMA was calling the pt.Edwin Green

## 2024-01-30 NOTE — Telephone Encounter (Signed)
3rd attempt to contact son , so I called pt to check in to see if he was able to get in contact with the [facility] he stated he was able to inform his son and that he got up with the [place] but that they will reach back out to him sometime next week. I informed him once he has the [place] covering his neurology he can give [place] a call and I will reach out to [employer] to get him scheduled if I need to.

## 2024-03-02 ENCOUNTER — Ambulatory Visit: Payer: 59 | Admitting: Physical Therapy

## 2024-03-16 ENCOUNTER — Telehealth: Payer: Self-pay

## 2024-03-16 NOTE — Telephone Encounter (Signed)
 Copied from CRM [id number]. Topic: Clinical - Prescription Issue >> Mar 16, 2024 12:04 PM Adaysia C wrote: Reason for CRM: Marlana Salvage with [employer] called to request for the provider to submit a prior authorization for the EA:[id number] (APRESOLINE) 25 MG tablet because the patients insurance will not cover the dose frequency with out a prior authorization; please follow up with patients insurance Phone#:[phone] Fax#:[phone]

## 2024-03-17 ENCOUNTER — Encounter: Payer: Self-pay | Admitting: Nurse Practitioner

## 2024-03-17 ENCOUNTER — Ambulatory Visit (INDEPENDENT_AMBULATORY_CARE_PROVIDER_SITE_OTHER): Admitting: Nurse Practitioner

## 2024-03-17 ENCOUNTER — Telehealth: Payer: Self-pay

## 2024-03-17 VITALS — BP 120/62 | HR 58 | Temp 97.7°F | Ht 73.0 in | Wt 193.8 lb

## 2024-03-17 DIAGNOSIS — F32A Depression, unspecified: Secondary | ICD-10-CM

## 2024-03-17 DIAGNOSIS — N1832 Chronic kidney disease, stage 3b: Secondary | ICD-10-CM | POA: Diagnosis not present

## 2024-03-17 DIAGNOSIS — I10 Essential (primary) hypertension: Secondary | ICD-10-CM | POA: Diagnosis not present

## 2024-03-17 DIAGNOSIS — F419 Anxiety disorder, unspecified: Secondary | ICD-10-CM | POA: Diagnosis not present

## 2024-03-17 DIAGNOSIS — Z8673 Personal history of transient ischemic attack (TIA), and cerebral infarction without residual deficits: Secondary | ICD-10-CM | POA: Diagnosis not present

## 2024-03-17 DIAGNOSIS — I422 Other hypertrophic cardiomyopathy: Secondary | ICD-10-CM

## 2024-03-17 LAB — CBC WITH DIFFERENTIAL/PLATELET
Basophils Absolute: 0.1 10*3/uL (ref 0.0–0.1)
Basophils Relative: 2.1 % (ref 0.0–3.0)
Eosinophils Absolute: 0.2 10*3/uL (ref 0.0–0.7)
Eosinophils Relative: 4.2 % (ref 0.0–5.0)
HCT: 39 % (ref 39.0–52.0)
Hemoglobin: 13 g/dL (ref 13.0–17.0)
Lymphocytes Relative: 27.1 % (ref 12.0–46.0)
Lymphs Abs: 1.1 10*3/uL (ref 0.7–4.0)
MCHC: 33.3 g/dL (ref 30.0–36.0)
MCV: 90.7 fl (ref 78.0–100.0)
Monocytes Absolute: 0.3 10*3/uL (ref 0.1–1.0)
Monocytes Relative: 8.1 % (ref 3.0–12.0)
Neutro Abs: 2.3 10*3/uL (ref 1.4–7.7)
Neutrophils Relative %: 58.5 % (ref 43.0–77.0)
Platelets: 208 10*3/uL (ref 150.0–400.0)
RBC: 4.3 Mil/uL (ref 4.22–5.81)
RDW: 14.3 % (ref 11.5–15.5)
WBC: 3.9 10*3/uL — ABNORMAL LOW (ref 4.0–10.5)

## 2024-03-17 LAB — COMPREHENSIVE METABOLIC PANEL WITH GFR
ALT: 19 U/L (ref 0–53)
AST: 14 U/L (ref 0–37)
Albumin: 4.2 g/dL (ref 3.5–5.2)
Alkaline Phosphatase: 73 U/L (ref 39–117)
BUN: 22 mg/dL (ref 6–23)
CO2: 22 meq/L (ref 19–32)
Calcium: 8.8 mg/dL (ref 8.4–10.5)
Chloride: 107 meq/L (ref 96–112)
Creatinine, Ser: 1.62 mg/dL — ABNORMAL HIGH (ref 0.40–1.50)
GFR: 45.89 mL/min — ABNORMAL LOW (ref >60.00)
Glucose, Bld: 107 mg/dL — ABNORMAL HIGH (ref 70–99)
Potassium: 4 meq/L (ref 3.5–5.1)
Sodium: 138 meq/L (ref 135–145)
Total Bilirubin: 0.4 mg/dL (ref 0.2–1.2)
Total Protein: 6.9 g/dL (ref 6.0–8.3)

## 2024-03-17 LAB — LIPID PANEL
Cholesterol: 110 mg/dL (ref 0–200)
HDL: 41 mg/dL (ref >39.00)
LDL Cholesterol: 62 mg/dL (ref 0–99)
NonHDL: 68.76
Total CHOL/HDL Ratio: 3
Triglycerides: 36 mg/dL (ref 0.0–149.0)
VLDL: 7.2 mg/dL (ref 0.0–40.0)

## 2024-03-17 LAB — HEMOGLOBIN A1C: Hgb A1c MFr Bld: 5.7 % (ref 4.6–6.5)

## 2024-03-17 LAB — TSH: TSH: 3.73 u[IU]/mL (ref 0.35–5.50)

## 2024-03-17 MED ORDER — HYDRALAZINE HCL 25 MG PO TABS: 75.0000 mg | ORAL_TABLET | Freq: Three times a day (TID) | ORAL | 3 refills | Status: DC

## 2024-03-17 MED ORDER — ATORVASTATIN CALCIUM 10 MG PO TABS: 10.0000 mg | ORAL_TABLET | Freq: Every day | ORAL | 3 refills | Status: AC

## 2024-03-17 NOTE — Progress Notes (Unsigned)
 Bethanie Dicker, NP-C Phone: [phone]  Edwin Green is a 63 y.o. male who presents today for follow up.  Discussed the use of AI scribe software for clinical note transcription with the patient, who gave verbal consent to proceed.  History of Present Illness   Edwin Green is a 63 year old male who presents with difficulties in functional capacity following a stroke in August and medication management.  He has been experiencing episodes of lightheadedness and dizziness with exertion, which have impacted his ability to work, leading to an ambulance call on one occasion. He attempted to work but was unable to complete a full day due to these symptoms. No chest pain, shortness of breath, or swelling, but dizziness with activity is noted.  He has faced challenges in obtaining disability or unemployment benefits and was terminated from one of his jobs despite being on FMLA, contributing to stress and anxiety.  He has been seen by neurology since his last visit in September and has an upcoming appointment with them on the 20th of this month. He has not yet seen cardiology, although referrals have been placed by both neurology and his primary care.  He is currently taking Norvasc (amlodipine) 10 mg once daily, losartan 100 mg once daily, Coreg (carvedilol) 37.5 mg twice daily, and hydralazine 75 mg three times a day. He recently discovered he was approved for Medicaid back in October, which he has started using for his prescriptions. He is also currently taking Lipitor (atorvastatin).  He completed physical therapy but experiences exhaustion when bending or performing low-level activities, which he attributes to deconditioning from being inactive for a period.  His mood is 'a little bit going towards the downside' but acknowledges positive developments. He is considering whether he might benefit from speaking with a counselor.      Social History   Tobacco Use  Smoking Status Some Days    Types: Cigarettes  Smokeless Tobacco Never    Current Outpatient Medications on File Prior to Visit  Medication Sig Dispense Refill   amLODipine (NORVASC) 10 MG tablet Take 1 tablet (10 mg total) by mouth daily. 90 tablet 3   carvedilol (COREG) 12.5 MG tablet Take 3 tablets (37.5 mg total) by mouth 2 (two) times daily with a meal. 540 tablet 3   losartan (COZAAR) 100 MG tablet Take 1 tablet (100 mg total) by mouth daily. 90 tablet 3   No current facility-administered medications on file prior to visit.     ROS see history of present illness  Objective  Physical Exam Vitals:   03/17/24 0808  BP: 120/62  Pulse: (!) 58  Temp: 97.7 F (36.5 C)  SpO2: 96%    BP Readings from Last 3 Encounters:  03/17/24 120/62  09/05/23 130/88  08/09/23 (!) 166/96   Wt Readings from Last 3 Encounters:  03/17/24 193 lb 12.8 oz (87.9 kg)  09/05/23 182 lb 3.2 oz (82.6 kg)  08/09/23 185 lb 10 oz (84.2 kg)    Physical Exam Constitutional:      General: He is not in acute distress.    Appearance: Normal appearance.  HENT:     Head: Normocephalic.  Cardiovascular:     Rate and Rhythm: Normal rate and regular rhythm.     Heart sounds: Normal heart sounds.  Pulmonary:     Effort: Pulmonary effort is normal.     Breath sounds: Normal breath sounds.  Skin:    General: Skin is warm and dry.  Neurological:  General: No focal deficit present.     Mental Status: He is alert.  Psychiatric:        Mood and Affect: Mood normal.        Behavior: Behavior normal.     Assessment/Plan: Please see individual problem list.  History of CVA (cerebrovascular accident) Assessment & Plan: He is experiencing difficulty returning to work due to lightheadedness and exhaustion during physical activities. A functional capacity evaluation is planned to assess his work ability, with a neurology follow-up scheduled for April 04, 2024. Suspect some of his exhaustion and dizziness are due to deconditioning  from inactivity. He has completed physical therapy and is encouraged to gradually increase physical activity, such as walking. Continue current medication regiment.   Orders: -     Atorvastatin Calcium; Take 1 tablet (10 mg total) by mouth at bedtime.  Dispense: 90 tablet; Refill: 3 -     Lipid panel -     Ambulatory referral to Cardiology  Malignant hypertension Assessment & Plan: His hypertension is managed with amlodipine 10 mg daily, losartan 100 mg daily, carvedilol 37.5 mg twice daily, and hydralazine 75 mg three times daily, and his blood pressure is well-controlled. Adjust the hydralazine prescription to 50 mg and 25 mg tablets, taken together three times a day, ensuring a 90-day supply. He should check his blood pressure regularly at home.  Orders: -     CBC with Differential/Platelet -     Ambulatory referral to Cardiology -     hydrALAZINE HCl; Take 1 tablet (25 mg total) by mouth 3 (three) times daily. Take with 50 mg tablet to equal 75 mg  Dispense: 270 tablet; Refill: 3 -     hydrALAZINE HCl; Take 1 tablet (50 mg total) by mouth 3 (three) times daily. Take with 25 mg tablet to equal 75 mg  Dispense: 270 tablet; Refill: 3  Anxiety and depression Assessment & Plan: He is experiencing a decline in mood with anxiety and depression related to health and employment. Although not interested in medication, he is open to counseling. Offer a referral to behavioral health for counseling if desired, and monitor his mood, considering medication if symptoms worsen. He will contact if he decides he would like to see a therapist/counselor.   Orders: -     TSH  Chronic renal impairment, stage 3b (HCC) -     Comprehensive metabolic panel with GFR -     Hemoglobin A1c  Hypertrophic cardiomyopathy (HCC) -     Ambulatory referral to Cardiology    Return in about 6 months (around 09/16/2024) for Follow up.   Bethanie Dicker, NP-C [facility] - [facility]

## 2024-03-17 NOTE — Telephone Encounter (Signed)
 Copied from CRM [id number]. Topic: Medical Record Request - Other >> Mar 17, 2024 10:51 AM Deaijah H wrote: Reason for CRM: Patient's son Would like to ask Dr. Konrad Dolores if it's possible to get a statement of all his medical problem and what she think his restriction would be far as working and what he can and can't do throughout the day until he get his neurology appointment set. If needed, Please [phone] (son) if unable to get in touch he stated Earleen Reaper is okay

## 2024-03-18 ENCOUNTER — Other Ambulatory Visit (HOSPITAL_COMMUNITY): Payer: Self-pay

## 2024-03-18 ENCOUNTER — Encounter: Payer: Self-pay | Admitting: Nurse Practitioner

## 2024-03-18 ENCOUNTER — Telehealth: Payer: Self-pay

## 2024-03-18 MED ORDER — HYDRALAZINE HCL 25 MG PO TABS: 25.0000 mg | ORAL_TABLET | Freq: Three times a day (TID) | ORAL | 3 refills | Status: AC

## 2024-03-18 MED ORDER — HYDRALAZINE HCL 50 MG PO TABS: 50.0000 mg | ORAL_TABLET | Freq: Three times a day (TID) | ORAL | 3 refills | Status: AC

## 2024-03-18 NOTE — Assessment & Plan Note (Addendum)
 He is experiencing difficulty returning to work due to lightheadedness and exhaustion during physical activities. A functional capacity evaluation is planned to assess his work ability, with a neurology follow-up scheduled for April 04, 2024. Suspect some of his exhaustion and dizziness are due to deconditioning from inactivity. He has completed physical therapy and is encouraged to gradually increase physical activity, such as walking. Continue current medication regiment.

## 2024-03-18 NOTE — Assessment & Plan Note (Addendum)
 He is experiencing a decline in mood with anxiety and depression related to health and employment. Although not interested in medication, he is open to counseling. Offer a referral to behavioral health for counseling if desired, and monitor his mood, considering medication if symptoms worsen. He will contact if he decides he would like to see a therapist/counselor.

## 2024-03-18 NOTE — Assessment & Plan Note (Signed)
 His hypertension is managed with amlodipine 10 mg daily, losartan 100 mg daily, carvedilol 37.5 mg twice daily, and hydralazine 75 mg three times daily, and his blood pressure is well-controlled. Adjust the hydralazine prescription to 50 mg and 25 mg tablets, taken together three times a day, ensuring a 90-day supply. He should check his blood pressure regularly at home.

## 2024-03-18 NOTE — Telephone Encounter (Addendum)
 Insurance has a quantity limit of 8 tablets per day for 25mg , and a quantity limit of 4 tablets daily of 50mg . Per PA, medication is covered. Script may need to be broken into a combination of multiple strengths in order to achieve continued use of current dose

## 2024-03-18 NOTE — Telephone Encounter (Signed)
 Pharmacy Patient Advocate Encounter   Received notification from Pt Calls Messages that prior authorization for hydrALAZINE 25 MG tablet is required/requested.   Insurance verification completed.   The patient is insured through  [employer] care  .   Per test claim:   Insurance has a quantity limit of 8 tablets per day for 25mg , and a quantity limit of 4 tablets daily of 50mg . Per PA, medication is covered. Script may need to be broken into a combination of multiple strengths in order to achieve continued use of current dose    This has been communicated with office in original request

## 2024-03-22 DIAGNOSIS — H5213 Myopia, bilateral: Secondary | ICD-10-CM | POA: Diagnosis not present

## 2024-04-13 ENCOUNTER — Ambulatory Visit: Attending: Neurology | Admitting: Physical Therapy

## 2024-04-13 ENCOUNTER — Encounter: Payer: Self-pay | Admitting: Physical Therapy

## 2024-04-13 DIAGNOSIS — Z8673 Personal history of transient ischemic attack (TIA), and cerebral infarction without residual deficits: Secondary | ICD-10-CM | POA: Diagnosis present

## 2024-04-13 DIAGNOSIS — R5381 Other malaise: Secondary | ICD-10-CM | POA: Diagnosis present

## 2024-04-13 DIAGNOSIS — M6281 Muscle weakness (generalized): Secondary | ICD-10-CM | POA: Insufficient documentation

## 2024-04-13 DIAGNOSIS — M25511 Pain in right shoulder: Secondary | ICD-10-CM | POA: Diagnosis present

## 2024-04-13 DIAGNOSIS — G8929 Other chronic pain: Secondary | ICD-10-CM | POA: Insufficient documentation

## 2024-04-14 NOTE — Therapy (Signed)
 OUTPATIENT PHYSICAL THERAPY FUNCTIONAL CAPACITY EVALUATION  Patient Name: Edwin Green MRN: [id number] DOB:[birth year], 63 y.o., male Today's Date: 04/14/2024  END OF SESSION:  PT End of Session - 04/13/24 1237     Visit Number 1    Number of Visits 1    Date for PT Re-Evaluation 04/14/24    PT Start Time 1239    PT Stop Time 1423    PT Time Calculation (min) 104 min             Past Medical History:  Diagnosis Date   Arthritis    CKD (chronic kidney disease), stage II    Depression    Epistaxis    Hypertension    Hypertrophic cardiomyopathy (HCC)    a. TTE 7/20 - 60 to 65%, normal LV cavity size, severe concentric LVH with near cavity obliteration in systole, diastolic dysfunction, normal RV systolic function, normal RV cavity size, dilated aortic root measuring 4 cm, no significant valvular abnormality   Past Surgical History:  Procedure Laterality Date   NO PAST SURGERIES     Patient Active Problem List   Diagnosis Date Noted   History of CVA (cerebrovascular accident) 03/17/2024   Anxiety and depression 03/17/2024   Intracranial hemorrhage (HCC) 08/06/2023   Stroke, hemorrhagic (HCC) 08/05/2023   LVH (left ventricular hypertrophy) 02/19/2023   Chronic renal impairment, stage 3b (HCC) 02/18/2023   Epistaxis 02/17/2023   Alcohol use 02/17/2023   Hypertrophic cardiomyopathy (HCC)    Syncope 06/26/2019   AKI (acute kidney injury) (HCC) 06/26/2019   Malignant hypertension 06/24/2019   Nosebleed 06/24/2019   Hypertensive urgency 06/24/2019    PCP: Bluford Burkitt, NP  REFERRING PROVIDER: Bufford Carne, MD  REFERRING DIAG: H/o stroke  THERAPY DIAG:  History of stroke  Physical deconditioning  Muscle weakness (generalized)  Chronic right shoulder pain  Rationale for Evaluation and Treatment: Rehabilitation  ONSET DATE: 06/2023  SUBJECTIVE:                                                                                                                                                                                       SUBJECTIVE STATEMENT: See FCE Hand dominance: Right  PERTINENT HISTORY: See MD notes  PAIN:  Are you having pain? Yes: NPRS scale: 2/10 Pain location: R shoulder Pain description: aching Aggravating factors: lifting/ carrying Relieving factors: rest  PRECAUTIONS: None  RED FLAGS: None   WEIGHT BEARING RESTRICTIONS: No  FALLS:  Has patient fallen in last 6 months? No  LIVING ENVIRONMENT: Lives with: lives with an adult companion Lives in: House/apartment Has following equipment at home: None  OCCUPATION: See FCE  PLOF: Independent  PATIENT GOALS: Complete FCE  OBJECTIVE:   Name: Jaecion, Albini Date: 06/2023 Evaluation Date: 04/13/2024 Test Start, End, Duration: 12:39 PM, 2:23 PM, 1:43 hours Diagnosis: H/o stroke. Height, Weight: 6'1'', 193lb Starting BP, HR, Pain: 129/81, 55 bpm, Pain 0 out of 10 This report summarizes the results of the [employer] FCE Physical Work Animal nutritionist.  This evaluation is substantiated by reliability and validity research conducted at the [place] [facility] [place]  at [facility] and reported in the Journal of Occupational Medicine, September 1994   Overall Level of Work: Falls within the [employer] range.  Exerting up to 20 pounds of force occasionally, and/or up to 10 pounds of force frequently, and/or a negligible amount of force constantly (Constantly: activity or condition exist 2/3 or more of the time) to move objects.  Physical demand requirements are in excess of those for Sedentary Work.  Even though the weight lifted may be only a negligible amount, a job should be rated Light Work: (1) when it requires walking or standing to significant degree; or (2) when it requires sitting most of the time but entails pushing and/or pulling of arm or leg controls; and/or (3) when the job requires working at a production rate pace entailing the constant  pushing and/or pulling of materials even though the weight of those materials is negligible.  NOTE: The constant stress and strain of maintaining a production rate pace, especially in an industrial setting, can be and is physically demanding of a worker even though the amount of force exerted is negligible.  Please note that the dynamic strength/manual materials handling section of the report indicates a higher level of work than that determined by considering the client's performance on the entire test.  Our research shows that the safe, overall level of work is significantly influenced by non-materials handling (i.e. position tolerance and mobility) abilities.  To ignore these non-materials handling demands, negatively impacts the validity of the test.    Please see the Task Performance Table for specific abilities.    Tolerance for the 8-Hour Day: Please note that the client has limited standing and walking tolerance for the 8-hour day/40-hour week at the Light level of work. However, due to the self-limiting behavior on these tasks, this represents a minimal ability and not a maximum ability. A maximum ability could not be determined at this time due to the self-limiting behavior. Regardless of this self-limiting performance, the client will be able to alternate among standing and/or walking and other tasks noted in the task performance table to maximize work tolerance to the 8-hour day/40-hour week.       Self Limiting Behavior: The client self-limited on 20% of the 15 tasks.   Self-limiting behavior means that the client stopped the task before a maximum effort was reached.  Possible causes of self-limiting behavior include: (1) pain; (2) psychosocial issues such as fear of reinjury, anxiety, or depression; and/or (3) attempts to manipulate test results.  Although it is difficult to determine the causes of self-limiting behavior, our research indicates that motivated clients self-limit on no more than  20% of test items.  If the self-limiting exceeds 20%, then psychosocial and/or motivational factors are affecting test results.     Self Limiting < 20% of tasks = Within normal limits1  Self-Limiting 21% to 33% of tasks = Exceeds normal limits1  Self-Limiting > 33% of tasks = Significantly exceeds normal limits1 1When compared to a motivated group of patients who participated in research.    ADDITIONAL CLIENT STATEMENTS  ABOUT SELF-LIMITING BEHAVIOR The client's stated reasons for self-limiting behavior are listed beside each self-limited task in the Task Performance Summary section of this report.  In addition, the client made the following statements about self-limiting behavior during the evaluation:  increase lightheadedness/ fatigue  balance issues    SUBJECTIVE PAIN STATEMENTS The client made the following subjective pain statements during the test:  R shoulder pain (5/10) with overhead reaching/lifting tasks  These pain statements were consistent with the observed movement patterns.   PAIN BEHAVIORS AND THEIR IMPACT ON TEST RESULTS The client demonstrated the following pain behaviors during the test:  Slight R antalgic gait pattern with R LE shuffling pattern in hallway.  Pt. limited by c/o lightheadedness and "exhausted".  These pain behaviors were consistent with the observed movement patterns.  These pain behaviors correlated with the client's self-reported pain.  These pain behaviors did not affect test performance   FUNCTIONAL OUTCOME MEASURES Lower Extremity Functional Scale:  14 out of 80.     BODY MECHANICS AND MOVEMENT PATTERNS The client did not demonstrate safe body mechanics and movement patterns during the test.  Examples of the unsafe body mechanics and movement patterns are:  Limited knee flexion  Increase forward trunk flexion with floor to waist lifting.    Extra time to complete floor to waist lifting.   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  Pt. working part-time at [employer].  Pt. has not worked since last 06/2023.  Pt. was receiving PT/OT/SLP last year and ended in October 2024.  Pt. lives with roommate in house and shares responsibility.  Pt. is less physical than he has been in past.  Pt. reports pain in back/shoulder with increased activity and limited endurance.  Pt. has no income currently.  Pt. reports R sided muscle weakness and R antalgic gait.   MEDICATIONS  Medication see list    UPPER EXTREMITY FUNCTION Client has a diagnosis involving the upper extremities.  Ability to tolerate:  Handling Frequently  Fingering Frequently  Reaching at Waist Constantly  Reaching below Waist Constantly  Feeling Constantly Client's right hand is dominant.  BRIEF MUSCULOSKELETAL SCREEN  Cervical AROM: flexion and extension WFL, L rotn. 41 deg., R rotn. 39 deg., L lat. flexion 14 deg. (limited), R lat. flexion 20 deg.  B shoulder AROM WFL (slow, but controlled movement patterns with overhead reaching).  L shoulder/ elbow strength grossly 4+/5 MMT and L shoulder/ elbow grossly 4/5 MMT except bicep/tricep 4+/5 MMT.  B LE AROM WFL and strength grossly 5/5 MMT except hip flexion/ abduction/ extension 4+/5 MMT.  Good ankle AROM.  TEST LENGTH AND REST BREAKS The test lasted 1:43 hours.  Short pauses of 2-3 minutes between tasks occur while the evaluator is setting up equipment and documenting scores.  No additional rest breaks were taken.     TASK PERFORMANCE  Please note that the results noted below in red italics and marked SL are tasks on which the client self-limited.  On these self-limiting tasks, the performance represents a minimal rather than a maximal performance.  On these tasks we cannot determine a maximum performance. Tasks Client Performance 1 Self-Limiting Reasons Maximum Abilities 1 Floor to waist lift 22 lb Occas.  22 lb Occas. Waist to eye  level lift 15 lb Occas.  15 lb Occas. Two handed carrying 15 lb Occas.  15 lb Occas. Pushing 22 lb Occas. 3  22 lb Occas.  3 Pulling 24 lb Occas. 3  24 lb Occas. 3 Sitting Frequently   Frequently Standing Occasionally-SL  Fatigue ? Work arms over head-standing Occasionally  Occasionally Work bent over-standing/stooping Unable  Unable Work kneeling Unable  Unable Work squatting/crouching Unable  Unable Climbing stairs  Frequently  Frequently Repetitive squatting Occasionally-SL Fatigue ? Walking Occasionally-SL  Fatigue ? Climbing a ladder Unable   Unable Balance on level surfaces Adequate-SL Fatigue ? Balance on uneven surfaces Inadequate  Inadequate Balance on ladder Unable  Unable Balance on beam/scaffold Inadequate  Inadequate Forward Reaching Constantly  Constantly Grip Strength L44  R23 lb  L44  R23 lb  1 Occasionally = up to 1/3 of the day, Frequently = 1/3 to 2/3 of the day, Constantly = 2/3 to the full day.  Frequent lifting = 50% of Occasional; Constant lifting = 20%  of Occasional. 2 D.O.T. The aptitudes:  1 (90-100 percentile), 2 (67-89 percentile), 3 (34-66 percentile), 4 (11-33 percentile), 5 (0-10 percentile). 3 Pounds of force is the amount of force the client exerted during the pushing and pulling tasks.  If pushing or pulling is required for work, the force required for the task should be measured with a force gauge for comparison.         MAJOR AREAS OF DYSFUNCTION  Dynamic Strength  Position Tolerance  Mobility  Balance  FACTORS UNDERLYING PERFORMANCE  Decreased muscle strength in R UE/ B hips  Decreased range of motion in Cervical spine  Generalized de-conditioning  Pain in R shoulder  Generalized fatigue  EXIT INTERVIEW  There were no changes in musculoskeletal status from beginning to end.  Pain Score: 2/10 R shoulder  Gait pattern leaving the evaluation is similar as compared to gait pattern used upon arriving for test.  The client was driven to the  evaluation.   OBJECTIVE IMPAIRMENTS: Abnormal gait, decreased activity tolerance, decreased balance, decreased endurance, decreased mobility, difficulty walking, decreased ROM, decreased strength, impaired UE functional use, improper body mechanics, postural dysfunction, and pain.   ACTIVITY LIMITATIONS: carrying, lifting, bending, standing, squatting, and locomotion level  PARTICIPATION LIMITATIONS: occupation  PERSONAL FACTORS: Fitness and Past/current experiences are also affecting patient's functional outcome.   REHAB POTENTIAL: Good  CLINICAL DECISION MAKING: Evolving/moderate complexity  EVALUATION COMPLEXITY: Moderate   PLAN:  PT FREQUENCY: one time visit  PLANNED INTERVENTIONS: 97750- Physical Performance Testing, 97110-Therapeutic exercises, 97530- Therapeutic activity, [id number]- Neuromuscular re-education, and 40981- Self Care  PLAN FOR NEXT SESSION: FCE only  Lendell Quarry, PT, DPT # [phone] 04/14/2024, 7:26 PM

## 2024-04-17 NOTE — Progress Notes (Unsigned)
 Cardiology Office Note  Date:  04/19/2024   ID:  Edwin Green, DOB 1961-08-12, MRN [id number]  PCP:  Bluford Burkitt, NP   Chief Complaint  Patient presents with   New Patient (Initial Visit)    Ref by Bluford Burkitt, NP for an evaluation of his hypertrophic cardiomyopathy. Patient c/o feeling exhausted & shortness of breath with any amount of exertion.     HPI:  Mr. Bogar Dopson is a 63 year old man with past medical history of hypertrophic cardiomyopathy,  syncope,  CKD stage II, CR 1.6 uncontrolled hypertension,   epistaxis  hospital  [facility] from 7/11 through 06/29/19 for syncope Medication noncompliance CVA 8/24, difficulty with speech, intermittent confusion.  MRI head:thalamic bleed with surrounding edema and mild mass effect no underlying mass.  Who presents for new patient evaluation of his hypertrophic cardiomyopathy, hx of CVA   Last seen in our office August 2020  Reports no further syncope since 7/20 Had stopped taking BP medications 2021, "do it different" Nose bleed, BP was high, went back on medications Second nose bleed off medications  Limitations after CVA 8/24, not working  Has low stamina, energy, worse after CVA Rare smoking, rare etoh  Cardiac MRI 1. Normal LV function, LVEF = 68% 2. Severe asymmetric left ventricular hypertrophy. LV septal wall measures upto 22mm in thickness. 3. There is systolic anterior motion of mitral valve causing LVOT obstruction.  4. Mild LV lateral wall hypertrophy. 5. There is no late gadolinium enhancement in the left ventricular myocardium.  Echocardiogram August 2024 Normal LV function EF 60%, severe LVH, normal RV size and function  EKG personally reviewed by myself on todays visit EKG Interpretation Date/Time:  Monday Apr 19 2024 08:53:26 EDT Ventricular Rate:  56 PR Interval:  160 QRS Duration:  116 QT Interval:  448 QTC Calculation: 432 R Axis:   18  Text Interpretation: Sinus bradycardia Minimal voltage  criteria for LVH, may be normal variant ( Cornell product ) T wave abnormality, consider lateral ischemia When compared with ECG of 05-Aug-2023 12:38, QRS axis Shifted right Non-specific change in ST segment in Anterior leads Confirmed by Belva Boyden [phone]) on 04/19/2024 8:58:43 AM   Admitted to the hospital in early 06/2019 with syncope  Had donated blood prior to event hypertensive urgency and epistaxis.   Patient indicated at times had not taken his medication  discharged on amlodipine , lisinopril , and HCTZ.  He was discharged on 7/11.    at the pharmacy while standing at the checkout he had a syncopal episode and was brought back to [facility].   Echo on 06/27/2019 showed an EF of 60 to 65%, normal LV cavity size, severe concentric LVH with near cavity obliteration in systole, diastolic dysfunction, normal RV systolic function, normal RV cavity size, dilated aortic root measuring 4 cm, no significant valvular abnormality.    Lexiscan  Myoview  was low risk study.    volume depletion in the setting of previously undiagnosed hypertrophic cardiomyopathy with renal function indicating AKI.    avoid diuretics and vasodilators for hypertension.  transitioned to verapamil     PMH:   has a past medical history of Arthritis, CKD (chronic kidney disease), stage II, Depression, Epistaxis, Hypertension, and Hypertrophic cardiomyopathy (HCC).  PSH:    Past Surgical History:  Procedure Laterality Date   NO PAST SURGERIES      Current Outpatient Medications  Medication Sig Dispense Refill   amLODipine  (NORVASC ) 10 MG tablet Take 1 tablet (10 mg total) by mouth daily. 90 tablet 3  atorvastatin  (LIPITOR) 10 MG tablet Take 1 tablet (10 mg total) by mouth at bedtime. 90 tablet 3   carvedilol  (COREG ) 12.5 MG tablet Take 3 tablets (37.5 mg total) by mouth 2 (two) times daily with a meal. 540 tablet 3   hydrALAZINE  (APRESOLINE ) 25 MG tablet Take 1 tablet (25 mg total) by mouth 3 (three) times daily. Take with 50  mg tablet to equal 75 mg 270 tablet 3   hydrALAZINE  (APRESOLINE ) 50 MG tablet Take 1 tablet (50 mg total) by mouth 3 (three) times daily. Take with 25 mg tablet to equal 75 mg 270 tablet 3   losartan  (COZAAR ) 100 MG tablet Take 1 tablet (100 mg total) by mouth daily. 90 tablet 3   No current facility-administered medications for this visit.     Allergies:   Patient has no known allergies.   Social History:  The patient  reports that he has been smoking cigarettes. He started smoking about 20 years ago. He has a 5 pack-year smoking history. He has never used smokeless tobacco. He reports current alcohol use. He reports that he does not use drugs.   Family History:   family history includes Asthma in his son; Hypertension in his father and mother; Stroke in his father.    Review of Systems: Review of Systems  Constitutional:  Positive for malaise/fatigue.  HENT: Negative.    Respiratory: Negative.    Cardiovascular: Negative.   Gastrointestinal: Negative.   Musculoskeletal: Negative.   Neurological: Negative.   Psychiatric/Behavioral: Negative.    All other systems reviewed and are negative.   PHYSICAL EXAM: VS:  BP 100/60 (BP Location: Right Arm, Patient Position: Sitting, Cuff Size: Normal)   Pulse (!) 56   Ht 6\' 1"  (1.854 m)   Wt 182 lb 8 oz (82.8 kg)   SpO2 94%   BMI 24.08 kg/m  , BMI Body mass index is 24.08 kg/m. GEN: Well nourished, well developed, in no acute distress HEENT: normal Neck: no JVD, carotid bruits, or masses Cardiac: RRR; 1-2/6 SEM LSB,  no rubs, or gallops,no edema  Respiratory:  clear to auscultation bilaterally, normal work of breathing GI: soft, nontender, nondistended, + BS MS: no deformity or atrophy Skin: warm and dry, no rash Neuro:  Strength and sensation are intact Psych: euthymic mood, full affect  Recent Labs: 08/07/2023: Magnesium 2.1 03/17/2024: ALT 19; BUN 22; Creatinine, Ser 1.62; Hemoglobin 13.0; Platelets 208.0; Potassium 4.0; Sodium  138; TSH 3.73   Lipid Panel Lab Results  Component Value Date   CHOL 110 03/17/2024   HDL 41.00 03/17/2024   LDLCALC 62 03/17/2024   TRIG 36.0 03/17/2024    Wt Readings from Last 3 Encounters:  04/19/24 182 lb 8 oz (82.8 kg)  03/17/24 193 lb 12.8 oz (87.9 kg)  09/05/23 182 lb 3.2 oz (82.6 kg)     ASSESSMENT AND PLAN:  Problem List Items Addressed This Visit       Cardiology Problems   Malignant hypertension   Hypertrophic cardiomyopathy (HCC) - Primary   Relevant Orders   EKG 12-Lead (Completed)   LVH (left ventricular hypertrophy)   Relevant Orders   EKG 12-Lead (Completed)   Hypertensive urgency     Other   History of CVA (cerebrovascular accident)   Relevant Orders   EKG 12-Lead (Completed)   Anxiety and depression   Stroke, hemorrhagic (HCC)   Chronic renal impairment, stage 3b (HCC)   Syncope   Hypertrophic cardiomyopathy Long history of hypertrophic cardiomyopathy, minimally symptomatic Fatigue  likely unrelated to cardiac issue, seemed to present after stroke Blood pressure well-controlled Long history medication and clinic noncompliance Not a good candidate at this time for EP consultation for defibrillator  History of stroke In the setting of poorly controlled hypertension, medication noncompliance Reports he has had difficulty doing activities, working Blood pressure now well-controlled  Chronic renal sufficiency In the setting of chronic poorly controlled hypertension Creatinine stable 1.6   Signed, Juanda Noon, M.D., Ph.D. [facility] [place], [place] [phone]

## 2024-04-19 ENCOUNTER — Encounter: Payer: Self-pay | Admitting: Cardiovascular Disease

## 2024-04-19 ENCOUNTER — Ambulatory Visit: Attending: Cardiovascular Disease | Admitting: Cardiovascular Disease

## 2024-04-19 VITALS — BP 118/80 | HR 56 | Ht 73.0 in | Wt 182.5 lb

## 2024-04-19 DIAGNOSIS — I422 Other hypertrophic cardiomyopathy: Secondary | ICD-10-CM | POA: Diagnosis not present

## 2024-04-19 DIAGNOSIS — I160 Hypertensive urgency: Secondary | ICD-10-CM

## 2024-04-19 DIAGNOSIS — I517 Cardiomegaly: Secondary | ICD-10-CM | POA: Diagnosis not present

## 2024-04-19 DIAGNOSIS — R55 Syncope and collapse: Secondary | ICD-10-CM

## 2024-04-19 DIAGNOSIS — N1832 Chronic kidney disease, stage 3b: Secondary | ICD-10-CM

## 2024-04-19 DIAGNOSIS — F419 Anxiety disorder, unspecified: Secondary | ICD-10-CM

## 2024-04-19 DIAGNOSIS — Z8673 Personal history of transient ischemic attack (TIA), and cerebral infarction without residual deficits: Secondary | ICD-10-CM

## 2024-04-19 DIAGNOSIS — F32A Depression, unspecified: Secondary | ICD-10-CM

## 2024-04-19 DIAGNOSIS — I10 Essential (primary) hypertension: Secondary | ICD-10-CM | POA: Diagnosis not present

## 2024-04-19 DIAGNOSIS — I619 Nontraumatic intracerebral hemorrhage, unspecified: Secondary | ICD-10-CM

## 2024-04-19 NOTE — Patient Instructions (Addendum)
Medication Instructions:  No changes  If you need a refill on your cardiac medications before your next appointment, please call your pharmacy.   Lab work: No new labs needed  Testing/Procedures: No new testing needed  Follow-Up: At [facility], you and your health needs are our priority.  As part of our continuing mission to provide you with exceptional heart care, we have created designated Provider Care Teams.  These Care Teams include your primary Cardiologist (physician) and Advanced Practice Providers (APPs -  Physician Assistants and Nurse Practitioners) who all work together to provide you with the care you need, when you need it.  You will need a follow up appointment in 12 months, APP ok  Providers on your designated Care Team:   Murray Hodgkins, NP Christell Faith, PA-C Cadence Kathlen Mody, [place]  COVID-19 Vaccine Information can be found at: [email or web address] For questions related to vaccine distribution or appointments, please email [email or web address] or call [phone].

## 2024-06-22 ENCOUNTER — Ambulatory Visit: Admitting: Cardiovascular Disease

## 2024-07-13 ENCOUNTER — Other Ambulatory Visit: Payer: Self-pay | Admitting: Medical Genetics

## 2024-09-09 ENCOUNTER — Ambulatory Visit (INDEPENDENT_AMBULATORY_CARE_PROVIDER_SITE_OTHER): Admitting: Nurse Practitioner

## 2024-09-09 ENCOUNTER — Ambulatory Visit: Payer: Self-pay | Admitting: Nurse Practitioner

## 2024-09-09 VITALS — BP 140/98 | HR 52 | Temp 97.8°F | Ht 73.0 in | Wt 187.4 lb

## 2024-09-09 DIAGNOSIS — I422 Other hypertrophic cardiomyopathy: Secondary | ICD-10-CM | POA: Diagnosis not present

## 2024-09-09 DIAGNOSIS — F419 Anxiety disorder, unspecified: Secondary | ICD-10-CM | POA: Diagnosis not present

## 2024-09-09 DIAGNOSIS — Z8673 Personal history of transient ischemic attack (TIA), and cerebral infarction without residual deficits: Secondary | ICD-10-CM | POA: Diagnosis not present

## 2024-09-09 DIAGNOSIS — F32A Depression, unspecified: Secondary | ICD-10-CM

## 2024-09-09 DIAGNOSIS — I10 Essential (primary) hypertension: Secondary | ICD-10-CM | POA: Diagnosis not present

## 2024-09-09 LAB — BASIC METABOLIC PANEL WITH GFR
BUN: 19 mg/dL (ref 6–23)
CO2: 25 meq/L (ref 19–32)
Calcium: 9 mg/dL (ref 8.4–10.5)
Chloride: 107 meq/L (ref 96–112)
Creatinine, Ser: 1.58 mg/dL — ABNORMAL HIGH (ref 0.40–1.50)
GFR: 47.12 mL/min — ABNORMAL LOW (ref >60.00)
Glucose, Bld: 103 mg/dL — ABNORMAL HIGH (ref 70–99)
Potassium: 4.4 meq/L (ref 3.5–5.1)
Sodium: 140 meq/L (ref 135–145)

## 2024-09-09 MED ORDER — AMLODIPINE BESYLATE 10 MG PO TABS: 10.0000 mg | ORAL_TABLET | Freq: Every day | ORAL | 3 refills | Status: AC

## 2024-09-09 MED ORDER — LOSARTAN POTASSIUM 100 MG PO TABS: 100.0000 mg | ORAL_TABLET | Freq: Every day | ORAL | 3 refills | Status: AC

## 2024-09-09 MED ORDER — CARVEDILOL 12.5 MG PO TABS: 37.5000 mg | ORAL_TABLET | Freq: Two times a day (BID) | ORAL | 3 refills | Status: AC

## 2024-09-09 NOTE — Progress Notes (Signed)
 Leron Glance, NP-C Phone: [phone]  Edwin Green is a 63 y.o. male who presents today for follow up.   Discussed the use of AI scribe software for clinical note transcription with the patient, who gave verbal consent to proceed.  History of Present Illness   Edwin Green is a 63 year old male with hypertension and a history of stroke who presents for medication refills and follow-up.  He reports that his current medications are effective and he has no new problems or concerns. He regularly monitors his blood pressure at home, with readings typically around 120-130 mmHg. No chest pain, shortness of breath, dizziness, or swelling in his legs.  His current medication regimen includes amlodipine  10 mg, losartan  100 mg, carvedilol  37.5 mg twice a day, hydralazine  three times a day, and atorvastatin . He confirms adherence to his medication schedule and mentions he still has some medication left from previous prescriptions.  He has been following up with cardiology and neurology since his stroke. He notes an improvement in his mood, stating he has learned to accept what has happened, which has been a significant step for him.      Social History   Tobacco Use  Smoking Status Some Days   Current packs/day: 0.25   Average packs/day: 0.3 packs/day for 20.4 years (5.1 ttl pk-yrs)   Types: Cigarettes   Start date: 04/19/2004  Smokeless Tobacco Never    Current Outpatient Medications on File Prior to Visit  Medication Sig Dispense Refill   atorvastatin  (LIPITOR) 10 MG tablet Take 1 tablet (10 mg total) by mouth at bedtime. 90 tablet 3   hydrALAZINE  (APRESOLINE ) 25 MG tablet Take 1 tablet (25 mg total) by mouth 3 (three) times daily. Take with 50 mg tablet to equal 75 mg 270 tablet 3   hydrALAZINE  (APRESOLINE ) 50 MG tablet Take 1 tablet (50 mg total) by mouth 3 (three) times daily. Take with 25 mg tablet to equal 75 mg 270 tablet 3   No current facility-administered medications on file  prior to visit.     ROS see history of present illness  Objective  Physical Exam Vitals:   09/09/24 1121 09/09/24 1139  BP: 138/88 (!) 140/98  Pulse: (!) 52   Temp: 97.8 F (36.6 C)   SpO2: 99%     BP Readings from Last 3 Encounters:  09/09/24 (!) 140/98  04/19/24 118/80  03/17/24 120/62   Wt Readings from Last 3 Encounters:  09/09/24 187 lb 6.4 oz (85 kg)  04/19/24 182 lb 8 oz (82.8 kg)  03/17/24 193 lb 12.8 oz (87.9 kg)    Physical Exam Constitutional:      General: He is not in acute distress.    Appearance: Normal appearance.  HENT:     Head: Normocephalic.  Cardiovascular:     Rate and Rhythm: Normal rate and regular rhythm.     Heart sounds: Normal heart sounds.  Pulmonary:     Effort: Pulmonary effort is normal.     Breath sounds: Normal breath sounds.  Skin:    General: Skin is warm and dry.  Neurological:     General: No focal deficit present.     Mental Status: He is alert.  Psychiatric:        Mood and Affect: Mood normal.        Behavior: Behavior normal.      Assessment/Plan: Please see individual problem list.  Malignant hypertension Assessment & Plan: Hypertension is adequately controlled with current medications. Elevated today in  office, previously well controlled. Home blood pressure readings are consistently around 120-130 mmHg. Continue amlodipine  10 mg, losartan  100 mg, carvedilol  37.5 mg twice daily, and hydralazine  three times daily. Check lab work as outlined.   Orders: -     amLODIPine  Besylate; Take 1 tablet (10 mg total) by mouth daily.  Dispense: 90 tablet; Refill: 3 -     Carvedilol ; Take 3 tablets (37.5 mg total) by mouth 2 (two) times daily with a meal.  Dispense: 540 tablet; Refill: 3 -     Losartan  Potassium; Take 1 tablet (100 mg total) by mouth daily.  Dispense: 90 tablet; Refill: 3 -     Basic metabolic panel with GFR  Hypertrophic cardiomyopathy (HCC) Assessment & Plan: Continue current blood pressure medication  regimen. Follow up with Cardiology.    Anxiety and depression Assessment & Plan: Depression symptoms have improved. He reports better mood and acceptance of past events. Not on any medications. Encouraged to contact if worsening symptoms, unusual behavior changes or suicidal thoughts occur.    History of CVA (cerebrovascular accident) Assessment & Plan: Due to uncontrolled blood pressure. Continue atorvastatin  and current medication regimen. Counseled on importance of medication adherence. Follow up with Neurology as scheduled.       Return in about 6 months (around 03/09/2025) for Follow up.   Leron Glance, NP-C [facility]

## 2024-09-10 ENCOUNTER — Telehealth: Payer: Self-pay

## 2024-09-10 NOTE — Telephone Encounter (Signed)
 Detailed vm left on Edwin Green's phone informing him we do not have any paper work for [employer] for him as he did NOT accompany his father at his appt on 09-09-24 so no forms can be filled out until paper work is in hand, once forms are received provider will have 5-7 business days to complete any and all forms if she is comfortable filling said forms out.

## 2024-09-10 NOTE — Telephone Encounter (Signed)
 Copied from CRM #[id number]. Topic: Clinical - Medical Advice >> Sep 10, 2024  3:56 PM Anairis L wrote: Reason for CRM: Son Darilyn Louder would like to get paperwork filled out so he can continue being by his fathers side( FMLA). His paperwork will exp.   Please advise. [phone]

## 2024-09-16 ENCOUNTER — Ambulatory Visit: Admitting: Nurse Practitioner

## 2024-09-16 ENCOUNTER — Encounter: Payer: Self-pay | Admitting: Nurse Practitioner

## 2024-09-17 ENCOUNTER — Encounter: Payer: Self-pay | Admitting: Nurse Practitioner

## 2024-09-17 NOTE — Assessment & Plan Note (Signed)
 Hypertension is adequately controlled with current medications. Elevated today in office, previously well controlled. Home blood pressure readings are consistently around 120-130 mmHg. Continue amlodipine  10 mg, losartan  100 mg, carvedilol  37.5 mg twice daily, and hydralazine  three times daily. Check lab work as outlined.

## 2024-09-17 NOTE — Assessment & Plan Note (Signed)
 Depression symptoms have improved. He reports better mood and acceptance of past events. Not on any medications. Encouraged to contact if worsening symptoms, unusual behavior changes or suicidal thoughts occur.

## 2024-09-17 NOTE — Assessment & Plan Note (Addendum)
 Due to uncontrolled blood pressure. Continue atorvastatin  and current medication regimen. Counseled on importance of medication adherence. Follow up with Neurology as scheduled.

## 2024-09-17 NOTE — Assessment & Plan Note (Signed)
 Continue current blood pressure medication regimen. Follow up with Cardiology.

## 2024-10-05 ENCOUNTER — Other Ambulatory Visit: Payer: Self-pay | Admitting: Medical Genetics

## 2024-10-05 DIAGNOSIS — Z006 Encounter for examination for normal comparison and control in clinical research program: Secondary | ICD-10-CM

## 2024-10-13 DIAGNOSIS — Z0279 Encounter for issue of other medical certificate: Secondary | ICD-10-CM

## 2025-01-06 ENCOUNTER — Telehealth: Payer: Self-pay | Admitting: *Deleted

## 2025-01-06 NOTE — Telephone Encounter (Signed)
 Copied from CRM [id number]. Topic: Clinical - Medication Question >> Jan 06, 2025  1:19 PM Olam RAMAN wrote: Reason for CRM: Caller is checking on any meds that have been added. He provided med names but no new ones added Zetamide for cholestrol. Caller was at pharmacy and they advised son pt had a new med. Son will call us  back for any change

## 2025-01-06 NOTE — Telephone Encounter (Signed)
 Zetmide is not in pts med list

## 2025-03-08 ENCOUNTER — Ambulatory Visit: Admitting: Cardiovascular Disease

## 2025-03-09 ENCOUNTER — Ambulatory Visit: Admitting: Nurse Practitioner
